# Patient Record
Sex: Male | Born: 1940
Health system: Southern US, Community
[De-identification: ages and names within clinical notes are randomized; demographics above are authoritative.]

## PROBLEM LIST (undated history)

## (undated) DIAGNOSIS — B029 Zoster without complications: Secondary | ICD-10-CM

## (undated) DIAGNOSIS — E039 Hypothyroidism, unspecified: Secondary | ICD-10-CM

## (undated) DIAGNOSIS — I10 Essential (primary) hypertension: Secondary | ICD-10-CM

## (undated) DIAGNOSIS — K449 Diaphragmatic hernia without obstruction or gangrene: Secondary | ICD-10-CM

## (undated) DIAGNOSIS — K219 Gastro-esophageal reflux disease without esophagitis: Secondary | ICD-10-CM

## (undated) DIAGNOSIS — K5792 Diverticulitis of intestine, part unspecified, without perforation or abscess without bleeding: Secondary | ICD-10-CM

## (undated) DIAGNOSIS — E119 Type 2 diabetes mellitus without complications: Secondary | ICD-10-CM

## (undated) DIAGNOSIS — M1990 Unspecified osteoarthritis, unspecified site: Secondary | ICD-10-CM

## (undated) DIAGNOSIS — M47816 Spondylosis without myelopathy or radiculopathy, lumbar region: Secondary | ICD-10-CM

## (undated) DIAGNOSIS — J189 Pneumonia, unspecified organism: Secondary | ICD-10-CM

## (undated) DIAGNOSIS — G839 Paralytic syndrome, unspecified: Secondary | ICD-10-CM

## (undated) DIAGNOSIS — Z87442 Personal history of urinary calculi: Secondary | ICD-10-CM

## (undated) HISTORY — DX: Unspecified osteoarthritis, unspecified site: M19.90

## (undated) HISTORY — PX: INGUINAL HERNIA REPAIR: SUR1180

## (undated) HISTORY — PX: SPINE SURGERY: SHX786

## (undated) HISTORY — DX: Zoster without complications: B02.9

## (undated) HISTORY — DX: Type 2 diabetes mellitus without complications: E11.9

## (undated) HISTORY — DX: Spondylosis without myelopathy or radiculopathy, lumbar region: M47.816

## (undated) HISTORY — DX: Gastro-esophageal reflux disease without esophagitis: K21.9

## (undated) HISTORY — DX: Diverticulitis of intestine, part unspecified, without perforation or abscess without bleeding: K57.92

## (undated) HISTORY — DX: Personal history of urinary calculi: Z87.442

## (undated) HISTORY — DX: Hypothyroidism, unspecified: E03.9

## (undated) HISTORY — DX: Diaphragmatic hernia without obstruction or gangrene: K44.9

---
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
## 1998-01-14 ENCOUNTER — Other Ambulatory Visit: Admission: RE | Admit: 1998-01-14 | Discharge: 1998-01-14 | Payer: Self-pay | Admitting: General Surgery

## 1998-10-04 HISTORY — PX: APPENDECTOMY: SHX54

## 1999-01-26 ENCOUNTER — Ambulatory Visit (HOSPITAL_COMMUNITY): Admission: RE | Admit: 1999-01-26 | Discharge: 1999-01-26 | Payer: Self-pay | Admitting: *Deleted

## 2009-05-23 ENCOUNTER — Inpatient Hospital Stay (HOSPITAL_COMMUNITY): Admission: RE | Admit: 2009-05-23 | Discharge: 2009-05-25 | Payer: Self-pay | Admitting: Neurosurgery

## 2009-06-05 ENCOUNTER — Ambulatory Visit: Payer: Self-pay | Admitting: Surgery

## 2009-06-05 ENCOUNTER — Ambulatory Visit: Admission: RE | Admit: 2009-06-05 | Discharge: 2009-06-05 | Payer: Self-pay | Admitting: Orthopedic Surgery

## 2009-06-05 ENCOUNTER — Encounter (INDEPENDENT_AMBULATORY_CARE_PROVIDER_SITE_OTHER): Payer: Self-pay | Admitting: Neurosurgery

## 2009-06-09 ENCOUNTER — Emergency Department (HOSPITAL_COMMUNITY): Admission: EM | Admit: 2009-06-09 | Discharge: 2009-06-09 | Payer: Self-pay | Admitting: Emergency Medicine

## 2009-06-13 ENCOUNTER — Inpatient Hospital Stay (HOSPITAL_COMMUNITY): Admission: AD | Admit: 2009-06-13 | Discharge: 2009-06-17 | Payer: Self-pay | Admitting: Neurosurgery

## 2010-10-26 ENCOUNTER — Encounter: Payer: Self-pay | Admitting: Neurosurgery

## 2011-01-08 LAB — BASIC METABOLIC PANEL
BUN: 10 mg/dL (ref 6–23)
CO2: 29 meq/L (ref 19–32)
Calcium: 8.9 mg/dL (ref 8.4–10.5)
Chloride: 100 meq/L (ref 96–112)
Creatinine, Ser: 0.73 mg/dL (ref 0.4–1.5)
GFR calc Af Amer: >60 mL/min (ref >60)
GFR calc non Af Amer: >60 mL/min (ref >60)
Glucose, Bld: 131 mg/dL — ABNORMAL HIGH (ref 70–99)
Potassium: 4.4 mEq/L (ref 3.5–5.1)
Sodium: 137 mEq/L (ref 135–145)

## 2011-01-08 LAB — CBC
HCT: 39.8 % (ref 39.0–52.0)
Hemoglobin: 13.9 g/dL (ref 13.0–17.0)
MCHC: 34.8 g/dL (ref 30.0–36.0)
MCV: 95.5 fL (ref 78.0–100.0)
Platelets: 156 10*3/uL (ref 150–400)
RBC: 4.17 MIL/uL — ABNORMAL LOW (ref 4.22–5.81)
RDW: 13.4 % (ref 11.5–15.5)
WBC: 5 10*3/uL (ref 4.0–10.5)

## 2011-01-08 LAB — GLUCOSE, CAPILLARY
Glucose-Capillary: 144 mg/dL — ABNORMAL HIGH (ref 70–99)
Glucose-Capillary: 171 mg/dL — ABNORMAL HIGH (ref 70–99)
Glucose-Capillary: 178 mg/dL — ABNORMAL HIGH (ref 70–99)
Glucose-Capillary: 180 mg/dL — ABNORMAL HIGH (ref 70–99)
Glucose-Capillary: 180 mg/dL — ABNORMAL HIGH (ref 70–99)
Glucose-Capillary: 186 mg/dL — ABNORMAL HIGH (ref 70–99)
Glucose-Capillary: 193 mg/dL — ABNORMAL HIGH (ref 70–99)
Glucose-Capillary: 201 mg/dL — ABNORMAL HIGH (ref 70–99)
Glucose-Capillary: 204 mg/dL — ABNORMAL HIGH (ref 70–99)
Glucose-Capillary: 207 mg/dL — ABNORMAL HIGH (ref 70–99)
Glucose-Capillary: 222 mg/dL — ABNORMAL HIGH (ref 70–99)
Glucose-Capillary: 234 mg/dL — ABNORMAL HIGH (ref 70–99)
Glucose-Capillary: 236 mg/dL — ABNORMAL HIGH (ref 70–99)
Glucose-Capillary: 243 mg/dL — ABNORMAL HIGH (ref 70–99)
Glucose-Capillary: 246 mg/dL — ABNORMAL HIGH (ref 70–99)
Glucose-Capillary: 248 mg/dL — ABNORMAL HIGH (ref 70–99)

## 2011-01-08 LAB — C-REACTIVE PROTEIN: CRP: 0.5 mg/dL — ABNORMAL LOW (ref <0.6)

## 2011-01-08 LAB — DIFFERENTIAL
Basophils Absolute: 0 10*3/uL (ref 0.0–0.1)
Basophils Relative: 0 % (ref 0–1)
Eosinophils Absolute: 0.2 10*3/uL (ref 0.0–0.7)
Eosinophils Relative: 3 % (ref 0–5)
Lymphocytes Relative: 19 % (ref 12–46)
Lymphs Abs: 0.9 10*3/uL (ref 0.7–4.0)
Monocytes Absolute: 0.5 10*3/uL (ref 0.1–1.0)
Monocytes Relative: 9 % (ref 3–12)
Neutro Abs: 3.4 10*3/uL (ref 1.7–7.7)
Neutrophils Relative %: 68 % (ref 43–77)

## 2011-01-08 LAB — URIC ACID: Uric Acid, Serum: 3.5 mg/dL — ABNORMAL LOW (ref 4.0–7.8)

## 2011-01-08 LAB — SEDIMENTATION RATE: Sed Rate: 7 mm/h (ref 0–16)

## 2011-01-09 LAB — GLUCOSE, CAPILLARY
Glucose-Capillary: 102 mg/dL — ABNORMAL HIGH (ref 70–99)
Glucose-Capillary: 112 mg/dL — ABNORMAL HIGH (ref 70–99)
Glucose-Capillary: 130 mg/dL — ABNORMAL HIGH (ref 70–99)
Glucose-Capillary: 141 mg/dL — ABNORMAL HIGH (ref 70–99)
Glucose-Capillary: 141 mg/dL — ABNORMAL HIGH (ref 70–99)
Glucose-Capillary: 147 mg/dL — ABNORMAL HIGH (ref 70–99)
Glucose-Capillary: 148 mg/dL — ABNORMAL HIGH (ref 70–99)
Glucose-Capillary: 156 mg/dL — ABNORMAL HIGH (ref 70–99)
Glucose-Capillary: 164 mg/dL — ABNORMAL HIGH (ref 70–99)
Glucose-Capillary: 171 mg/dL — ABNORMAL HIGH (ref 70–99)
Glucose-Capillary: 184 mg/dL — ABNORMAL HIGH (ref 70–99)

## 2011-01-09 LAB — CBC
HCT: 40.4 % (ref 39.0–52.0)
Hemoglobin: 14.2 g/dL (ref 13.0–17.0)
MCHC: 35.2 g/dL (ref 30.0–36.0)
MCV: 94.6 fL (ref 78.0–100.0)
Platelets: 174 10*3/uL (ref 150–400)
RBC: 4.28 MIL/uL (ref 4.22–5.81)
RDW: 13.8 % (ref 11.5–15.5)
WBC: 4.5 10*3/uL (ref 4.0–10.5)

## 2011-01-09 LAB — BASIC METABOLIC PANEL
BUN: 11 mg/dL (ref 6–23)
CO2: 27 mEq/L (ref 19–32)
Calcium: 9.3 mg/dL (ref 8.4–10.5)
Chloride: 106 mEq/L (ref 96–112)
Creatinine, Ser: 0.91 mg/dL (ref 0.4–1.5)
GFR calc Af Amer: >60 mL/min (ref >60)
GFR calc non Af Amer: >60 mL/min (ref >60)
Glucose, Bld: 104 mg/dL — ABNORMAL HIGH (ref 70–99)
Potassium: 4.7 mEq/L (ref 3.5–5.1)
Sodium: 140 meq/L (ref 135–145)

## 2011-02-12 IMAGING — CR DG CHEST 2V
2 series · 2 of 2 positions shown · non-contrast
Comparison: None

CLINICAL DATA: Preoperative respiratory exam for spinal surgery.
Hypertension.  Diabetes.

CHEST - 2 VIEW

[view not recorded (1 of 2)]
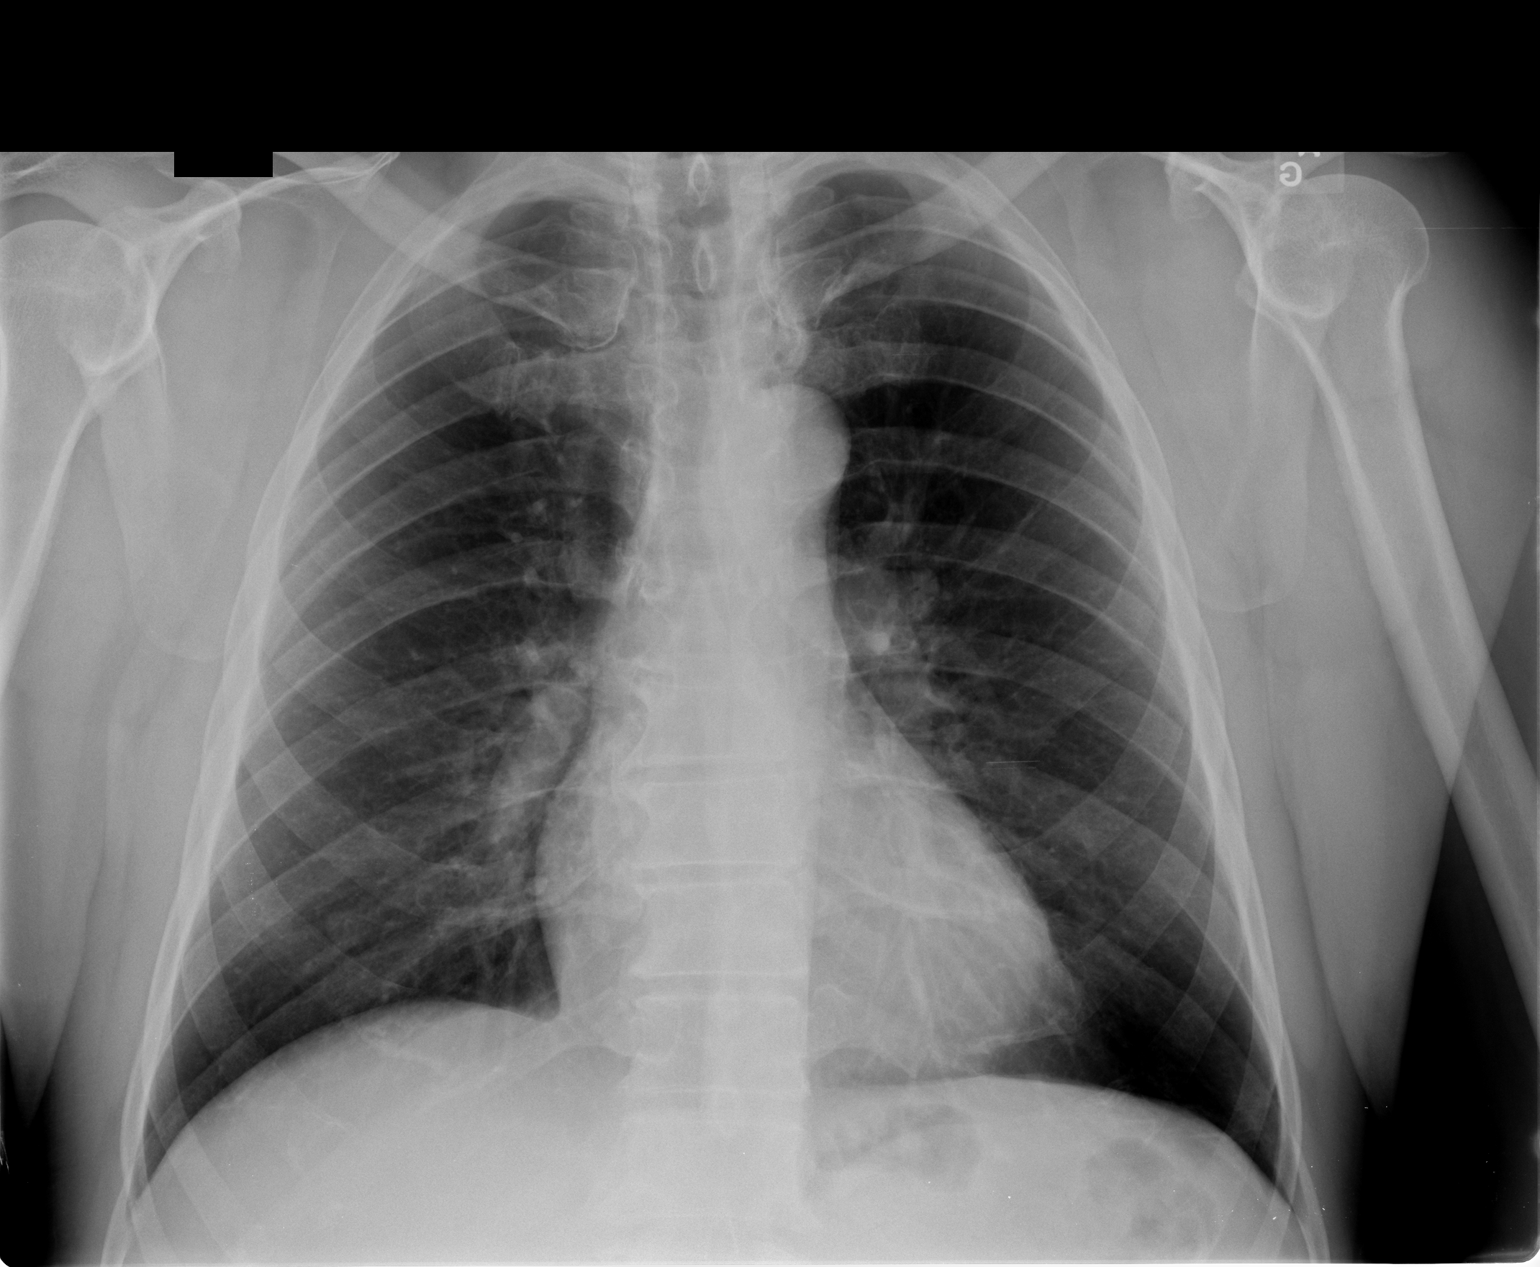

[view not recorded (2 of 2)]
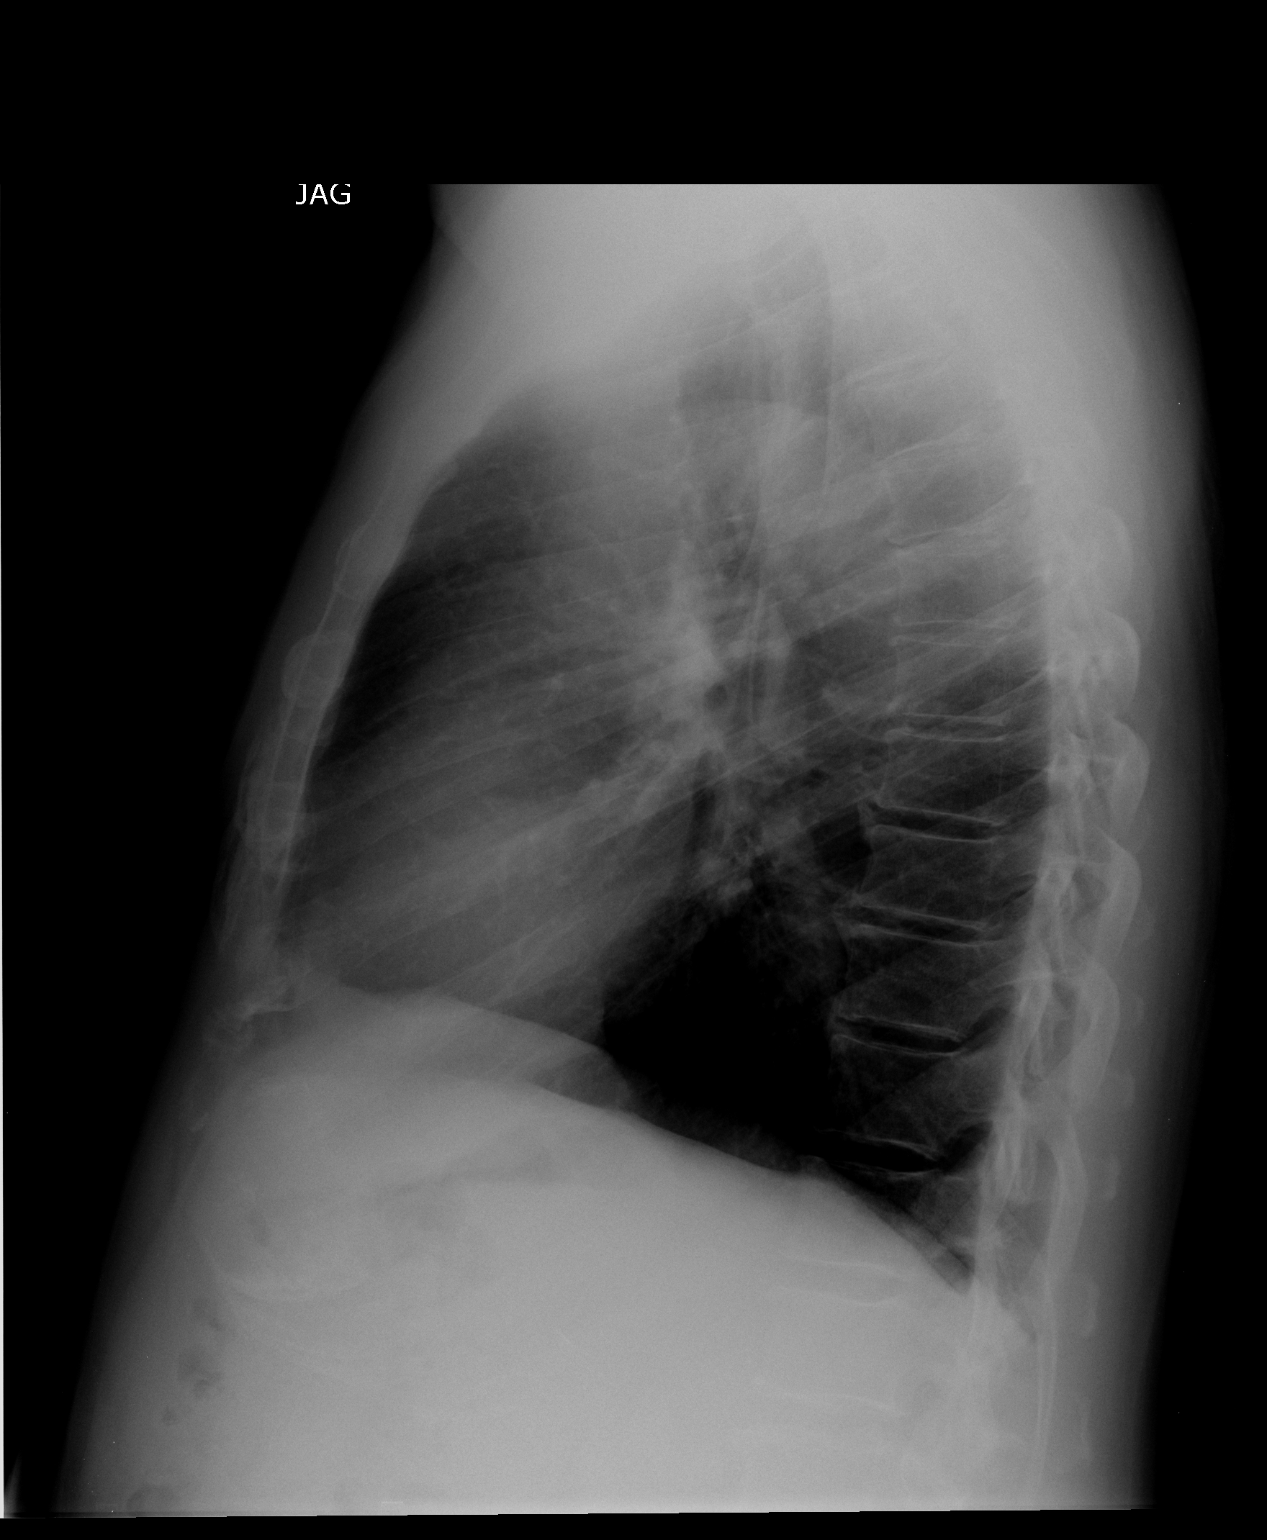

[2 of 2 positions shown; findings below may reference images not displayed]

FINDINGS: Heart size is normal.  The mediastinum is unremarkable.
Lungs are clear.  No effusions.  No significant bony finding.
IMPRESSION: Normal chest

## 2011-02-16 NOTE — Op Note (Signed)
NAMEKAELON, WEEKES NO.:  [id number]   MEDICAL RECORD NO.:  [id number]          PATIENT TYPE:  OIB   LOCATION:  3535                         FACILITY:  [facility]   PHYSICIAN:  Donalee Citrin, M.D.        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   PROCEDURE:  Lumbar laminectomy and microdiskectomy at L5-S1 right,  microscope dissection of the right S1 nerve root, and microscopic  discectomy.   SURGEON:  Donalee Citrin, MD   ASSISTANT:  Tia Alert, MD   ANESTHESIA:  General endotracheal.   HISTORY OF PRESENT ILLNESS:  The patient is a very pleasant 70 year old  gentleman who has had progressive worsening back and predominantly right  leg pain radiating down to the back of his leg consistent with S1 nerve  root pattern.  MRI scan showed severe spondylosis with facet arthropathy  and ruptured disk causing severe stenosis of the right S1 nerve root.  The patient failed all forms of conservative treatment and was  recommended laminectomy and microdiskectomy.  Risks and benefits of the  operation were explained to the patient.  He understood and agreed to  proceed forth.   OPERATIONS:  The patient was brought to the OR and induced under general  anesthesia, positioned prone on the Wilson frame.  Back was prepped and  draped in the routine sterile fashion.  Preop incision was localized at  the appropriate level.  After infiltration of 10 mL of lidocaine with  epinephrine, a midline incision was made.  A Bovie electrocautery was  used to take down the subcutaneous tissues and subperiosteal dissection  was carried down the lamina of L4 and L5 along the right.  Intraoperative x-ray confirmed localization at the appropriate level.  Then, a high-speed drill was used to  drill down the inferior aspect of  L5 medial facet complex and superior aspect of S1.  Then, using a 2-mm  Kerrison punch, laminotomy was completed with removing by the inferior  one-third of the L5 lamina, medial facet complex, superior one-third of  the S1 lamina.  At this point, ligamentum flavum was identified and  removed a piece of fascia exposed and thecal sac.  At this point, the  operating microscope was draped, brought into the field, and under  microscopic illumination, the undersurface of the medial gutter was then  underbitten to identify the lateral margin of the S1 nerve root.  The S1  pedicle was identified and the S1 disk space was identified.  Epidural  veins were coagulated.  The S1 nerve root was dissected off for a large  disk herniation displacing the S1 nerve root and this was incised with  #11-blade scalpel and this was radically cleaned out.  In this, there  was a marked arthropathy and facet arthropathy can causing marked  stenosis of the proximal S1 nerve root as well.  This was all  been  removed with 2-mm Kerrison punch decompressing the foramen.  At the end  of diskectomy and facetectomy, there was no further stenosis on the S1  nerve root.  The wound was copiously irrigated and meticulous hemostasis  was maintained.  Gelfoam was laid on top of the dura.  The muscle fascia  was reapproximated in layers  with interrupted Vicryl, and the skin was closed with a running 4-0  subcuticular.  Benzoin and Steri-Strips were applied.  The patient went  to the recovery room in stable condition.  At the end of the case,  sponge and instrument count were correct.           ______________________________  Donalee Citrin, M.D.     GC/MEDQ  D:  05/23/2009  T:  05/23/2009  Job:  [id number]

## 2011-03-01 ENCOUNTER — Emergency Department (HOSPITAL_COMMUNITY)
Admission: EM | Admit: 2011-03-01 | Discharge: 2011-03-01 | Disposition: A | Discharge status: Home / Self Care | Payer: Medicare Other | Attending: Emergency Medicine | Admitting: Emergency Medicine

## 2011-03-01 DIAGNOSIS — I10 Essential (primary) hypertension: Secondary | ICD-10-CM | POA: Insufficient documentation

## 2011-03-01 DIAGNOSIS — T148XXA Other injury of unspecified body region, initial encounter: Secondary | ICD-10-CM | POA: Insufficient documentation

## 2011-03-01 DIAGNOSIS — X500XXA Overexertion from strenuous movement or load, initial encounter: Secondary | ICD-10-CM | POA: Insufficient documentation

## 2011-03-01 DIAGNOSIS — IMO0001 Reserved for inherently not codable concepts without codable children: Secondary | ICD-10-CM | POA: Insufficient documentation

## 2011-03-01 DIAGNOSIS — E039 Hypothyroidism, unspecified: Secondary | ICD-10-CM | POA: Insufficient documentation

## 2014-07-04 ENCOUNTER — Encounter: Payer: Self-pay | Admitting: Family

## 2014-07-04 ENCOUNTER — Ambulatory Visit (INDEPENDENT_AMBULATORY_CARE_PROVIDER_SITE_OTHER): Payer: Medicare Other | Admitting: Family

## 2014-07-04 VITALS — BP 128/80 | HR 55 | Ht 67.0 in | Wt 161.0 lb

## 2014-07-04 DIAGNOSIS — M47816 Spondylosis without myelopathy or radiculopathy, lumbar region: Secondary | ICD-10-CM | POA: Insufficient documentation

## 2014-07-04 DIAGNOSIS — Z87442 Personal history of urinary calculi: Secondary | ICD-10-CM | POA: Insufficient documentation

## 2014-07-04 DIAGNOSIS — I10 Essential (primary) hypertension: Secondary | ICD-10-CM | POA: Insufficient documentation

## 2014-07-04 DIAGNOSIS — E039 Hypothyroidism, unspecified: Secondary | ICD-10-CM | POA: Insufficient documentation

## 2014-07-04 DIAGNOSIS — M4726 Other spondylosis with radiculopathy, lumbar region: Secondary | ICD-10-CM

## 2014-07-04 DIAGNOSIS — Z23 Encounter for immunization: Secondary | ICD-10-CM

## 2014-07-04 DIAGNOSIS — K5732 Diverticulitis of large intestine without perforation or abscess without bleeding: Secondary | ICD-10-CM | POA: Insufficient documentation

## 2014-07-04 DIAGNOSIS — E119 Type 2 diabetes mellitus without complications: Secondary | ICD-10-CM | POA: Insufficient documentation

## 2014-07-04 DIAGNOSIS — K219 Gastro-esophageal reflux disease without esophagitis: Secondary | ICD-10-CM | POA: Insufficient documentation

## 2014-07-04 DIAGNOSIS — K449 Diaphragmatic hernia without obstruction or gangrene: Secondary | ICD-10-CM | POA: Insufficient documentation

## 2014-07-04 DIAGNOSIS — B029 Zoster without complications: Secondary | ICD-10-CM

## 2014-07-04 DIAGNOSIS — K8020 Calculus of gallbladder without cholecystitis without obstruction: Secondary | ICD-10-CM | POA: Insufficient documentation

## 2014-07-04 NOTE — Patient Instructions (Signed)
Cardiac Diet This diet can help prevent heart disease and stroke. Many factors influence your heart health, including eating and exercise habits. Coronary risk rises a lot with abnormal blood fat (lipid) levels. Cardiac meal planning includes limiting unhealthy fats, increasing healthy fats, and making other small dietary changes. General guidelines are as follows:  Adjust calorie intake to reach and maintain desirable body weight.  Limit total fat intake to less than 30% of total calories. Saturated fat should be less than 7% of calories.  Saturated fats are found in animal products and in some vegetable products. Saturated vegetable fats are found in coconut oil, cocoa butter, palm oil, and palm kernel oil. Read labels carefully to avoid these products as much as possible. Use butter in moderation. Choose tub margarines and oils that have 2 grams of fat or less. Good cooking oils are canola and olive oils.  Practice low-fat cooking techniques. Do not fry food. Instead, broil, bake, boil, steam, grill, roast on a rack, stir-fry, or microwave it. Other fat reducing suggestions include:  Remove the skin from poultry.  Remove all visible fat from meats.  Skim the fat off stews, soups, and gravies before serving them.  Steam vegetables in water or broth instead of sauting them in fat.  Avoid foods with trans fat (or hydrogenated oils), such as commercially fried foods and commercially baked goods. Commercial shortening and deep-frying fats will contain trans fat.  Increase intake of fruits, vegetables, whole grains, and legumes to replace foods high in fat.  Increase consumption of nuts, legumes, and seeds to at least 4 servings weekly. One serving of a legume equals  cup, and 1 serving of nuts or seeds equals  cup.  Choose whole grains more often. Have 3 servings per day (a serving is 1 ounce [oz]).  Eat 4 to 5 servings of vegetables per day. A serving of vegetables is 1 cup of raw leafy  vegetables;  cup of raw or cooked cut-up vegetables;  cup of vegetable juice.  Eat 4 to 5 servings of fruit per day. A serving of fruit is 1 medium whole fruit;  cup of dried fruit;  cup of fresh, frozen, or canned fruit;  cup of 100% fruit juice.  Increase your intake of dietary fiber to 20 to 30 grams per day. Insoluble fiber may help lower your risk of heart disease and may help curb your appetite.  Soluble fiber binds cholesterol to be removed from the blood. Foods high in soluble fiber are dried beans, citrus fruits, oats, apples, bananas, broccoli, Brussels sprouts, and eggplant.  Try to include foods fortified with plant sterols or stanols, such as yogurt, breads, juices, or margarines. Choose several fortified foods to achieve a daily intake of 2 to 3 grams of plant sterols or stanols.  Foods with omega-3 fats can help reduce your risk of heart disease. Aim to have a 3.5 oz portion of fatty fish twice per week, such as salmon, mackerel, albacore tuna, sardines, lake trout, or herring. If you wish to take a fish oil supplement, choose one that contains 1 gram of both DHA and EPA.  Limit processed meats to 2 servings (3 oz portion) weekly.  Limit the sodium in your diet to 1500 milligrams (mg) per day. If you have high blood pressure, talk to a registered dietitian about a DASH (Dietary Approaches to Stop Hypertension) eating plan.  Limit sweets and beverages with added sugar, such as soda, to no more than 5 servings per week. One   serving is:   1 tablespoon sugar.  1 tablespoon jelly or jam.   cup sorbet.  1 cup lemonade.   cup regular soda. CHOOSING FOODS Starches  Allowed: Breads: All kinds (wheat, rye, raisin, white, oatmeal, Italian, French, and English muffin bread). Low-fat rolls: English muffins, frankfurter and hamburger buns, bagels, pita bread, tortillas (not fried). Pancakes, waffles, biscuits, and muffins made with recommended oil.  Avoid: Products made with  saturated or trans fats, oils, or whole milk products. Butter rolls, cheese breads, croissants. Commercial doughnuts, muffins, sweet rolls, biscuits, waffles, pancakes, store-bought mixes. Crackers  Allowed: Low-fat crackers and snacks: Animal, graham, rye, saltine (with recommended oil, no lard), oyster, and matzo crackers. Bread sticks, melba toast, rusks, flatbread, pretzels, and light popcorn.  Avoid: High-fat crackers: cheese crackers, butter crackers, and those made with coconut, palm oil, or trans fat (hydrogenated oils). Buttered popcorn. Cereals  Allowed: Hot or cold whole-grain cereals.  Avoid: Cereals containing coconut, hydrogenated vegetable fat, or animal fat. Potatoes / Pasta / Rice  Allowed: All kinds of potatoes, rice, and pasta (such as macaroni, spaghetti, and noodles).  Avoid: Pasta or rice prepared with cream sauce or high-fat cheese. Chow mein noodles, French fries. Vegetables  Allowed: All vegetables and vegetable juices.  Avoid: Fried vegetables. Vegetables in cream, butter, or high-fat cheese sauces. Limit coconut. Fruit in cream or custard. Protein  Allowed: Limit your intake of meat, seafood, and poultry to no more than 6 oz (cooked weight) per day. All lean, well-trimmed beef, veal, pork, and lamb. All chicken and turkey without skin. All fish and shellfish. Wild game: wild duck, rabbit, pheasant, and venison. Egg whites or low-cholesterol egg substitutes may be used as desired. Meatless dishes: recipes with dried beans, peas, lentils, and tofu (soybean curd). Seeds and nuts: all seeds and most nuts.  Avoid: Prime grade and other heavily marbled and fatty meats, such as short ribs, spare ribs, rib eye roast or steak, frankfurters, sausage, bacon, and high-fat luncheon meats, mutton. Caviar. Commercially fried fish. Domestic duck, goose, venison sausage. Organ meats: liver, gizzard, heart, chitterlings, brains, kidney, sweetbreads. Dairy  Allowed: Low-fat  cheeses: nonfat or low-fat cottage cheese (1% or 2% fat), cheeses made with part skim milk, such as mozzarella, farmers, string, or ricotta. (Cheeses should be labeled no more than 2 to 6 grams fat per oz.). Skim (or 1%) milk: liquid, powdered, or evaporated. Buttermilk made with low-fat milk. Drinks made with skim or low-fat milk or cocoa. Chocolate milk or cocoa made with skim or low-fat (1%) milk. Nonfat or low-fat yogurt.  Avoid: Whole milk cheeses, including colby, cheddar, muenster, Monterey Jack, Havarti, Brie, Camembert, American, Swiss, and blue. Creamed cottage cheese, cream cheese. Whole milk and whole milk products, including buttermilk or yogurt made from whole milk, drinks made from whole milk. Condensed milk, evaporated whole milk, and 2% milk. Soups and Combination Foods  Allowed: Low-fat low-sodium soups: broth, dehydrated soups, homemade broth, soups with the fat removed, homemade cream soups made with skim or low-fat milk. Low-fat spaghetti, lasagna, chili, and Spanish rice if low-fat ingredients and low-fat cooking techniques are used.  Avoid: Cream soups made with whole milk, cream, or high-fat cheese. All other soups. Desserts and Sweets  Allowed: Sherbet, fruit ices, gelatins, meringues, and angel food cake. Homemade desserts with recommended fats, oils, and milk products. Jam, jelly, honey, marmalade, sugars, and syrups. Pure sugar candy, such as gum drops, hard candy, jelly beans, marshmallows, mints, and small amounts of dark chocolate.  Avoid: Commercially prepared   cakes, pies, cookies, frosting, pudding, or mixes for these products. Desserts containing whole milk products, chocolate, coconut, lard, palm oil, or palm kernel oil. Ice cream or ice cream drinks. Candy that contains chocolate, coconut, butter, hydrogenated fat, or unknown ingredients. Buttered syrups. Fats and Oils  Allowed: Vegetable oils: safflower, sunflower, corn, soybean, cottonseed, sesame, canola, olive,  or peanut. Non-hydrogenated margarines. Salad dressing or mayonnaise: homemade or commercial, made with a recommended oil. Low or nonfat salad dressing or mayonnaise.  Limit added fats and oils to 6 to 8 tsp per day (includes fats used in cooking, baking, salads, and spreads on bread). Remember to count the "hidden fats" in foods.  Avoid: Solid fats and shortenings: butter, lard, salt pork, bacon drippings. Gravy containing meat fat, shortening, or suet. Cocoa butter, coconut. Coconut oil, palm oil, palm kernel oil, or hydrogenated oils: these ingredients are often used in bakery products, nondairy creamers, whipped toppings, candy, and commercially fried foods. Read labels carefully. Salad dressings made of unknown oils, sour cream, or cheese, such as blue cheese and Roquefort. Cream, all kinds: half-and-half, light, heavy, or whipping. Sour cream or cream cheese (even if "light" or low-fat). Nondairy cream substitutes: coffee creamers and sour cream substitutes made with palm, palm kernel, hydrogenated oils, or coconut oil. Beverages  Allowed: Coffee (regular or decaffeinated), tea. Diet carbonated beverages, mineral water. Alcohol: Check with your caregiver. Moderation is recommended.  Avoid: Whole milk, regular sodas, and juice drinks with added sugar. Condiments  Allowed: All seasonings and condiments. Cocoa powder. "Cream" sauces made with recommended ingredients.  Avoid: Carob powder made with hydrogenated fats. SAMPLE MENU Breakfast   cup orange juice   cup oatmeal  1 slice toast  1 tsp margarine  1 cup skim milk Lunch  Turkey sandwich with 2 oz turkey, 2 slices bread  Lettuce and tomato slices  Fresh fruit  Carrot sticks  Coffee or tea Snack  Fresh fruit or low-fat crackers Dinner  3 oz lean ground beef  1 baked potato  1 tsp margarine   cup asparagus  Lettuce salad  1 tbs non-creamy dressing   cup peach slices  1 cup skim milk Document Released:  06/29/2008 Document Revised: 03/21/2012 Document Reviewed: 11/20/2013 [employer] Patient Information 2015 [employer]. This information is not intended to replace advice given to you by your health care provider. Make sure you discuss any questions you have with your health care provider.  

## 2014-07-04 NOTE — Progress Notes (Signed)
Pre visit review using our clinic review tool, if applicable. No additional management support is needed unless otherwise documented below in the visit note. 

## 2014-07-04 NOTE — Progress Notes (Signed)
Subjective:    Patient ID: Abigail Miyamoto, male    DOB: 10-Jan-1941, 73 y.o.   MRN: [id number]  HPI 73 year old white male, nonsmoker, and the patient to the practices and to be established. He has a history of hypertension, hypothyroidism, type 2 diabetes, GERD, osteoarthritis of the lumbar spine, hiatal hernia, diverticulosis, cholelithiasis. He is currently stable on medications.  Patient sees the [place] annually.  He spent 4 years in the [employer]. Has been married 62 years.   Had a colonoscopy and endoscopy and 2014. Had an and and appendectomy 15 years ago.   Review of Systems  Constitutional: Negative.   HENT: Negative.   Respiratory: Negative.   Cardiovascular: Negative.   Gastrointestinal: Negative.   Endocrine: Negative.   Genitourinary: Negative.   Musculoskeletal: Negative.   Skin: Negative.   Allergic/Immunologic: Negative.   Neurological: Negative.   Hematological: Negative.   Psychiatric/Behavioral: Negative.    Past Medical History  Diagnosis Date  . Arthritis     History   Social History  . Marital Status: Married    Spouse Name: N/A    Number of Children: N/A  . Years of Education: N/A   Occupational History  . Not on file.   Social History Main Topics  . Smoking status: Never Smoker   . Smokeless tobacco: Not on file  . Alcohol Use: No  . Drug Use: No  . Sexual Activity: Not on file   Other Topics Concern  . Not on file   Social History Narrative  . No narrative on file    Past Surgical History  Procedure Laterality Date  . Appendectomy  2000    No family history on file.  No Known Allergies  No current outpatient prescriptions on file prior to visit.   No current facility-administered medications on file prior to visit.    BP 128/80  Pulse 55  Ht 5\' 7"  (1.702 m)  Wt 161 lb (73.029 kg)  BMI 25.21 kg/m2chart    Objective:   Physical Exam  Constitutional: He is oriented to person, place, and time. He appears  well-developed and well-nourished.  HENT:  Right Ear: External ear normal.  Left Ear: External ear normal.  Nose: Nose normal.  Mouth/Throat: Oropharynx is clear and moist.  Neck: Normal range of motion.  Cardiovascular: Normal rate, regular rhythm and normal heart sounds.   Pulmonary/Chest: Effort normal and breath sounds normal.  Abdominal: Soft. Bowel sounds are normal.  Musculoskeletal: Normal range of motion.  Neurological: He is alert and oriented to person, place, and time.  Skin: Skin is warm and dry.  Psychiatric: He has a normal mood and affect.          Assessment & Plan:  Quandarius was seen today for establish care.  Diagnoses and associated orders for this visit:  Essential hypertension, benign - Basic Metabolic Panel; Future - Hepatic Function Panel; Future - Lipid Panel; Future  Hypothyroidism, unspecified hypothyroidism type - TSH; Future - Hepatic Function Panel; Future - Lipid Panel; Future  Gastroesophageal reflux disease without esophagitis - Hepatic Function Panel; Future  Osteoarthritis of spine with radiculopathy, lumbar region  Type 2 diabetes mellitus without complication - Basic Metabolic Panel; Future - Hemoglobin A1c; Future - Lipid Panel; Future  Calculus of gallbladder without cholecystitis without obstruction - Hepatic Function Panel; Future  Herpes zoster  History of renal calculi  Diverticulitis of colon  Hiatal hernia - Hepatic Function Panel; Future    patient will return tomorrow  for fasting labs. Recheck in 4 months and sooner as needed. [facility].

## 2014-07-05 ENCOUNTER — Other Ambulatory Visit (INDEPENDENT_AMBULATORY_CARE_PROVIDER_SITE_OTHER): Payer: Medicare Other

## 2014-07-05 ENCOUNTER — Ambulatory Visit (INDEPENDENT_AMBULATORY_CARE_PROVIDER_SITE_OTHER): Payer: Medicare Other | Admitting: Family

## 2014-07-05 ENCOUNTER — Encounter: Payer: Self-pay | Admitting: Family

## 2014-07-05 ENCOUNTER — Telehealth: Payer: Self-pay | Admitting: Family

## 2014-07-05 DIAGNOSIS — K449 Diaphragmatic hernia without obstruction or gangrene: Secondary | ICD-10-CM

## 2014-07-05 DIAGNOSIS — K8020 Calculus of gallbladder without cholecystitis without obstruction: Secondary | ICD-10-CM

## 2014-07-05 DIAGNOSIS — H6123 Impacted cerumen, bilateral: Secondary | ICD-10-CM

## 2014-07-05 DIAGNOSIS — K219 Gastro-esophageal reflux disease without esophagitis: Secondary | ICD-10-CM

## 2014-07-05 DIAGNOSIS — I10 Essential (primary) hypertension: Secondary | ICD-10-CM

## 2014-07-05 DIAGNOSIS — E039 Hypothyroidism, unspecified: Secondary | ICD-10-CM

## 2014-07-05 DIAGNOSIS — E119 Type 2 diabetes mellitus without complications: Secondary | ICD-10-CM

## 2014-07-05 LAB — HEPATIC FUNCTION PANEL
ALT: 39 U/L (ref 0–53)
AST: 36 U/L (ref 0–37)
Albumin: 4.1 g/dL (ref 3.5–5.2)
Alkaline Phosphatase: 63 U/L (ref 39–117)
Bilirubin, Direct: 0.1 mg/dL (ref 0.0–0.3)
Total Bilirubin: 0.8 mg/dL (ref 0.2–1.2)
Total Protein: 7.4 g/dL (ref 6.0–8.3)

## 2014-07-05 LAB — BASIC METABOLIC PANEL
BUN: 14 mg/dL (ref 6–23)
CO2: 30 meq/L (ref 19–32)
Calcium: 9.1 mg/dL (ref 8.4–10.5)
Chloride: 104 mEq/L (ref 96–112)
Creatinine, Ser: 0.8 mg/dL (ref 0.4–1.5)
GFR: 101.45 mL/min (ref >60.00)
Glucose, Bld: 98 mg/dL (ref 70–99)
Potassium: 5.2 meq/L — ABNORMAL HIGH (ref 3.5–5.1)
Sodium: 139 mEq/L (ref 135–145)

## 2014-07-05 LAB — HEMOGLOBIN A1C: Hgb A1c MFr Bld: 6.2 % (ref 4.6–6.5)

## 2014-07-05 LAB — LIPID PANEL
Cholesterol: 190 mg/dL (ref 0–200)
HDL: 37.8 mg/dL — ABNORMAL LOW (ref >39.00)
LDL Cholesterol: 125 mg/dL — ABNORMAL HIGH (ref 0–99)
NonHDL: 152.2
Total CHOL/HDL Ratio: 5
Triglycerides: 135 mg/dL (ref 0.0–149.0)
VLDL: 27 mg/dL (ref 0.0–40.0)

## 2014-07-05 LAB — TSH: TSH: 1.42 u[IU]/mL (ref 0.35–4.50)

## 2014-07-05 NOTE — Telephone Encounter (Signed)
emmi emailed °

## 2014-07-05 NOTE — Progress Notes (Signed)
   Subjective:    Patient ID: Todd Watson, male    DOB: 06/15/1941, 73 y.o.   MRN: [id number]  HPI 73 year old in for ear lavage.    Review of Systems  HENT:       Bilateral cerumen impaction   Past Medical History  Diagnosis Date  . Arthritis     History   Social History  . Marital Status: Married    Spouse Name: N/A    Number of Children: N/A  . Years of Education: N/A   Occupational History  . Not on file.   Social History Main Topics  . Smoking status: Never Smoker   . Smokeless tobacco: Not on file  . Alcohol Use: No  . Drug Use: No  . Sexual Activity: Not on file   Other Topics Concern  . Not on file   Social History Narrative  . No narrative on file    Past Surgical History  Procedure Laterality Date  . Appendectomy  2000    No family history on file.  No Known Allergies  Current Outpatient Prescriptions on File Prior to Visit  Medication Sig Dispense Refill  . aspirin 81 MG tablet Take 81 mg by mouth daily.      . glucosamine-chondroitin 500-400 MG tablet Take 1 tablet by mouth daily.      . levothyroxine (SYNTHROID, LEVOTHROID) 112 MCG tablet Take 112 mcg by mouth daily before breakfast.      . lisinopril (PRINIVIL,ZESTRIL) 10 MG tablet Take 10 mg by mouth daily.      . Multiple Vitamin (MULTIVITAMIN) tablet Take 1 tablet by mouth daily.      . naproxen sodium (ANAPROX) 220 MG tablet Take 220 mg by mouth as needed.      . Omega-3 Fatty Acids (FISH OIL) 1200 MG CAPS Take by mouth.      . omeprazole (PRILOSEC) 40 MG capsule Take 40 mg by mouth daily.      . Probiotic Product (PROBIOTIC DAILY PO) Take by mouth.      . vitamin C (ASCORBIC ACID) 500 MG tablet Take 500 mg by mouth daily.       No current facility-administered medications on file prior to visit.    There were no vitals taken for this visit.chart    Objective:   Physical Exam   Informed consent was obtained and peroxide gel was inserted into the ears bilaterally using the  lavage kit the ears were lavaged until clean.Inspection with a cerumen spoon removed residual wax. Patient tolerated the procedure well.     Assessment & Plan:  Todd Watson was seen today for no specified reason.  Diagnoses and associated orders for this visit:  Cerumen impaction, bilateral   Call the office with any questions or concerns.  

## 2014-07-08 ENCOUNTER — Telehealth: Payer: Self-pay | Admitting: Family

## 2014-07-08 NOTE — Telephone Encounter (Signed)
emmi emailed °

## 2014-07-23 ENCOUNTER — Encounter: Payer: Self-pay | Admitting: Family

## 2014-07-23 ENCOUNTER — Ambulatory Visit (INDEPENDENT_AMBULATORY_CARE_PROVIDER_SITE_OTHER): Payer: Medicare Other | Admitting: Family

## 2014-07-23 VITALS — BP 110/70 | HR 59 | Ht 67.0 in | Wt 160.0 lb

## 2014-07-23 DIAGNOSIS — M25511 Pain in right shoulder: Secondary | ICD-10-CM

## 2014-07-23 DIAGNOSIS — I10 Essential (primary) hypertension: Secondary | ICD-10-CM

## 2014-07-23 DIAGNOSIS — M25512 Pain in left shoulder: Secondary | ICD-10-CM

## 2014-07-23 MED ORDER — CYCLOBENZAPRINE HCL 5 MG PO TABS: 5.0000 mg | ORAL_TABLET | Freq: Three times a day (TID) | ORAL | Status: DC | PRN

## 2014-07-23 NOTE — Progress Notes (Signed)
Subjective:    Patient ID: Todd Watson, male    DOB: 11/28/1940, 73 y.o.   MRN: [id number]  HPI 73 year old white male with a history of hypertension hypothyroidism, and GERD is in today with bilateral upper arm and shoulder pain x2-3 weeks. Reports sawing and will it and chopping wood on a daily basis except Sundays. Rates the pain as 7/10, worse with movement and with raising his arms. Aleve helps and horse lemon.    Review of Systems  Constitutional: Negative.   HENT: Negative.   Respiratory: Negative.   Cardiovascular: Negative.   Gastrointestinal: Negative.   Endocrine: Negative.   Genitourinary: Negative.   Musculoskeletal: Negative.   Skin: Negative.   Allergic/Immunologic: Negative.   Neurological: Negative.   Psychiatric/Behavioral: Negative.      Past Medical History  Diagnosis Date  . Arthritis     History   Social History  . Marital Status: Married    Spouse Name: N/A    Number of Children: N/A  . Years of Education: N/A   Occupational History  . Not on file.   Social History Main Topics  . Smoking status: Never Smoker   . Smokeless tobacco: Not on file  . Alcohol Use: No  . Drug Use: No  . Sexual Activity: Not on file   Other Topics Concern  . Not on file   Social History Narrative  . No narrative on file    Past Surgical History  Procedure Laterality Date  . Appendectomy  2000    No family history on file.  No Known Allergies  Current Outpatient Prescriptions on File Prior to Visit  Medication Sig Dispense Refill  . aspirin 81 MG tablet Take 81 mg by mouth daily.      Marland Kitchen glucosamine-chondroitin 500-400 MG tablet Take 1 tablet by mouth daily.      Marland Kitchen levothyroxine (SYNTHROID, LEVOTHROID) 112 MCG tablet Take 112 mcg by mouth daily before breakfast.      . lisinopril (PRINIVIL,ZESTRIL) 10 MG tablet Take 10 mg by mouth daily.      . Multiple Vitamin (MULTIVITAMIN) tablet Take 1 tablet by mouth daily.      . naproxen sodium (ANAPROX) 220  MG tablet Take 220 mg by mouth as needed.      . Omega-3 Fatty Acids (FISH OIL) 1200 MG CAPS Take by mouth.      Marland Kitchen omeprazole (PRILOSEC) 40 MG capsule Take 40 mg by mouth daily.      . Probiotic Product (PROBIOTIC DAILY PO) Take by mouth.      . vitamin C (ASCORBIC ACID) 500 MG tablet Take 500 mg by mouth daily.       No current facility-administered medications on file prior to visit.    BP 110/70  Pulse 59  Ht 5\' 7"  (1.702 m)  Wt 160 lb (72.576 kg)  BMI 25.05 kg/m2chart    Objective:   Physical Exam  Constitutional: He is oriented to person, place, and time. He appears well-developed and well-nourished.  HENT:  Right Ear: External ear normal.  Left Ear: External ear normal.  Mouth/Throat: Oropharynx is clear and moist.  Neck: Normal range of motion. Neck supple. No thyromegaly present.  Cardiovascular: Normal rate, regular rhythm and normal heart sounds.   Pulmonary/Chest: Effort normal and breath sounds normal.  Abdominal: Soft. Bowel sounds are normal.  Musculoskeletal: Normal range of motion.  Neurological: He is alert and oriented to person, place, and time.  Skin: Skin is warm and dry.  Psychiatric: He has a normal mood and affect.          Assessment & Plan:  Jeshua was seen today for no specified reason.  Diagnoses and associated orders for this visit:  Bilateral shoulder pain  Essential hypertension  Other Orders - cyclobenzaprine (FLEXERIL) 5 MG tablet; Take 1 tablet (5 mg total) by mouth 3 (three) times daily as needed for muscle spasms.   Call the office with any questions or concerns. Recheck as scheduled and as needed.

## 2014-07-23 NOTE — Progress Notes (Signed)
Pre visit review using our clinic review tool, if applicable. No additional management support is needed unless otherwise documented below in the visit note. 

## 2014-07-23 NOTE — Patient Instructions (Signed)

## 2014-08-07 ENCOUNTER — Ambulatory Visit (INDEPENDENT_AMBULATORY_CARE_PROVIDER_SITE_OTHER)
Admission: RE | Admit: 2014-08-07 | Discharge: 2014-08-07 | Disposition: A | Discharge status: Home / Self Care | Payer: Medicare Other | Source: Ambulatory Visit | Attending: Family | Admitting: Family

## 2014-08-07 ENCOUNTER — Telehealth: Payer: Self-pay | Admitting: Family

## 2014-08-07 DIAGNOSIS — M79601 Pain in right arm: Secondary | ICD-10-CM

## 2014-08-07 DIAGNOSIS — M79602 Pain in left arm: Principal | ICD-10-CM

## 2014-08-07 MED ORDER — PREDNISONE 20 MG PO TABS: 60.0000 mg | ORAL_TABLET | Freq: Every day | ORAL | Status: AC

## 2014-08-07 NOTE — Telephone Encounter (Signed)
I am sending in prednisone to help with pain but I would like for his to go to [facility] to get an xray of the c-spine. Please advise patient

## 2014-08-07 NOTE — Telephone Encounter (Signed)
Called and spoke with pt and pt is aware.  

## 2014-08-07 NOTE — Telephone Encounter (Signed)
Pt calling back - says he missed a call. Please try him again.

## 2014-08-07 NOTE — Telephone Encounter (Signed)
Pt was seen oct 20 for shoulder pain and still having pain. Pt stated pain med is not working. Pt has tried put hot and cold packs on shoulder. Please advise

## 2015-01-06 ENCOUNTER — Ambulatory Visit: Payer: BLUE CROSS/BLUE SHIELD | Admitting: Family

## 2015-01-09 ENCOUNTER — Encounter: Payer: Self-pay | Admitting: Family

## 2015-01-09 ENCOUNTER — Ambulatory Visit (INDEPENDENT_AMBULATORY_CARE_PROVIDER_SITE_OTHER): Payer: Commercial Managed Care - HMO | Admitting: Family

## 2015-01-09 VITALS — BP 140/90 | HR 53 | Temp 97.5°F | Ht 67.0 in | Wt 167.4 lb

## 2015-01-09 DIAGNOSIS — I10 Essential (primary) hypertension: Secondary | ICD-10-CM

## 2015-01-09 DIAGNOSIS — H6123 Impacted cerumen, bilateral: Secondary | ICD-10-CM

## 2015-01-09 DIAGNOSIS — K219 Gastro-esophageal reflux disease without esophagitis: Secondary | ICD-10-CM

## 2015-01-09 DIAGNOSIS — E039 Hypothyroidism, unspecified: Secondary | ICD-10-CM

## 2015-01-09 LAB — TSH: TSH: 32.2 u[IU]/mL — ABNORMAL HIGH (ref 0.35–4.50)

## 2015-01-09 LAB — HEPATIC FUNCTION PANEL
ALT: 30 U/L (ref 0–53)
AST: 34 U/L (ref 0–37)
Albumin: 4.4 g/dL (ref 3.5–5.2)
Alkaline Phosphatase: 55 U/L (ref 39–117)
Bilirubin, Direct: 0.1 mg/dL (ref 0.0–0.3)
Total Bilirubin: 0.6 mg/dL (ref 0.2–1.2)
Total Protein: 7.1 g/dL (ref 6.0–8.3)

## 2015-01-09 LAB — BASIC METABOLIC PANEL
BUN: 16 mg/dL (ref 6–23)
CO2: 28 mEq/L (ref 19–32)
Calcium: 9.3 mg/dL (ref 8.4–10.5)
Chloride: 103 meq/L (ref 96–112)
Creatinine, Ser: 0.97 mg/dL (ref 0.40–1.50)
GFR: 79.94 mL/min (ref >60.00)
Glucose, Bld: 122 mg/dL — ABNORMAL HIGH (ref 70–99)
Potassium: 4.4 mEq/L (ref 3.5–5.1)
Sodium: 137 meq/L (ref 135–145)

## 2015-01-09 NOTE — Patient Instructions (Signed)
Cardiac Diet This diet can help prevent heart disease and stroke. Many factors influence your heart health, including eating and exercise habits. Coronary risk rises a lot with abnormal blood fat (lipid) levels. Cardiac meal planning includes limiting unhealthy fats, increasing healthy fats, and making other small dietary changes. General guidelines are as follows:  Adjust calorie intake to reach and maintain desirable body weight.  Limit total fat intake to less than 30% of total calories. Saturated fat should be less than 7% of calories.  Saturated fats are found in animal products and in some vegetable products. Saturated vegetable fats are found in coconut oil, cocoa butter, palm oil, and palm kernel oil. Read labels carefully to avoid these products as much as possible. Use butter in moderation. Choose tub margarines and oils that have 2 grams of fat or less. Good cooking oils are canola and olive oils.  Practice low-fat cooking techniques. Do not fry food. Instead, broil, bake, boil, steam, grill, roast on a rack, stir-fry, or microwave it. Other fat reducing suggestions include:  Remove the skin from poultry.  Remove all visible fat from meats.  Skim the fat off stews, soups, and gravies before serving them.  Steam vegetables in water or broth instead of sauting them in fat.  Avoid foods with trans fat (or hydrogenated oils), such as commercially fried foods and commercially baked goods. Commercial shortening and deep-frying fats will contain trans fat.  Increase intake of fruits, vegetables, whole grains, and legumes to replace foods high in fat.  Increase consumption of nuts, legumes, and seeds to at least 4 servings weekly. One serving of a legume equals  cup, and 1 serving of nuts or seeds equals  cup.  Choose whole grains more often. Have 3 servings per day (a serving is 1 ounce [oz]).  Eat 4 to 5 servings of vegetables per day. A serving of vegetables is 1 cup of raw leafy  vegetables;  cup of raw or cooked cut-up vegetables;  cup of vegetable juice.  Eat 4 to 5 servings of fruit per day. A serving of fruit is 1 medium whole fruit;  cup of dried fruit;  cup of fresh, frozen, or canned fruit;  cup of 100% fruit juice.  Increase your intake of dietary fiber to 20 to 30 grams per day. Insoluble fiber may help lower your risk of heart disease and may help curb your appetite.  Soluble fiber binds cholesterol to be removed from the blood. Foods high in soluble fiber are dried beans, citrus fruits, oats, apples, bananas, broccoli, Brussels sprouts, and eggplant.  Try to include foods fortified with plant sterols or stanols, such as yogurt, breads, juices, or margarines. Choose several fortified foods to achieve a daily intake of 2 to 3 grams of plant sterols or stanols.  Foods with omega-3 fats can help reduce your risk of heart disease. Aim to have a 3.5 oz portion of fatty fish twice per week, such as salmon, mackerel, albacore tuna, sardines, lake trout, or herring. If you wish to take a fish oil supplement, choose one that contains 1 gram of both DHA and EPA.  Limit processed meats to 2 servings (3 oz portion) weekly.  Limit the sodium in your diet to 1500 milligrams (mg) per day. If you have high blood pressure, talk to a registered dietitian about a DASH (Dietary Approaches to Stop Hypertension) eating plan.  Limit sweets and beverages with added sugar, such as soda, to no more than 5 servings per week. One   serving is:   1 tablespoon sugar.  1 tablespoon jelly or jam.   cup sorbet.  1 cup lemonade.   cup regular soda. CHOOSING FOODS Starches  Allowed: Breads: All kinds (wheat, rye, raisin, white, oatmeal, Italian, French, and English muffin bread). Low-fat rolls: English muffins, frankfurter and hamburger buns, bagels, pita bread, tortillas (not fried). Pancakes, waffles, biscuits, and muffins made with recommended oil.  Avoid: Products made with  saturated or trans fats, oils, or whole milk products. Butter rolls, cheese breads, croissants. Commercial doughnuts, muffins, sweet rolls, biscuits, waffles, pancakes, store-bought mixes. Crackers  Allowed: Low-fat crackers and snacks: Animal, graham, rye, saltine (with recommended oil, no lard), oyster, and matzo crackers. Bread sticks, melba toast, rusks, flatbread, pretzels, and light popcorn.  Avoid: High-fat crackers: cheese crackers, butter crackers, and those made with coconut, palm oil, or trans fat (hydrogenated oils). Buttered popcorn. Cereals  Allowed: Hot or cold whole-grain cereals.  Avoid: Cereals containing coconut, hydrogenated vegetable fat, or animal fat. Potatoes / Pasta / Rice  Allowed: All kinds of potatoes, rice, and pasta (such as macaroni, spaghetti, and noodles).  Avoid: Pasta or rice prepared with cream sauce or high-fat cheese. Chow mein noodles, French fries. Vegetables  Allowed: All vegetables and vegetable juices.  Avoid: Fried vegetables. Vegetables in cream, butter, or high-fat cheese sauces. Limit coconut. Fruit in cream or custard. Protein  Allowed: Limit your intake of meat, seafood, and poultry to no more than 6 oz (cooked weight) per day. All lean, well-trimmed beef, veal, pork, and lamb. All chicken and turkey without skin. All fish and shellfish. Wild game: wild duck, rabbit, pheasant, and venison. Egg whites or low-cholesterol egg substitutes may be used as desired. Meatless dishes: recipes with dried beans, peas, lentils, and tofu (soybean curd). Seeds and nuts: all seeds and most nuts.  Avoid: Prime grade and other heavily marbled and fatty meats, such as short ribs, spare ribs, rib eye roast or steak, frankfurters, sausage, bacon, and high-fat luncheon meats, mutton. Caviar. Commercially fried fish. Domestic duck, goose, venison sausage. Organ meats: liver, gizzard, heart, chitterlings, brains, kidney, sweetbreads. Dairy  Allowed: Low-fat  cheeses: nonfat or low-fat cottage cheese (1% or 2% fat), cheeses made with part skim milk, such as mozzarella, farmers, string, or ricotta. (Cheeses should be labeled no more than 2 to 6 grams fat per oz.). Skim (or 1%) milk: liquid, powdered, or evaporated. Buttermilk made with low-fat milk. Drinks made with skim or low-fat milk or cocoa. Chocolate milk or cocoa made with skim or low-fat (1%) milk. Nonfat or low-fat yogurt.  Avoid: Whole milk cheeses, including colby, cheddar, muenster, Monterey Jack, Havarti, Brie, Camembert, American, Swiss, and blue. Creamed cottage cheese, cream cheese. Whole milk and whole milk products, including buttermilk or yogurt made from whole milk, drinks made from whole milk. Condensed milk, evaporated whole milk, and 2% milk. Soups and Combination Foods  Allowed: Low-fat low-sodium soups: broth, dehydrated soups, homemade broth, soups with the fat removed, homemade cream soups made with skim or low-fat milk. Low-fat spaghetti, lasagna, chili, and Spanish rice if low-fat ingredients and low-fat cooking techniques are used.  Avoid: Cream soups made with whole milk, cream, or high-fat cheese. All other soups. Desserts and Sweets  Allowed: Sherbet, fruit ices, gelatins, meringues, and angel food cake. Homemade desserts with recommended fats, oils, and milk products. Jam, jelly, honey, marmalade, sugars, and syrups. Pure sugar candy, such as gum drops, hard candy, jelly beans, marshmallows, mints, and small amounts of dark chocolate.  Avoid: Commercially prepared   cakes, pies, cookies, frosting, pudding, or mixes for these products. Desserts containing whole milk products, chocolate, coconut, lard, palm oil, or palm kernel oil. Ice cream or ice cream drinks. Candy that contains chocolate, coconut, butter, hydrogenated fat, or unknown ingredients. Buttered syrups. Fats and Oils  Allowed: Vegetable oils: safflower, sunflower, corn, soybean, cottonseed, sesame, canola, olive,  or peanut. Non-hydrogenated margarines. Salad dressing or mayonnaise: homemade or commercial, made with a recommended oil. Low or nonfat salad dressing or mayonnaise.  Limit added fats and oils to 6 to 8 tsp per day (includes fats used in cooking, baking, salads, and spreads on bread). Remember to count the "hidden fats" in foods.  Avoid: Solid fats and shortenings: butter, lard, salt pork, bacon drippings. Gravy containing meat fat, shortening, or suet. Cocoa butter, coconut. Coconut oil, palm oil, palm kernel oil, or hydrogenated oils: these ingredients are often used in bakery products, nondairy creamers, whipped toppings, candy, and commercially fried foods. Read labels carefully. Salad dressings made of unknown oils, sour cream, or cheese, such as blue cheese and Roquefort. Cream, all kinds: half-and-half, light, heavy, or whipping. Sour cream or cream cheese (even if "light" or low-fat). Nondairy cream substitutes: coffee creamers and sour cream substitutes made with palm, palm kernel, hydrogenated oils, or coconut oil. Beverages  Allowed: Coffee (regular or decaffeinated), tea. Diet carbonated beverages, mineral water. Alcohol: Check with your caregiver. Moderation is recommended.  Avoid: Whole milk, regular sodas, and juice drinks with added sugar. Condiments  Allowed: All seasonings and condiments. Cocoa powder. "Cream" sauces made with recommended ingredients.  Avoid: Carob powder made with hydrogenated fats. SAMPLE MENU Breakfast   cup orange juice   cup oatmeal  1 slice toast  1 tsp margarine  1 cup skim milk Lunch  Turkey sandwich with 2 oz turkey, 2 slices bread  Lettuce and tomato slices  Fresh fruit  Carrot sticks  Coffee or tea Snack  Fresh fruit or low-fat crackers Dinner  3 oz lean ground beef  1 baked potato  1 tsp margarine   cup asparagus  Lettuce salad  1 tbs non-creamy dressing   cup peach slices  1 cup skim milk Document Released:  06/29/2008 Document Revised: 03/21/2012 Document Reviewed: 11/20/2013 [employer] Patient Information 2015 [employer]. This information is not intended to replace advice given to you by your health care provider. Make sure you discuss any questions you have with your health care provider.  

## 2015-01-09 NOTE — Progress Notes (Signed)
Pre visit review using our clinic review tool, if applicable. No additional management support is needed unless otherwise documented below in the visit note. 

## 2015-01-09 NOTE — Progress Notes (Signed)
Subjective:    Patient ID: Todd Watson, male    DOB: 1941-08-06, 74 y.o.   MRN: [id number]  HPI 74 year old white male, nonsmoker, with a history of GERD, hypertension, hypothyroidism is in today for recheck. He is also under the care of the [place]. Gets his medications through them. Is concerned that his ears are clogged with wax. He chronically has an issue with cerumen impaction. Has been in [place] about last 3 months fishing. No medical concerns today.  Review of Systems  Constitutional: Negative.   HENT: Negative.   Respiratory: Negative.   Cardiovascular: Negative.   Gastrointestinal: Negative.   Endocrine: Negative.   Genitourinary: Negative.   Musculoskeletal: Negative.   Skin: Negative.   Allergic/Immunologic: Negative.   Neurological: Negative.   Hematological: Negative.   Psychiatric/Behavioral: Negative.    Past Medical History  Diagnosis Date  . Arthritis     History   Social History  . Marital Status: Married    Spouse Name: N/A  . Number of Children: N/A  . Years of Education: N/A   Occupational History  . Not on file.   Social History Main Topics  . Smoking status: Never Smoker   . Smokeless tobacco: Not on file  . Alcohol Use: No  . Drug Use: No  . Sexual Activity: Not on file   Other Topics Concern  . Not on file   Social History Narrative    Past Surgical History  Procedure Laterality Date  . Appendectomy  2000    No family history on file.  No Known Allergies  Current Outpatient Prescriptions on File Prior to Visit  Medication Sig Dispense Refill  . aspirin 81 MG tablet Take 81 mg by mouth daily.    Marland Kitchen glucosamine-chondroitin 500-400 MG tablet Take 1 tablet by mouth daily.    Marland Kitchen levothyroxine (SYNTHROID, LEVOTHROID) 112 MCG tablet Take 112 mcg by mouth daily before breakfast.    . lisinopril (PRINIVIL,ZESTRIL) 10 MG tablet Take 10 mg by mouth daily.    . Multiple Vitamin (MULTIVITAMIN) tablet Take 1 tablet by mouth daily.    .  Omega-3 Fatty Acids (FISH OIL) 1200 MG CAPS Take by mouth.    Marland Kitchen omeprazole (PRILOSEC) 40 MG capsule Take 40 mg by mouth daily.    . Probiotic Product (PROBIOTIC DAILY PO) Take by mouth.    . vitamin C (ASCORBIC ACID) 500 MG tablet Take 500 mg by mouth daily.    . cyclobenzaprine (FLEXERIL) 5 MG tablet Take 1 tablet (5 mg total) by mouth 3 (three) times daily as needed for muscle spasms. (Patient not taking: Reported on 01/09/2015) 30 tablet 0  . naproxen sodium (ANAPROX) 220 MG tablet Take 220 mg by mouth as needed.     No current facility-administered medications on file prior to visit.    BP 140/90 mmHg  Pulse 53  Temp(Src) 97.5 F (36.4 C) (Oral)  Ht 5' 7" (1.702 m)  Wt 167 lb 6.4 oz (75.932 kg)  BMI 26.21 kg/m2chart     Objective:   Physical Exam  Constitutional: He is oriented to person, place, and time. He appears well-developed and well-nourished.  HENT:  Nose: Nose normal.  Mouth/Throat: Oropharynx is clear and moist.  Bilateral cerumen impaction  Neck: Normal range of motion. Neck supple.  Cardiovascular: Normal rate, regular rhythm and normal heart sounds.   Pulmonary/Chest: Effort normal and breath sounds normal.  Abdominal: Soft. Bowel sounds are normal.  Musculoskeletal: Normal range of motion.  Neurological:  He is alert and oriented to person, place, and time.  Skin: Skin is warm and dry.  Psychiatric: He has a normal mood and affect.     Informed consent was obtained and peroxide gel was inserted into the ears bilaterally using the lavage kit the ears were lavaged until clean.Inspection with a cerumen spoon removed residual wax. Patient tolerated the procedure well.      Assessment & Plan:  Todd Watson was seen today for follow-up.  Diagnoses and all orders for this visit:  Hypothyroidism, unspecified hypothyroidism type Orders: -     TSH -     Basic Metabolic Panel -     Hepatic Function Panel  Essential hypertension Orders: -     Basic Metabolic Panel -      Hepatic Function Panel  Gastroesophageal reflux disease without esophagitis  Cerumen impaction, bilateral   Encouraged healthy diet and exercise. Recheck here in 6 months. [facility]

## 2015-01-10 ENCOUNTER — Telehealth: Payer: Self-pay | Admitting: Family

## 2015-01-10 NOTE — Telephone Encounter (Signed)
Pt has not been taking his synthroid med. Pt has 0.112  Mcg from [place]. Pt would like to know if its ok to take 0.112 mcg.

## 2015-01-10 NOTE — Telephone Encounter (Signed)
See results note. 

## 2015-03-11 ENCOUNTER — Telehealth: Payer: Self-pay | Admitting: Family

## 2015-03-11 MED ORDER — LEVOTHYROXINE SODIUM 112 MCG PO TABS: 112.0000 ug | ORAL_TABLET | Freq: Every day | ORAL | Status: DC

## 2015-03-11 NOTE — Telephone Encounter (Signed)
Patient is out of town and forgot his  levothyroxine (SYNTHROID, LEVOTHROID) 112 MCG tablet at home.  Patient would like to know if you can call in 10 pills to [facility], [address], [place], [place] [address]  [phone]) [phone].

## 2015-03-11 NOTE — Telephone Encounter (Signed)
Done

## 2015-03-24 ENCOUNTER — Encounter: Payer: Self-pay | Admitting: Adult Health

## 2015-03-24 ENCOUNTER — Ambulatory Visit (INDEPENDENT_AMBULATORY_CARE_PROVIDER_SITE_OTHER): Payer: Commercial Managed Care - HMO | Admitting: Adult Health

## 2015-03-24 VITALS — BP 108/68 | HR 52 | Temp 98.3°F | Ht 67.0 in | Wt 162.3 lb

## 2015-03-24 DIAGNOSIS — J069 Acute upper respiratory infection, unspecified: Secondary | ICD-10-CM

## 2015-03-24 NOTE — Patient Instructions (Addendum)
Your symptoms sound like a viral infection and/or allergies. Continue to take your Claritin and add Flonase ( one squirt in each nostril twice a day). Take tylenol or ibuprofen for your low grade fever. If you are not feeling better by the end of the week, please let me know. Upper Respiratory Infection, Adult An upper respiratory infection (URI) is also sometimes known as the common cold. The upper respiratory tract includes the nose, sinuses, throat, trachea, and bronchi. Bronchi are the airways leading to the lungs. Most people improve within 1 week, but symptoms can last up to 2 weeks. A residual cough may last even longer.  CAUSES Many different viruses can infect the tissues lining the upper respiratory tract. The tissues become irritated and inflamed and often become very moist. Mucus production is also common. A cold is contagious. You can easily spread the virus to others by oral contact. This includes kissing, sharing a glass, coughing, or sneezing. Touching your mouth or nose and then touching a surface, which is then touched by another person, can also spread the virus. SYMPTOMS  Symptoms typically develop 1 to 3 days after you come in contact with a cold virus. Symptoms vary from person to person. They may include:  Runny nose.  Sneezing.  Nasal congestion.  Sinus irritation.  Sore throat.  Loss of voice (laryngitis).  Cough.  Fatigue.  Muscle aches.  Loss of appetite.  Headache.  Low-grade fever. DIAGNOSIS  You might diagnose your own cold based on familiar symptoms, since most people get a cold 2 to 3 times a year. Your caregiver can confirm this based on your exam. Most importantly, your caregiver can check that your symptoms are not due to another disease such as strep throat, sinusitis, pneumonia, asthma, or epiglottitis. Blood tests, throat tests, and X-rays are not necessary to diagnose a common cold, but they may sometimes be helpful in excluding other more serious  diseases. Your caregiver will decide if any further tests are required. RISKS AND COMPLICATIONS  You may be at risk for a more severe case of the common cold if you smoke cigarettes, have chronic heart disease (such as heart failure) or lung disease (such as asthma), or if you have a weakened immune system. The very young and very old are also at risk for more serious infections. Bacterial sinusitis, middle ear infections, and bacterial pneumonia can complicate the common cold. The common cold can worsen asthma and chronic obstructive pulmonary disease (COPD). Sometimes, these complications can require emergency medical care and may be life-threatening. PREVENTION  The best way to protect against getting a cold is to practice good hygiene. Avoid oral or hand contact with people with cold symptoms. Wash your hands often if contact occurs. There is no clear evidence that vitamin C, vitamin E, echinacea, or exercise reduces the chance of developing a cold. However, it is always recommended to get plenty of rest and practice good nutrition. TREATMENT  Treatment is directed at relieving symptoms. There is no cure. Antibiotics are not effective, because the infection is caused by a virus, not by bacteria. Treatment may include:  Increased fluid intake. Sports drinks offer valuable electrolytes, sugars, and fluids.  Breathing heated mist or steam (vaporizer or shower).  Eating chicken soup or other clear broths, and maintaining good nutrition.  Getting plenty of rest.  Using gargles or lozenges for comfort.  Controlling fevers with ibuprofen or acetaminophen as directed by your caregiver.  Increasing usage of your inhaler if you have asthma.  Zinc gel and zinc lozenges, taken in the first 24 hours of the common cold, can shorten the duration and lessen the severity of symptoms. Pain medicines may help with fever, muscle aches, and throat pain. A variety of non-prescription medicines are available to  treat congestion and runny nose. Your caregiver can make recommendations and may suggest nasal or lung inhalers for other symptoms.  HOME CARE INSTRUCTIONS   Only take over-the-counter or prescription medicines for pain, discomfort, or fever as directed by your caregiver.  Use a warm mist humidifier or inhale steam from a shower to increase air moisture. This may keep secretions moist and make it easier to breathe.  Drink enough water and fluids to keep your urine clear or pale yellow.  Rest as needed.  Return to work when your temperature has returned to normal or as your caregiver advises. You may need to stay home longer to avoid infecting others. You can also use a face mask and careful hand washing to prevent spread of the virus. SEEK MEDICAL CARE IF:   After the first few days, you feel you are getting worse rather than better.  You need your caregiver's advice about medicines to control symptoms.  You develop chills, worsening shortness of breath, or brown or red sputum. These may be signs of pneumonia.  You develop yellow or brown nasal discharge or pain in the face, especially when you bend forward. These may be signs of sinusitis.  You develop a fever, swollen neck glands, pain with swallowing, or white areas in the back of your throat. These may be signs of strep throat. SEEK IMMEDIATE MEDICAL CARE IF:   You have a fever.  You develop severe or persistent headache, ear pain, sinus pain, or chest pain.  You develop wheezing, a prolonged cough, cough up blood, or have a change in your usual mucus (if you have chronic lung disease).  You develop sore muscles or a stiff neck. Document Released: 03/16/2001 Document Revised: 12/13/2011 Document Reviewed: 12/26/2013 [facility] Patient Information 2015 [place], [place]. This information is not intended to replace advice given to you by your health care provider. Make sure you discuss any questions you have with your health care  provider.

## 2015-03-24 NOTE — Progress Notes (Signed)
Pre visit review using our clinic review tool, if applicable. No additional management support is needed unless otherwise documented below in the visit note. 

## 2015-03-24 NOTE — Progress Notes (Signed)
Subjective:    Patient ID: Todd Watson, male    DOB: 09-May-1941, 74 y.o.   MRN: [id number]  HPI  Todd Watson presents to the office today for upper respiratory type symptoms that initially started a week ago. His symptoms include rhinorrhea, sore throat, cough, and sinus pressure. His symptoms were improving [place] where many other people had started experiencing the same issues. He feels better today. Has been using Claritin and Flonase  Review of Systems  Constitutional: Negative.   HENT: Positive for congestion, postnasal drip, rhinorrhea, sinus pressure and sore throat. Negative for ear discharge and ear pain.   Eyes: Negative.   Respiratory: Positive for cough. Negative for choking, chest tightness and wheezing.   Cardiovascular: Negative for chest pain, palpitations and leg swelling.  Gastrointestinal: Negative for nausea, diarrhea, constipation and blood in stool.  Musculoskeletal: Negative.   Allergic/Immunologic: Negative.   Neurological: Negative.   All other systems reviewed and are negative.  Past Medical History  Diagnosis Date  . Arthritis     History   Social History  . Marital Status: Married    Spouse Name: N/A  . Number of Children: N/A  . Years of Education: N/A   Occupational History  . Not on file.   Social History Main Topics  . Smoking status: Never Smoker   . Smokeless tobacco: Not on file  . Alcohol Use: No  . Drug Use: No  . Sexual Activity: Not on file   Other Topics Concern  . Not on file   Social History Narrative    Past Surgical History  Procedure Laterality Date  . Appendectomy  2000    No family history on file.  No Known Allergies  Current Outpatient Prescriptions on File Prior to Visit  Medication Sig Dispense Refill  . aspirin 81 MG tablet Take 81 mg by mouth daily.    . cyclobenzaprine (FLEXERIL) 5 MG tablet Take 1 tablet (5  mg total) by mouth 3 (three) times daily as needed for muscle spasms. 30 tablet 0  . glucosamine-chondroitin 500-400 MG tablet Take 1 tablet by mouth daily.    Marland Kitchen levothyroxine (SYNTHROID, LEVOTHROID) 112 MCG tablet Take 1 tablet (112 mcg total) by mouth daily before breakfast. 30 tablet 0  . lisinopril (PRINIVIL,ZESTRIL) 10 MG tablet Take 10 mg by mouth daily.    . Multiple Vitamin (MULTIVITAMIN) tablet Take 1 tablet by mouth daily.    . naproxen sodium (ANAPROX) 220 MG tablet Take 220 mg by mouth as needed.    . Omega-3 Fatty Acids (FISH OIL) 1200 MG CAPS Take by mouth.    Marland Kitchen omeprazole (PRILOSEC) 40 MG capsule Take 40 mg by mouth daily.    . Probiotic Product (PROBIOTIC DAILY PO) Take by mouth.    . vitamin C (ASCORBIC ACID) 500 MG tablet Take 500 mg by mouth daily.     No current facility-administered medications on file prior to visit.    BP 108/68 mmHg  Pulse 52  Temp(Src) 98.3 F (36.8 C) (Oral)  Ht 5\' 7"  (1.702 m)  Wt 162 lb 4.8 oz (73.619 kg)  BMI 25.41 kg/m2  SpO2 93%       Objective:   Physical Exam  Constitutional: He is oriented to person, place, and time. He appears well-developed and well-nourished. No distress.  HENT:  Head: Normocephalic and atraumatic.  Right Ear: External ear normal.  Left  Ear: External ear normal.  Nose: Nose normal.  Mouth/Throat: Oropharynx is clear and moist. No oropharyngeal exudate.  Eyes: Conjunctivae and EOM are normal. Pupils are equal, round, and reactive to light. Right eye exhibits no discharge. Left eye exhibits no discharge.  Neck: Normal range of motion. Neck supple. No tracheal deviation present.  Cardiovascular: Normal rate, normal heart sounds and intact distal pulses.  Exam reveals no gallop.   No murmur heard. Pulmonary/Chest: Effort normal and breath sounds normal. No respiratory distress. He has no wheezes. He has no rales. He exhibits no tenderness.  Abdominal: Soft. Bowel sounds are normal.  Musculoskeletal: Normal  range of motion. He exhibits no edema or tenderness.  Lymphadenopathy:    He has no cervical adenopathy.  Neurological: He is alert and oriented to person, place, and time.  Skin: Skin is warm and dry. No rash noted. No erythema.  Psychiatric: He has a normal mood and affect. His behavior is normal.       Assessment & Plan:  1. Acute upper respiratory infection - Continue with Claritin and Flonase for the next three days.  - Get plenty of rest and stay well hydrated.  - Follow up if no improvement in the next 2-3 days.

## 2015-05-28 ENCOUNTER — Ambulatory Visit (INDEPENDENT_AMBULATORY_CARE_PROVIDER_SITE_OTHER): Payer: Commercial Managed Care - HMO | Admitting: Adult Health

## 2015-05-28 ENCOUNTER — Encounter: Payer: Self-pay | Admitting: Adult Health

## 2015-05-28 VITALS — BP 110/72 | Temp 98.0°F | Ht 67.0 in | Wt 163.2 lb

## 2015-05-28 DIAGNOSIS — M6530 Trigger finger, unspecified finger: Secondary | ICD-10-CM

## 2015-05-28 MED ORDER — LIDOCAINE HCL 1 % IJ SOLN
0.5000 mL | Freq: Once | INTRAMUSCULAR | Status: AC
  Administered 2015-05-28: 0.5 mL

## 2015-05-28 MED ORDER — METHYLPREDNISOLONE ACETATE 40 MG/ML IJ SUSP
40.0000 mg | Freq: Once | INTRAMUSCULAR | Status: AC
  Administered 2015-05-28: 40 mg via INTRA_ARTICULAR

## 2015-05-28 NOTE — Progress Notes (Signed)
Subjective:    Patient ID: Todd Watson, male    DOB: [birth year], 74 y.o.   MRN: [id number]  HPI  74 year old male who presents to the office today for "locking and catching"  of his right trigger finger. This locking has been happening for one week, he endorses slight discomfort in his right trigger finger with movement. Has not tried anything OTC for this condition.   He has had a trigger point injection " 5 years ago", which had worked until one week ago.   Denies any other symptoms.   Review of Systems  Constitutional: Negative.   Musculoskeletal: Negative for joint swelling.       Discomfort " locking and catching" of right trigger finger  Skin: Negative.   Neurological: Negative for tremors and weakness.  All other systems reviewed and are negative.  Past Medical History  Diagnosis Date  . Arthritis     Social History   Social History  . Marital Status: Married    Spouse Name: N/A  . Number of Children: N/A  . Years of Education: N/A   Occupational History  . Not on file.   Social History Main Topics  . Smoking status: Never Smoker   . Smokeless tobacco: Not on file  . Alcohol Use: No  . Drug Use: No  . Sexual Activity: Not on file   Other Topics Concern  . Not on file   Social History Narrative    Past Surgical History  Procedure Laterality Date  . Appendectomy  2000    No family history on file.  No Known Allergies  Current Outpatient Prescriptions on File Prior to Visit  Medication Sig Dispense Refill  . aspirin 81 MG tablet Take 81 mg by mouth daily.    Marland Kitchen glucosamine-chondroitin 500-400 MG tablet Take 1 tablet by mouth daily.    Marland Kitchen levothyroxine (SYNTHROID, LEVOTHROID) 112 MCG tablet Take 1 tablet (112 mcg total) by mouth daily before breakfast. 30 tablet 0  . lisinopril (PRINIVIL,ZESTRIL) 10 MG tablet Take 10 mg by mouth daily.    . Multiple Vitamin (MULTIVITAMIN) tablet Take 1 tablet by mouth daily.    . naproxen sodium (ANAPROX) 220 MG  tablet Take 220 mg by mouth as needed.    . Omega-3 Fatty Acids (FISH OIL) 1200 MG CAPS Take by mouth.    Marland Kitchen omeprazole (PRILOSEC) 40 MG capsule Take 40 mg by mouth daily.    . Probiotic Product (PROBIOTIC DAILY PO) Take by mouth.    . vitamin C (ASCORBIC ACID) 500 MG tablet Take 500 mg by mouth daily.    . cyclobenzaprine (FLEXERIL) 5 MG tablet Take 1 tablet (5 mg total) by mouth 3 (three) times daily as needed for muscle spasms. (Patient not taking: Reported on 05/28/2015) 30 tablet 0   No current facility-administered medications on file prior to visit.    BP 110/72 mmHg  Temp(Src) 98 F (36.7 C) (Oral)  Ht 5\' 7"  (1.702 m)  Wt 163 lb 3.2 oz (74.027 kg)  BMI 25.55 kg/m2       Objective:   Physical Exam  Constitutional: He is oriented to person, place, and time. He appears well-developed and well-nourished. No distress.  Musculoskeletal: Normal range of motion. He exhibits tenderness. He exhibits no edema.  pain is localized over the volar aspect of the metacarpophalangeal  joint and radiates into the distal finger  Able to move all fingers without difficulty. A catching feeling felt in right trigger finger, occasional  locking of same finger.   Neurological: He is alert and oriented to person, place, and time.  Skin: Skin is warm and dry. No rash noted. He is not diaphoretic. No erythema. No pallor.  Psychiatric: He has a normal mood and affect. His behavior is normal. Judgment and thought content normal.  Nursing note and vitals reviewed.      Assessment & Plan:  1. Trigger finger of right hand Procedure:  Right trigger finger injection Risks, benefits, and alternatives explained and consent obtained. Time out conducted. Surface cleaned with betadine. Nodule localized and marked with skin marker. Sterile technique maintained throughout. 0.5 ml 1% lidocaine without epi and 0.57ml (40mg ) methylprednisolone injected into nodule on palm of right trigger finger at a 45% degree  angle.  Adequate anesthesia ensured. Pt stable. Aftercare and follow-up advised.  Patient endorses improvement before leaving office.  - Advised to follow up if no improvement  - Consider referral to orthopedics.

## 2015-05-28 NOTE — Patient Instructions (Addendum)
It was great seeing you again!  Please let me know if this injection does not work and we can refer you to orthopedics. You can take ibuprofen 400-600mg  (2-3 pills) every 8 hours as needed for discomfort.   Monitor for any signs of infection.    Trigger Finger Trigger finger (digital tendinitis and stenosing tenosynovitis) is a common disorder that causes an often painful catching of the fingers or thumb. It occurs as a clicking, snapping, or locking of a finger in the palm of the hand. This is caused by a problem with the tendons that flex or bend the fingers sliding smoothly through their sheaths. The condition may occur in any finger or a couple fingers at the same time.  The finger may lock with the finger curled or suddenly straighten out with a snap. This is more common in patients with rheumatoid arthritis and diabetes. Left untreated, the condition may get worse to the point where the finger becomes locked in flexion, like making a fist, or less commonly locked with the finger straightened out. CAUSES   Inflammation and scarring that lead to swelling around the tendon sheath.  Repeated or forceful movements.  Rheumatoid arthritis, an autoimmune disease that affects joints.  Gout.  Diabetes mellitus. SIGNS AND SYMPTOMS  Soreness and swelling of your finger.  A painful clicking or snapping as you bend and straighten your finger. DIAGNOSIS  Your health care provider will do a physical exam of your finger to diagnose trigger finger. TREATMENT   Splinting for 6-8 weeks may be helpful.  Nonsteroidal anti-inflammatory medicines (NSAIDs) can help to relieve the pain and inflammation.  Cortisone injections, along with splinting, may speed up recovery. Several injections may be required. Cortisone may give relief after one injection.  Surgery is another treatment that may be used if conservative treatments do not work. Surgery can be minor, without incisions (a cut does not have to  be made), and can be done with a needle through the skin.  Other surgical choices involve an open procedure in which the surgeon opens the hand through a small incision and cuts the pulley so the tendon can again slide smoothly. Your hand will still work fine. HOME CARE INSTRUCTIONS  Apply ice to the injured area, twice per day:  Put ice in a plastic bag.  Place a towel between your skin and the bag.  Leave the ice on for 20 minutes, 3-4 times a day.  Rest your hand often. MAKE SURE YOU:   Understand these instructions.  Will watch your condition.  Will get help right away if you are not doing well or get worse. Document Released: 07/10/2004 Document Revised: 05/23/2013 Document Reviewed: 02/20/2013 [facility] Patient Information 2015 [place], [place]. This information is not intended to replace advice given to you by your health care provider. Make sure you discuss any questions you have with your health care provider.

## 2015-05-28 NOTE — Progress Notes (Signed)
Pre visit review using our clinic review tool, if applicable. No additional management support is needed unless otherwise documented below in the visit note. 

## 2015-07-31 ENCOUNTER — Ambulatory Visit (INDEPENDENT_AMBULATORY_CARE_PROVIDER_SITE_OTHER): Payer: Commercial Managed Care - HMO | Admitting: Adult Health

## 2015-07-31 ENCOUNTER — Encounter: Payer: Self-pay | Admitting: Adult Health

## 2015-07-31 VITALS — BP 150/92 | HR 44 | Temp 97.4°F | Resp 16 | Ht 65.75 in | Wt 158.7 lb

## 2015-07-31 DIAGNOSIS — R001 Bradycardia, unspecified: Secondary | ICD-10-CM

## 2015-07-31 DIAGNOSIS — E119 Type 2 diabetes mellitus without complications: Secondary | ICD-10-CM

## 2015-07-31 DIAGNOSIS — Z23 Encounter for immunization: Secondary | ICD-10-CM

## 2015-07-31 DIAGNOSIS — Z125 Encounter for screening for malignant neoplasm of prostate: Secondary | ICD-10-CM | POA: Diagnosis not present

## 2015-07-31 DIAGNOSIS — I10 Essential (primary) hypertension: Secondary | ICD-10-CM | POA: Diagnosis not present

## 2015-07-31 DIAGNOSIS — Z0000 Encounter for general adult medical examination without abnormal findings: Secondary | ICD-10-CM

## 2015-07-31 LAB — POCT URINALYSIS DIPSTICK
Bilirubin, UA: NEGATIVE
Blood, UA: NEGATIVE
Glucose, UA: NEGATIVE
Ketones, UA: NEGATIVE
Leukocytes, UA: NEGATIVE
Nitrite, UA: NEGATIVE
Spec Grav, UA: 1.02
Urobilinogen, UA: 1
pH, UA: 7

## 2015-07-31 LAB — HEPATIC FUNCTION PANEL
ALT: 40 U/L (ref 0–53)
AST: 36 U/L (ref 0–37)
Albumin: 4.5 g/dL (ref 3.5–5.2)
Alkaline Phosphatase: 65 U/L (ref 39–117)
Bilirubin, Direct: 0.1 mg/dL (ref 0.0–0.3)
Total Bilirubin: 0.6 mg/dL (ref 0.2–1.2)
Total Protein: 7.2 g/dL (ref 6.0–8.3)

## 2015-07-31 LAB — CBC WITH DIFFERENTIAL/PLATELET
Basophils Absolute: 0 10*3/uL (ref 0.0–0.1)
Basophils Relative: 0.4 % (ref 0.0–3.0)
Eosinophils Absolute: 0.2 10*3/uL (ref 0.0–0.7)
Eosinophils Relative: 3.4 % (ref 0.0–5.0)
HCT: 43.9 % (ref 39.0–52.0)
Hemoglobin: 15.1 g/dL (ref 13.0–17.0)
Lymphocytes Relative: 24.9 % (ref 12.0–46.0)
Lymphs Abs: 1.5 10*3/uL (ref 0.7–4.0)
MCHC: 34.4 g/dL (ref 30.0–36.0)
MCV: 92.9 fl (ref 78.0–100.0)
Monocytes Absolute: 0.6 10*3/uL (ref 0.1–1.0)
Monocytes Relative: 9.3 % (ref 3.0–12.0)
Neutro Abs: 3.7 10*3/uL (ref 1.4–7.7)
Neutrophils Relative %: 62 % (ref 43.0–77.0)
Platelets: 204 10*3/uL (ref 150.0–400.0)
RBC: 4.73 Mil/uL (ref 4.22–5.81)
RDW: 13.8 % (ref 11.5–15.5)
WBC: 5.9 10*3/uL (ref 4.0–10.5)

## 2015-07-31 LAB — LIPID PANEL
Cholesterol: 212 mg/dL — ABNORMAL HIGH (ref 0–200)
HDL: 40.9 mg/dL (ref >39.00)
LDL Cholesterol: 142 mg/dL — ABNORMAL HIGH (ref 0–99)
NonHDL: 171.04
Total CHOL/HDL Ratio: 5
Triglycerides: 143 mg/dL (ref 0.0–149.0)
VLDL: 28.6 mg/dL (ref 0.0–40.0)

## 2015-07-31 LAB — TSH: TSH: 11.3 u[IU]/mL — ABNORMAL HIGH (ref 0.35–4.50)

## 2015-07-31 LAB — BASIC METABOLIC PANEL
BUN: 22 mg/dL (ref 6–23)
CO2: 32 mEq/L (ref 19–32)
Calcium: 9.6 mg/dL (ref 8.4–10.5)
Chloride: 99 mEq/L (ref 96–112)
Creatinine, Ser: 0.87 mg/dL (ref 0.40–1.50)
GFR: 90.51 mL/min (ref >60.00)
Glucose, Bld: 134 mg/dL — ABNORMAL HIGH (ref 70–99)
Potassium: 4.8 meq/L (ref 3.5–5.1)
Sodium: 138 mEq/L (ref 135–145)

## 2015-07-31 LAB — HEMOGLOBIN A1C: Hgb A1c MFr Bld: 6.2 % (ref 4.6–6.5)

## 2015-07-31 LAB — PSA: PSA: 1.01 ng/mL (ref 0.10–4.00)

## 2015-07-31 NOTE — Progress Notes (Signed)
Subjective:    Patient ID: Todd Watson, male    DOB: 1940-12-22, 74 y.o.   MRN: [id number]  HPI  Patient presents for yearly preventative medicine examination.  All immunizations and health maintenance protocols were reviewed with the patient and needed orders were placed.  Appropriate screening laboratory values were ordered for the patient including screening of hyperlipidemia, renal function and hepatic function. If indicated by BPH, a PSA was ordered.  Medication reconciliation,  past medical history, social history, problem list and allergies were reviewed in detail with the patient  Goals were established with regard to weight loss, exercise, and  diet in compliance with medications  End of life planning was discussed he has a living will and healthcare power of attorney.   He has no acute concerns at this time  Review of Systems  Constitutional: Negative.   HENT: Negative.   Eyes: Negative.   Respiratory: Negative.   Cardiovascular: Negative.   Gastrointestinal: Negative.   Endocrine: Negative.   Genitourinary: Negative.   Musculoskeletal: Negative.   Skin: Negative.   Allergic/Immunologic: Negative.   Neurological: Negative.   Hematological: Negative.   Psychiatric/Behavioral: Negative.   All other systems reviewed and are negative.  Past Medical History  Diagnosis Date  . Arthritis   . Diverticulitis     Social History   Social History  . Marital Status: Married    Spouse Name: N/A  . Number of Children: N/A  . Years of Education: N/A   Occupational History  . Not on file.   Social History Main Topics  . Smoking status: Never Smoker   . Smokeless tobacco: Not on file  . Alcohol Use: No  . Drug Use: No  . Sexual Activity: Not on file   Other Topics Concern  . Not on file   Social History Narrative    Past Surgical History  Procedure Laterality Date  . Appendectomy  2000    No family history on file.  No Known Allergies  Current  Outpatient Prescriptions on File Prior to Visit  Medication Sig Dispense Refill  . aspirin 81 MG tablet Take 81 mg by mouth daily.    . cyclobenzaprine (FLEXERIL) 5 MG tablet Take 1 tablet (5 mg total) by mouth 3 (three) times daily as needed for muscle spasms. 30 tablet 0  . glucosamine-chondroitin 500-400 MG tablet Take 1 tablet by mouth daily.    Marland Kitchen levothyroxine (SYNTHROID, LEVOTHROID) 112 MCG tablet Take 1 tablet (112 mcg total) by mouth daily before breakfast. 30 tablet 0  . lisinopril (PRINIVIL,ZESTRIL) 10 MG tablet Take 10 mg by mouth daily.    . Multiple Vitamin (MULTIVITAMIN) tablet Take 1 tablet by mouth daily.    . naproxen sodium (ANAPROX) 220 MG tablet Take 220 mg by mouth as needed.    . Omega-3 Fatty Acids (FISH OIL) 1200 MG CAPS Take by mouth.    Marland Kitchen omeprazole (PRILOSEC) 40 MG capsule Take 40 mg by mouth daily.    . Probiotic Product (PROBIOTIC DAILY PO) Take by mouth.    . vitamin C (ASCORBIC ACID) 500 MG tablet Take 500 mg by mouth daily.     No current facility-administered medications on file prior to visit.    BP 150/92 mmHg  Pulse 44  Temp(Src) 97.4 F (36.3 C) (Oral)  Resp 16  Ht 5' 5.75" (1.67 m)  Wt 158 lb 11.2 oz (71.986 kg)  BMI 25.81 kg/m2  SpO2 98%       Objective:  Physical Exam  Constitutional: He is oriented to person, place, and time. He appears well-developed and well-nourished. No distress.  HENT:  Head: Normocephalic and atraumatic.  Right Ear: External ear normal.  Left Ear: External ear normal.  Nose: Nose normal.  Mouth/Throat: Oropharynx is clear and moist. No oropharyngeal exudate.  Mild cerumen in bilateral ear canals  Eyes: Conjunctivae and EOM are normal. Pupils are equal, round, and reactive to light. Right eye exhibits no discharge. Left eye exhibits no discharge. No scleral icterus.  Neck: Normal range of motion. Neck supple. No JVD present. No tracheal deviation present. No thyromegaly present.  Cardiovascular: Regular rhythm,  normal heart sounds and intact distal pulses.  Exam reveals no gallop and no friction rub.   No murmur heard. Bradycardic   Pulmonary/Chest: Effort normal and breath sounds normal. No respiratory distress. He has no wheezes. He has no rales. He exhibits no tenderness.  Abdominal: Soft. Bowel sounds are normal. He exhibits no distension and no mass. There is no tenderness. There is no rebound and no guarding.  Genitourinary: Rectum normal, prostate normal and penis normal. Guaiac negative stool. No penile tenderness.  Musculoskeletal: Normal range of motion. He exhibits no edema or tenderness.  Lymphadenopathy:    He has no cervical adenopathy.  Neurological: He is alert and oriented to person, place, and time. He has normal reflexes.  Skin: Skin is warm and dry. No rash noted. He is not diaphoretic. No erythema. No pallor.  Scattered moles and keratosis throughout body.    Psychiatric: He has a normal mood and affect. His behavior is normal. Judgment and thought content normal.  Nursing note and vitals reviewed.      Assessment & Plan:  1. Essential hypertension, benign - Did not take blood pressure medication this morning - Basic metabolic panel - CBC with Differential/Platelet - EKG 12-Lead - Bradycardic, Rate 41 - Hepatic function panel - Lipid panel - POCT urinalysis dipstick  2. Type 2 diabetes mellitus without complication, without long-term current use of insulin (HCC) - has been diet controlled - Basic metabolic panel - EKG 93-ATFT - Hemoglobin A1c  3. Routine general medical examination at a health care facility  - Basic metabolic panel - CBC with Differential/Platelet - EKG 12-Lead - Hemoglobin A1c - Hepatic function panel - Lipid panel - POCT urinalysis dipstick - PSA - TSH  4. Bradycardia - Asymptomatic - Endorses having low heart rate in the past - Denies ever seeing a cardiologist.  - Is not on a beta blocker.  - EKG 12-Lead - Referral to cardiology  placed

## 2015-07-31 NOTE — Patient Instructions (Addendum)
It was great seeing you again!  I will follow up with you regarding your blood work.   Someone from the cardiology office will call you to schedule an appointment  If you need anything, please let me know.

## 2015-07-31 NOTE — Progress Notes (Signed)
Pre visit review using our clinic review tool, if applicable. No additional management support is needed unless otherwise documented below in the visit note. 

## 2015-08-01 ENCOUNTER — Other Ambulatory Visit: Payer: Self-pay | Admitting: Adult Health

## 2015-08-01 ENCOUNTER — Telehealth: Payer: Self-pay

## 2015-08-01 DIAGNOSIS — E039 Hypothyroidism, unspecified: Secondary | ICD-10-CM

## 2015-08-01 MED ORDER — LEVOTHYROXINE SODIUM 125 MCG PO TABS: 125.0000 ug | ORAL_TABLET | Freq: Every day | ORAL | Status: DC

## 2015-08-01 NOTE — Telephone Encounter (Signed)
Per Cory's request called to advise pt that his lab results showed: TSH- changed synthroid dose to 125 mcg and we will re-check before leaving for [place].  Advised to call back to schedule a lab appt.  Total cholesterol and LDL (bad) are elevated. Work on diet. Pt's spouse ([place]) is aware.

## 2015-08-12 ENCOUNTER — Encounter: Payer: Self-pay | Admitting: Cardiology

## 2015-08-18 ENCOUNTER — Ambulatory Visit: Payer: Commercial Managed Care - HMO | Admitting: Cardiology

## 2015-09-04 ENCOUNTER — Encounter: Payer: Self-pay | Admitting: Cardiology

## 2015-09-04 ENCOUNTER — Ambulatory Visit (INDEPENDENT_AMBULATORY_CARE_PROVIDER_SITE_OTHER): Payer: Commercial Managed Care - HMO | Admitting: Cardiology

## 2015-09-04 VITALS — BP 124/76 | HR 40 | Ht 67.0 in | Wt 156.6 lb

## 2015-09-04 DIAGNOSIS — R001 Bradycardia, unspecified: Secondary | ICD-10-CM

## 2015-09-04 NOTE — Progress Notes (Signed)
Cardiology Office Note   Date:  09/04/2015   ID:  Todd Watson, Todd Watson 25-Jan-1941, MRN [id number]  PCP:  [name], NP  Cardiologist:   Minus Breeding, MD   Chief Complaint  Patient presents with  . Bradycardia      History of Present Illness: Todd Watson is a 74 y.o. male who presents for evaluation of bradycardia. He has no prior cardiac history. He does have a slow heart rhythm that he's had for years. However, he remains very active. He made his own wood splitter and he splits wood. He does his house before. He does a lot of work in his yard. With this he has no symptoms. The patient denies any new symptoms such as chest discomfort, neck or arm discomfort. There has been no new shortness of breath, PND or orthopnea. There have been no reported palpitations, presyncope or syncope.   Past Medical History  Diagnosis Date  . Arthritis   . Diverticulitis   . Diabetes type 2, controlled ([place])   . History of kidney stones   . Osteoarthritis of lumbar spine   . Hiatal hernia   . GERD (gastroesophageal reflux disease)   . Hypothyroidism   . Herpes zoster     Past Surgical History  Procedure Laterality Date  . Appendectomy  2000  . Inguinal hernia repair Bilateral   . Spine surgery      spurs     Current Outpatient Prescriptions  Medication Sig Dispense Refill  . aspirin 81 MG tablet Take 81 mg by mouth daily.    Marland Kitchen atorvastatin (LIPITOR) 20 MG tablet Take 20 mg by mouth daily.    . cyclobenzaprine (FLEXERIL) 5 MG tablet Take 1 tablet (5 mg total) by mouth 3 (three) times daily as needed for muscle spasms. 30 tablet 0  . glucosamine-chondroitin 500-400 MG tablet Take 1 tablet by mouth daily.    Marland Kitchen levothyroxine (SYNTHROID, LEVOTHROID) 125 MCG tablet Take 1 tablet (125 mcg total) by mouth daily before breakfast. 30 tablet 3  . lisinopril (PRINIVIL,ZESTRIL) 10 MG tablet Take 10 mg by mouth daily.    . Multiple Vitamin (MULTIVITAMIN) tablet Take 1 tablet by mouth daily.     . naproxen sodium (ANAPROX) 220 MG tablet Take 220 mg by mouth as needed.    . Omega-3 Fatty Acids (FISH OIL) 1200 MG CAPS Take by mouth.    Marland Kitchen omeprazole (PRILOSEC) 40 MG capsule Take 40 mg by mouth daily.    . Probiotic Product (PROBIOTIC DAILY PO) Take by mouth.    . vitamin C (ASCORBIC ACID) 500 MG tablet Take 500 mg by mouth daily.     No current facility-administered medications for this visit.    Allergies:   Review of patient's allergies indicates no known allergies.    Social History:  The patient  reports that he has never smoked. He does not have any smokeless tobacco history on file. He reports that he does not drink alcohol or use illicit drugs.   Family History:  The patient's family history includes Heart disease in his father; Kidney failure in his mother.    ROS:  Please see the history of present illness.   Otherwise, review of systems are positive for none.   All other systems are reviewed and negative.    PHYSICAL EXAM: VS:  BP 124/76 mmHg  Pulse 40  Ht 5\' 7"  (1.702 m)  Wt 156 lb 9.6 oz (71.033 kg)  BMI 24.52 kg/m2 , BMI  Body mass index is 24.52 kg/(m^2). GENERAL:  Well appearing HEENT:  Pupils equal round and reactive, fundi not visualized, oral mucosa unremarkable NECK:  No jugular venous distention, waveform within normal limits, carotid upstroke brisk and symmetric, no bruits, no thyromegaly LYMPHATICS:  No cervical, inguinal adenopathy LUNGS:  Clear to auscultation bilaterally BACK:  No CVA tenderness CHEST:  Unremarkable HEART:  PMI not displaced or sustained,S1 and S2 within normal limits, no S3, no S4, no clicks, no rubs, no murmurs ABD:  Flat, positive bowel sounds normal in frequency in pitch, no bruits, no rebound, no guarding, no midline pulsatile mass, no hepatomegaly, no splenomegaly EXT:  2 plus pulses throughout, no edema, no cyanosis no clubbing SKIN:  No rashes no nodules NEURO:  Cranial nerves II through XII grossly intact, motor grossly  intact throughout PSYCH:  Cognitively intact, oriented to person place and time    EKG:  EKG is ordered today. The ekg ordered today demonstrates sinus bradycardia, rate 40, axis within normal limits, intervals within normal limits, no ST-T wave changes.   Recent Labs: 07/31/2015: ALT 40; BUN 22; Creatinine, Ser 0.87; Hemoglobin 15.1; Platelets 204.0; Potassium 4.8; Sodium 138; TSH 11.30*    Lipid Panel    Component Value Date/Time   CHOL 212* 07/31/2015 1028   TRIG 143.0 07/31/2015 1028   HDL 40.90 07/31/2015 1028   CHOLHDL 5 07/31/2015 1028   VLDL 28.6 07/31/2015 1028   LDLCALC 142* 07/31/2015 1028      Wt Readings from Last 3 Encounters:  09/04/15 156 lb 9.6 oz (71.033 kg)  07/31/15 158 lb 11.2 oz (71.986 kg)  05/28/15 163 lb 3.2 oz (74.027 kg)      Other studies Reviewed: Additional studies/ records that were reviewed today include: None. Review of the above records demonstrates:  Please see elsewhere in the note.     ASSESSMENT AND PLAN:  BRADYCARDIA:  The patient has no symptoms. He is very physically active. Therefore, based on this there is no indication for pacing. No further evaluation is indicated.  DM:   His last A1c was 6.2. No change in therapy is indicated.   Lab Results  Component Value Date   HGBA1C 6.2 07/31/2015      Current medicines are reviewed at length with the patient today.  The patient does not have concerns regarding medicines.  The following changes have been made:  no change  Labs/ tests ordered today include: None     Disposition:   FU with me as needed.      Signed, Minus Breeding, MD  09/04/2015 9:14 AM    [facility]

## 2015-09-04 NOTE — Patient Instructions (Addendum)
Your physician recommends that you schedule a follow-up appointment in: As Needed  Merry Christmas and [place]!!

## 2016-02-05 ENCOUNTER — Ambulatory Visit (INDEPENDENT_AMBULATORY_CARE_PROVIDER_SITE_OTHER)
Admission: RE | Admit: 2016-02-05 | Discharge: 2016-02-05 | Disposition: A | Discharge status: Home / Self Care | Payer: Commercial Managed Care - HMO | Source: Ambulatory Visit | Attending: Adult Health | Admitting: Adult Health

## 2016-02-05 ENCOUNTER — Ambulatory Visit (INDEPENDENT_AMBULATORY_CARE_PROVIDER_SITE_OTHER): Payer: Commercial Managed Care - HMO | Admitting: Adult Health

## 2016-02-05 VITALS — BP 120/60 | Temp 98.1°F | Wt 164.8 lb

## 2016-02-05 DIAGNOSIS — M545 Low back pain, unspecified: Secondary | ICD-10-CM

## 2016-02-05 NOTE — Progress Notes (Signed)
Subjective:    Patient ID: Todd Watson, male    DOB: June 14, 1941, 75 y.o.   MRN: [id number]  HPI  75 year old male who presents for an acute issue of lower back pain. He reports that about 2 weeks ago he was driving in his car when he felt a " hot poke" on the right side of his lower back. The pain only lasted " a second". He has experienced that pain two times since, the last being yesterday. Each time the pain only is for a moment.    Review of Systems  Constitutional: Negative.   Respiratory: Negative.   Cardiovascular: Negative.   Musculoskeletal: Positive for back pain.  Skin: Negative.   Psychiatric/Behavioral: Negative.   All other systems reviewed and are negative.  Past Medical History  Diagnosis Date  . Arthritis   . Diverticulitis   . Diabetes type 2, controlled ([place])   . History of kidney stones   . Osteoarthritis of lumbar spine   . Hiatal hernia   . GERD (gastroesophageal reflux disease)   . Hypothyroidism   . Herpes zoster     Social History   Social History  . Marital Status: Married    Spouse Name: N/A  . Number of Children: 3  . Years of Education: N/A   Occupational History  . Not on file.   Social History Main Topics  . Smoking status: Never Smoker   . Smokeless tobacco: Not on file  . Alcohol Use: No  . Drug Use: No  . Sexual Activity: Not on file   Other Topics Concern  . Not on file   Social History Narrative   Lives with wife.  6 grandchildren.      Past Surgical History  Procedure Laterality Date  . Appendectomy  2000  . Inguinal hernia repair Bilateral   . Spine surgery      spurs    Family History  Problem Relation Age of Onset  . Heart disease Father     Valve disease  . Kidney failure Mother     No Known Allergies  Current Outpatient Prescriptions on File Prior to Visit  Medication Sig Dispense Refill  . aspirin 81 MG tablet Take 81 mg by mouth daily.    Marland Kitchen atorvastatin (LIPITOR) 20 MG tablet Take 20 mg by  mouth daily.    . cyclobenzaprine (FLEXERIL) 5 MG tablet Take 1 tablet (5 mg total) by mouth 3 (three) times daily as needed for muscle spasms. 30 tablet 0  . glucosamine-chondroitin 500-400 MG tablet Take 1 tablet by mouth daily.    Marland Kitchen levothyroxine (SYNTHROID, LEVOTHROID) 125 MCG tablet Take 1 tablet (125 mcg total) by mouth daily before breakfast. 30 tablet 3  . lisinopril (PRINIVIL,ZESTRIL) 10 MG tablet Take 10 mg by mouth daily.    . Multiple Vitamin (MULTIVITAMIN) tablet Take 1 tablet by mouth daily.    . naproxen sodium (ANAPROX) 220 MG tablet Take 220 mg by mouth as needed.    . Omega-3 Fatty Acids (FISH OIL) 1200 MG CAPS Take by mouth.    Marland Kitchen omeprazole (PRILOSEC) 40 MG capsule Take 40 mg by mouth daily.    . Probiotic Product (PROBIOTIC DAILY PO) Take by mouth.    . vitamin C (ASCORBIC ACID) 500 MG tablet Take 500 mg by mouth daily.     No current facility-administered medications on file prior to visit.    BP 120/60 mmHg  Temp(Src) 98.1 F (36.7 C) (Oral)  Wt  164 lb 12.8 oz (74.753 kg)       Objective:   Physical Exam  Constitutional: He is oriented to person, place, and time. He appears well-developed and well-nourished. No distress.  Pulmonary/Chest: Effort normal and breath sounds normal. No respiratory distress. He has no wheezes. He has no rales. He exhibits no tenderness.  Musculoskeletal: Normal range of motion. He exhibits no edema or tenderness.  Neurological: He is alert and oriented to person, place, and time.  Skin: Skin is warm and dry. No rash noted. He is not diaphoretic. No erythema. No pallor.  Psychiatric: He has a normal mood and affect. His behavior is normal. Judgment and thought content normal.  Nursing note and vitals reviewed.     Assessment & Plan:  1. Right-sided low back pain without sciatica - Sounds like more of a nerve pain. Will get an x ray.  - DG Lumbar Spine Complete; Future - Consider muscle relaxer or steroids.  - May consider  gabapentin if pain persist  [name], NP .

## 2016-02-05 NOTE — Patient Instructions (Signed)
It was great seeing you today.   I will follow up with you regarding the x ray and then we can decide on what to do next

## 2016-02-06 ENCOUNTER — Telehealth: Payer: Self-pay | Admitting: Adult Health

## 2016-02-06 NOTE — Telephone Encounter (Signed)
Spoke to patient's wife [place]. Informed her of the x ray findings. He is to follow up if he continues to have that pain

## 2016-02-06 NOTE — Telephone Encounter (Signed)
See below and advise

## 2016-02-06 NOTE — Telephone Encounter (Signed)
Pt states he just had a pain in his lower back on right hand side. li Even though pt states xray report was good, he is still concerned because it is a burning sensation. Pt thinks it could be kidney stones. Would like to know what Tommi Rumps thinks.  Pain has been more frequent the past 4-5 days. Please call back.

## 2016-05-21 ENCOUNTER — Telehealth: Payer: Self-pay | Admitting: Adult Health

## 2016-05-21 NOTE — Telephone Encounter (Signed)
Will patient need to be evaluated first?

## 2016-05-21 NOTE — Telephone Encounter (Signed)
Pt has spots on his face and has some concern. Pt would like a referral to a dermatologist.

## 2016-05-21 NOTE — Telephone Encounter (Signed)
He does not need a referral to dermatology   He can go to either  [facility]  [facility]  [phone]

## 2016-05-21 NOTE — Telephone Encounter (Signed)
Patient notified

## 2016-08-23 ENCOUNTER — Telehealth: Payer: Self-pay | Admitting: Adult Health

## 2016-08-23 NOTE — Telephone Encounter (Signed)
The patient and his wife called and left a message needing a referral to Dr. Lara Mulch (urologist)

## 2016-08-24 NOTE — Telephone Encounter (Signed)
Left message for patient to return phone call.  

## 2016-08-24 NOTE — Telephone Encounter (Signed)
He signed out if they both need to be referred to urology and the reason for referral

## 2016-09-01 NOTE — Telephone Encounter (Signed)
Left message for patient to return phone call.  

## 2017-02-26 DIAGNOSIS — M25562 Pain in left knee: Secondary | ICD-10-CM | POA: Diagnosis not present

## 2017-03-04 DIAGNOSIS — M545 Low back pain: Secondary | ICD-10-CM | POA: Diagnosis not present

## 2017-03-05 DIAGNOSIS — M5416 Radiculopathy, lumbar region: Secondary | ICD-10-CM | POA: Diagnosis not present

## 2017-03-05 DIAGNOSIS — I10 Essential (primary) hypertension: Secondary | ICD-10-CM | POA: Diagnosis not present

## 2017-03-05 DIAGNOSIS — E119 Type 2 diabetes mellitus without complications: Secondary | ICD-10-CM | POA: Diagnosis not present

## 2017-03-05 DIAGNOSIS — E079 Disorder of thyroid, unspecified: Secondary | ICD-10-CM | POA: Diagnosis not present

## 2017-03-05 DIAGNOSIS — M1388 Other specified arthritis, other site: Secondary | ICD-10-CM | POA: Diagnosis not present

## 2017-03-05 DIAGNOSIS — Z79899 Other long term (current) drug therapy: Secondary | ICD-10-CM | POA: Diagnosis not present

## 2017-03-05 DIAGNOSIS — M545 Low back pain: Secondary | ICD-10-CM | POA: Diagnosis not present

## 2017-03-05 DIAGNOSIS — M79605 Pain in left leg: Secondary | ICD-10-CM | POA: Diagnosis not present

## 2017-03-05 DIAGNOSIS — R509 Fever, unspecified: Secondary | ICD-10-CM | POA: Diagnosis not present

## 2017-03-08 DIAGNOSIS — M5440 Lumbago with sciatica, unspecified side: Secondary | ICD-10-CM | POA: Diagnosis not present

## 2017-03-08 DIAGNOSIS — M5416 Radiculopathy, lumbar region: Secondary | ICD-10-CM | POA: Diagnosis not present

## 2017-04-12 ENCOUNTER — Ambulatory Visit (INDEPENDENT_AMBULATORY_CARE_PROVIDER_SITE_OTHER): Payer: PPO | Admitting: Adult Health

## 2017-04-12 ENCOUNTER — Other Ambulatory Visit: Payer: Self-pay | Admitting: Adult Health

## 2017-04-12 ENCOUNTER — Encounter: Payer: Self-pay | Admitting: Adult Health

## 2017-04-12 VITALS — BP 98/68 | HR 58 | Temp 98.1°F | Ht 67.0 in | Wt 152.8 lb

## 2017-04-12 DIAGNOSIS — Z0000 Encounter for general adult medical examination without abnormal findings: Secondary | ICD-10-CM

## 2017-04-12 DIAGNOSIS — Z1331 Encounter for screening for depression: Secondary | ICD-10-CM

## 2017-04-12 DIAGNOSIS — E782 Mixed hyperlipidemia: Secondary | ICD-10-CM

## 2017-04-12 DIAGNOSIS — I10 Essential (primary) hypertension: Secondary | ICD-10-CM | POA: Diagnosis not present

## 2017-04-12 DIAGNOSIS — E119 Type 2 diabetes mellitus without complications: Secondary | ICD-10-CM

## 2017-04-12 DIAGNOSIS — Z125 Encounter for screening for malignant neoplasm of prostate: Secondary | ICD-10-CM | POA: Diagnosis not present

## 2017-04-12 DIAGNOSIS — Z1389 Encounter for screening for other disorder: Secondary | ICD-10-CM | POA: Diagnosis not present

## 2017-04-12 DIAGNOSIS — E039 Hypothyroidism, unspecified: Secondary | ICD-10-CM

## 2017-04-12 LAB — CBC WITH DIFFERENTIAL/PLATELET
Basophils Absolute: 0 10*3/uL (ref 0.0–0.1)
Basophils Relative: 0.3 % (ref 0.0–3.0)
Eosinophils Absolute: 0.1 10*3/uL (ref 0.0–0.7)
Eosinophils Relative: 2.1 % (ref 0.0–5.0)
HCT: 40.9 % (ref 39.0–52.0)
Hemoglobin: 14.3 g/dL (ref 13.0–17.0)
Lymphocytes Relative: 23.4 % (ref 12.0–46.0)
Lymphs Abs: 1.3 10*3/uL (ref 0.7–4.0)
MCHC: 34.9 g/dL (ref 30.0–36.0)
MCV: 92.4 fl (ref 78.0–100.0)
Monocytes Absolute: 0.5 10*3/uL (ref 0.1–1.0)
Monocytes Relative: 9.2 % (ref 3.0–12.0)
Neutro Abs: 3.5 10*3/uL (ref 1.4–7.7)
Neutrophils Relative %: 65 % (ref 43.0–77.0)
Platelets: 182 10*3/uL (ref 150.0–400.0)
RBC: 4.42 Mil/uL (ref 4.22–5.81)
RDW: 14 % (ref 11.5–15.5)
WBC: 5.4 10*3/uL (ref 4.0–10.5)

## 2017-04-12 LAB — BASIC METABOLIC PANEL
BUN: 18 mg/dL (ref 6–23)
CO2: 30 meq/L (ref 19–32)
Calcium: 9.5 mg/dL (ref 8.4–10.5)
Chloride: 102 meq/L (ref 96–112)
Creatinine, Ser: 0.76 mg/dL (ref 0.40–1.50)
GFR: 105.31 mL/min (ref >60.00)
Glucose, Bld: 104 mg/dL — ABNORMAL HIGH (ref 70–99)
Potassium: 4.3 meq/L (ref 3.5–5.1)
Sodium: 140 mEq/L (ref 135–145)

## 2017-04-12 LAB — HEPATIC FUNCTION PANEL
ALT: 23 U/L (ref 0–53)
AST: 20 U/L (ref 0–37)
Albumin: 1.8 g/dL — ABNORMAL LOW (ref 3.5–5.2)
Alkaline Phosphatase: 57 U/L (ref 39–117)
Bilirubin, Direct: 0.1 mg/dL (ref 0.0–0.3)
Total Bilirubin: 0.8 mg/dL (ref 0.2–1.2)
Total Protein: 6.8 g/dL (ref 6.0–8.3)

## 2017-04-12 LAB — HEMOGLOBIN A1C: Hgb A1c MFr Bld: 6.8 % — ABNORMAL HIGH (ref 4.6–6.5)

## 2017-04-12 LAB — LIPID PANEL
Cholesterol: 154 mg/dL (ref 0–200)
HDL: 44 mg/dL (ref >39.00)
LDL Cholesterol: 86 mg/dL (ref 0–99)
NonHDL: 109.71
Total CHOL/HDL Ratio: 3
Triglycerides: 120 mg/dL (ref 0.0–149.0)
VLDL: 24 mg/dL (ref 0.0–40.0)

## 2017-04-12 LAB — MICROALBUMIN / CREATININE URINE RATIO
Creatinine,U: 209.1 mg/dL
Microalb Creat Ratio: 1.1 mg/g (ref 0.0–30.0)
Microalb, Ur: 2.2 mg/dL — ABNORMAL HIGH (ref 0.0–1.9)

## 2017-04-12 LAB — PSA: PSA: 1.21 ng/mL (ref 0.10–4.00)

## 2017-04-12 LAB — TSH: TSH: 1 u[IU]/mL (ref 0.35–4.50)

## 2017-04-12 MED ORDER — LEVOTHYROXINE SODIUM 125 MCG PO TABS: 125.0000 ug | ORAL_TABLET | Freq: Every day | ORAL | 3 refills | Status: DC

## 2017-04-12 NOTE — Progress Notes (Signed)
Subjective:    Patient ID: Todd Watson, male    DOB: Apr 22, 1941, 76 y.o.   MRN: [id number]  HPI  Patient presents for yearly preventative medicine examination. He is a pleasant 76 year old male who  has a past medical history of Arthritis; Diabetes type 2, controlled ([place]); Diverticulitis; GERD (gastroesophageal reflux disease); Herpes zoster; Hiatal hernia; History of kidney stones; Hypothyroidism; and Osteoarthritis of lumbar spine.  All immunizations and health maintenance protocols were reviewed with the patient and needed orders were placed. He is unsure of he has had his pneumonia vaccinations  Appropriate screening laboratory values were ordered for the patient including screening of hyperlipidemia, renal function and hepatic function.  Medication reconciliation,  past medical history, social history, problem list and allergies were reviewed in detail with the patient  Goals were established with regard to weight loss, exercise, and  diet in compliance with medications. He stays active cutting wood every day  End of life planning was discussed. He has a living will and health care power of attorney   He takes lipitor 20 mg for history of hyperlipidemia   He takes lisinopril 10mg   For hypertension and synthroid 125 mcg for hypothyroidism. Today in the office his blood pressure is 98/68  Diabetes has been diet controlled in the past. Last A1c was 6.2 on 07/31/2015   He does a lot of his care at the [facility] in [facility] Readings from Last 3 Encounters:  04/12/17 152 lb 12.8 oz (69.3 kg)  02/05/16 164 lb 12.8 oz (74.8 kg)  09/04/15 156 lb 9.6 oz (71 kg)   BP Readings from Last 3 Encounters:  04/12/17 98/68  02/05/16 120/60  09/04/15 124/76    Review of Systems  Constitutional: Negative.   HENT: Positive for hearing loss.   Eyes: Negative.   Respiratory: Negative.   Cardiovascular: Negative.   Gastrointestinal: Negative.   Endocrine: Negative.   Genitourinary:  Negative.   Musculoskeletal: Negative.   Skin: Negative.   Allergic/Immunologic: Negative.   Neurological: Negative.   Hematological: Negative.   Psychiatric/Behavioral: Negative.   All other systems reviewed and are negative.  Past Medical History:  Diagnosis Date  . Arthritis   . Diabetes type 2, controlled ([place])   . Diverticulitis   . GERD (gastroesophageal reflux disease)   . Herpes zoster   . Hiatal hernia   . History of kidney stones   . Hypothyroidism   . Osteoarthritis of lumbar spine     Social History   Social History  . Marital status: Married    Spouse name: N/A  . Number of children: 3  . Years of education: N/A   Occupational History  . Not on file.   Social History Main Topics  . Smoking status: Never Smoker  . Smokeless tobacco: Not on file  . Alcohol use No  . Drug use: No  . Sexual activity: Not on file   Other Topics Concern  . Not on file   Social History Narrative   Lives with wife.  6 grandchildren.      Past Surgical History:  Procedure Laterality Date  . APPENDECTOMY  2000  . INGUINAL HERNIA REPAIR Bilateral   . SPINE SURGERY     spurs    Family History  Problem Relation Age of Onset  . Heart disease Father        Valve disease  . Kidney failure Mother     No Known Allergies  Current Outpatient Prescriptions  on File Prior to Visit  Medication Sig Dispense Refill  . aspirin 81 MG tablet Take 81 mg by mouth daily.    Marland Kitchen atorvastatin (LIPITOR) 20 MG tablet Take 20 mg by mouth daily.    . cyclobenzaprine (FLEXERIL) 5 MG tablet Take 1 tablet (5 mg total) by mouth 3 (three) times daily as needed for muscle spasms. 30 tablet 0  . glucosamine-chondroitin 500-400 MG tablet Take 1 tablet by mouth daily.    Marland Kitchen levothyroxine (SYNTHROID, LEVOTHROID) 125 MCG tablet Take 1 tablet (125 mcg total) by mouth daily before breakfast. 30 tablet 3  . lisinopril (PRINIVIL,ZESTRIL) 10 MG tablet Take 10 mg by mouth daily.    . Multiple Vitamin  (MULTIVITAMIN) tablet Take 1 tablet by mouth daily.    . naproxen sodium (ANAPROX) 220 MG tablet Take 220 mg by mouth as needed.    . Omega-3 Fatty Acids (FISH OIL) 1200 MG CAPS Take by mouth.    Marland Kitchen omeprazole (PRILOSEC) 40 MG capsule Take 40 mg by mouth daily.    . Probiotic Product (PROBIOTIC DAILY PO) Take by mouth.    . vitamin C (ASCORBIC ACID) 500 MG tablet Take 500 mg by mouth daily.     No current facility-administered medications on file prior to visit.     There were no vitals taken for this visit.       Objective:   Physical Exam  Constitutional: He is oriented to person, place, and time. He appears well-developed and well-nourished. No distress.  HENT:  Head: Normocephalic and atraumatic.  Right Ear: External ear normal.  Left Ear: External ear normal.  Nose: Nose normal.  Mouth/Throat: Oropharynx is clear and moist. No oropharyngeal exudate.  Eyes: Conjunctivae are normal. Pupils are equal, round, and reactive to light. Right eye exhibits no discharge. Left eye exhibits no discharge.  Neck: No tracheal deviation present. No thyromegaly present.  Cardiovascular: Normal rate, regular rhythm, normal heart sounds and intact distal pulses.  Exam reveals no gallop and no friction rub.   No murmur heard. Pulmonary/Chest: Effort normal and breath sounds normal. No respiratory distress. He has no wheezes. He has no rales. He exhibits no tenderness.  Abdominal: Soft. Bowel sounds are normal. He exhibits no distension. There is no tenderness. There is no rebound and no guarding.  Musculoskeletal: Normal range of motion.  Lymphadenopathy:    He has no cervical adenopathy.  Neurological: He is alert and oriented to person, place, and time. No cranial nerve deficit. Coordination normal.  Skin: Skin is warm and dry. No rash noted. He is not diaphoretic. No erythema. No pallor.  Psychiatric: He has a normal mood and affect. Judgment and thought content normal.  Nursing note and vitals  reviewed.     Assessment & Plan:  1. Routine general medical examination at a health care facility - Follow up in one year  - Continue to stay active  - Basic metabolic panel - CBC with Differential/Platelet - Hemoglobin A1c - Hepatic function panel - Lipid panel - PSA - TSH - Microalbumin / creatinine urine ratio  2. Type 2 diabetes mellitus without complication, without long-term current use of insulin (HCC) - Consider adding Metformin  - Basic metabolic panel - CBC with Differential/Platelet - Hemoglobin A1c - Hepatic function panel - Lipid panel - PSA - TSH - Microalbumin / creatinine urine ratio  3. Hypothyroidism, unspecified type - Consider increasing Synthroid  - Basic metabolic panel - CBC with Differential/Platelet - Hemoglobin A1c - Hepatic function panel -  Lipid panel - PSA - TSH - Microalbumin / creatinine urine ratio  4. Essential hypertension, benign - Going to have him decrease lisinopril to 5 mg  - Basic metabolic panel - CBC with Differential/Platelet - Hemoglobin A1c - Hepatic function panel - Lipid panel - PSA - TSH - Microalbumin / creatinine urine ratio  5. Mixed hyperlipidemia - Consider increasing Statin  - Basic metabolic panel - CBC with Differential/Platelet - Hemoglobin A1c - Hepatic function panel - Lipid panel - PSA - TSH - Microalbumin / creatinine urine ratio  6. Screening for depression - Negative for depression    [name], NP

## 2017-04-12 NOTE — Patient Instructions (Addendum)
It was great seeing you today   Your blood pressure is low today. I am going to have you taker 5 mg in the morning   Please find out if the [facility] gave you your pneumonia shots

## 2017-04-13 DIAGNOSIS — M545 Low back pain: Secondary | ICD-10-CM | POA: Diagnosis not present

## 2017-04-13 DIAGNOSIS — Z135 Encounter for screening for eye and ear disorders: Secondary | ICD-10-CM | POA: Diagnosis not present

## 2017-04-13 DIAGNOSIS — M5416 Radiculopathy, lumbar region: Secondary | ICD-10-CM | POA: Diagnosis not present

## 2017-05-16 DIAGNOSIS — I10 Essential (primary) hypertension: Secondary | ICD-10-CM | POA: Diagnosis not present

## 2017-05-16 DIAGNOSIS — M5416 Radiculopathy, lumbar region: Secondary | ICD-10-CM | POA: Diagnosis not present

## 2017-06-24 ENCOUNTER — Other Ambulatory Visit: Payer: Self-pay | Admitting: Student

## 2017-06-24 DIAGNOSIS — M5416 Radiculopathy, lumbar region: Secondary | ICD-10-CM

## 2017-06-26 DIAGNOSIS — I10 Essential (primary) hypertension: Secondary | ICD-10-CM | POA: Diagnosis not present

## 2017-06-26 DIAGNOSIS — G8929 Other chronic pain: Secondary | ICD-10-CM | POA: Diagnosis not present

## 2017-06-26 DIAGNOSIS — Z79899 Other long term (current) drug therapy: Secondary | ICD-10-CM | POA: Diagnosis not present

## 2017-06-26 DIAGNOSIS — M25562 Pain in left knee: Secondary | ICD-10-CM | POA: Diagnosis not present

## 2017-06-26 DIAGNOSIS — S8992XA Unspecified injury of left lower leg, initial encounter: Secondary | ICD-10-CM | POA: Diagnosis not present

## 2017-06-26 DIAGNOSIS — Z7982 Long term (current) use of aspirin: Secondary | ICD-10-CM | POA: Diagnosis not present

## 2017-06-29 DIAGNOSIS — M25562 Pain in left knee: Secondary | ICD-10-CM | POA: Diagnosis not present

## 2017-06-29 DIAGNOSIS — G8929 Other chronic pain: Secondary | ICD-10-CM | POA: Diagnosis not present

## 2017-06-29 DIAGNOSIS — Z7982 Long term (current) use of aspirin: Secondary | ICD-10-CM | POA: Diagnosis not present

## 2017-06-29 DIAGNOSIS — M11262 Other chondrocalcinosis, left knee: Secondary | ICD-10-CM | POA: Diagnosis not present

## 2017-06-29 DIAGNOSIS — E119 Type 2 diabetes mellitus without complications: Secondary | ICD-10-CM | POA: Diagnosis not present

## 2017-06-29 DIAGNOSIS — I10 Essential (primary) hypertension: Secondary | ICD-10-CM | POA: Diagnosis not present

## 2017-06-29 DIAGNOSIS — Z79899 Other long term (current) drug therapy: Secondary | ICD-10-CM | POA: Diagnosis not present

## 2017-07-07 ENCOUNTER — Other Ambulatory Visit: Payer: PPO

## 2017-07-18 DIAGNOSIS — Z86010 Personal history of colonic polyps: Secondary | ICD-10-CM | POA: Diagnosis not present

## 2017-07-18 DIAGNOSIS — K5520 Angiodysplasia of colon without hemorrhage: Secondary | ICD-10-CM | POA: Diagnosis not present

## 2017-07-18 DIAGNOSIS — I10 Essential (primary) hypertension: Secondary | ICD-10-CM | POA: Diagnosis not present

## 2017-08-05 NOTE — Progress Notes (Signed)
Subjective:   Todd Watson is a 76 y.o. male who presents for Medicare Annual/Subsequent preventive examination.  The Patient was informed that the wellness visit is to identify future health risk and educate and initiate measures that can reduce risk for increased disease through the lifespan.    Annual Wellness Assessment  Reports health as good   Preventive Screening -Counseling & Management  Medicare Annual Preventive Care Visit - Subsequent Last OV 04/12/2017 No colonoscopy; aged out  Health Maintenance Due  Topic Date Due  . TETANUS/TDAP  11/11/1957  . PNA vac Low Risk Adult (2 of 2 - PPSV23) 07/05/2015    Needs tetanus but not sure when he had one Thinks it was 76 yo Is expecting grand baby;   [facility]  [facility]; has hearing aids   Checks feet at the [facility] PSA 04/2017   VS reviewed;   Diet Mcdonalds; Oatmeal;   [employer]jangles green beans. 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   Personal safety issues reviewed:  1. Consider starting a community watch program per [facility] 2.  Changes batteries is smoke detector and/or carbon monoxide detector  3.  If you have firearms; keep them in a safe place 4.  Wear protection when in the sun; Always wear sunscreen or a hat; It is good to have your doctor check your skin annually or review any new areas of concern 5. Driving safety; Keep in the right lane; stay 3 car lengths behind the car in front of you on the highway; look 3 times prior to pulling out; carry your cell phone everywhere you go!     Stressors: loves to  fish   Sleep patterns: sleeps well  Pain; sciatic nerve pain down his leg if he lifts something heavy    Cardiac Risk Factors Addressed Hyperlipidemia - improved with chol /hdl ratio 3; col 154; hdl 44; ldl 86; trig 120 Diabetes - glucose 104 and A1c 6.8    Advanced Directives  Patient Care Team: [name], NP as PCP - General (Family Medicine)   Cardiac Risk Factors include: advanced age (>70men, >49 women);dyslipidemia;family history of premature cardiovascular disease;hypertension;male gender     Objective:    Vitals: BP 126/60   Pulse 70   Ht 5\' 7"  (1.702 m)   Wt 153 lb 7 oz (69.6 kg)   SpO2 96%   BMI 24.03 kg/m   Body mass index is 24.03 kg/m.  Tobacco Social History   Tobacco Use  Smoking Status Never Smoker  Smokeless Tobacco Never Used     Counseling given: Yes   Past Medical History:  Diagnosis Date  . Arthritis   . Diabetes type 2, controlled ([place])   . Diverticulitis   . GERD (gastroesophageal reflux disease)   . Herpes zoster   . Hiatal hernia   . History of kidney stones   . Hypothyroidism   . Osteoarthritis of lumbar spine    Past Surgical History:  Procedure Laterality Date  . APPENDECTOMY  2000  . INGUINAL  HERNIA REPAIR Bilateral   . SPINE SURGERY     spurs   Family History  Problem Relation Age of Onset  . Heart disease Father        Valve disease  . Kidney failure Mother    Social History   Substance and Sexual Activity  Sexual Activity Not on file    Outpatient Encounter Medications as of 08/09/2017  Medication Sig  . aspirin 81 MG tablet Take 81 mg by mouth daily.  Marland Kitchen atorvastatin (LIPITOR) 20 MG tablet Take 20 mg by mouth daily.  . cyclobenzaprine (FLEXERIL) 5 MG tablet Take 1 tablet (5 mg total) by mouth 3 (three) times daily as needed for muscle spasms.  Marland Kitchen glucosamine-chondroitin 500-400 MG tablet Take 1 tablet by mouth daily.  Marland Kitchen levothyroxine (SYNTHROID, LEVOTHROID) 125 MCG tablet Take 1 tablet (125 mcg total) by  mouth daily before breakfast.  . lisinopril (PRINIVIL,ZESTRIL) 10 MG tablet Take 10 mg by mouth daily.  . Multiple Vitamin (MULTIVITAMIN) tablet Take 1 tablet by mouth daily.  . naproxen sodium (ANAPROX) 220 MG tablet Take 220 mg by mouth as needed.  . Omega-3 Fatty Acids (FISH OIL) 1200 MG CAPS Take by mouth.  Marland Kitchen omeprazole (PRILOSEC) 40 MG capsule Take 40 mg by mouth daily.  . Probiotic Product (PROBIOTIC DAILY PO) Take by mouth.  . vitamin C (ASCORBIC ACID) 500 MG tablet Take 500 mg by mouth daily.   No facility-administered encounter medications on file as of 08/09/2017.     Activities of Daily Living In your present state of health, do you have any difficulty performing the following activities: 08/09/2017  Hearing? Y  Difficulty concentrating or making decisions? N  Walking or climbing stairs? N  Comment basement  Dressing or bathing? N  Doing errands, shopping? N  Preparing Food and eating ? N  Using the Toilet? N  In the past six months, have you accidently leaked urine? N  Do you have problems with loss of bowel control? N  Managing your Medications? N  Managing your Finances? N  Housekeeping or managing your Housekeeping? N  Some recent data might be hidden    Patient Care Team: [name], NP as PCP - General (Family Medicine)    Dr. Arther D[facility]:     Exercise Activities and Dietary recommendations Current Exercise Habits: Home exercise routine, Type of exercise: walking, Time (Minutes): 45, Frequency (Times/Week): 4, Weekly Exercise (Minutes/Week): 180, Intensity: Moderate  Goals    None     Fall Risk Fall Risk  08/09/2017 04/12/2017 07/31/2015 07/23/2014  Falls in the past year? No No No No   Depression Screen PHQ 2/9 Scores 08/09/2017 04/12/2017 07/31/2015 07/23/2014  PHQ - 2 Score 0 0 0 0    Cognitive Function   Ad8 score reviewed for issues:  Issues making decisions:  Less interest in hobbies /  activities:  Repeats questions, stories (family complaining):  Trouble using ordinary gadgets (microwave, computer, phone):  Forgets the month or year:   Mismanaging finances:   Remembering appts:  Daily problems with thinking and/or memory: Ad8 score is=0          Immunization History  Administered Date(s) Administered  . Influenza, High Dose Seasonal PF 07/31/2015, 07/04/2017  . Influenza,inj,Quad PF,6+ Mos 07/04/2014  . Pneumococcal Conjugate-13 07/04/2014  . Zoster Recombinat (Shingrix) 04/03/2017, 07/04/2017   Screening Tests Health Maintenance  Topic Date Due  . TETANUS/TDAP  11/11/1957  . PNA vac Low Risk Adult (  2 of 2 - PPSV23) 07/05/2015  . OPHTHALMOLOGY EXAM  01/02/2018 (Originally 09/04/2015)  . HEMOGLOBIN A1C  10/13/2017  . FOOT EXAM  04/12/2018  . INFLUENZA VACCINE  Completed      Plan:       PCP Notes  Health Maintenance The next time you go to the [facility], please get the date of your pneumonvax (pneumonia vaccine 23 )  You had the prevnar 13 here  Also get the Tdap (tetanus pertussis)   Has had shingrix; had series of 2 at the [facility] [place]; June or July and October    Eye exam; [facility] does these in April of this year; no Diabetic retinopathy  If your annual at the [facility] finds you in Diabetic range, then you may want to consider treatment  Educated regarding pre diabetes and his numbers   Abnormal Screens  None   Referrals  None  Patient concerns; Taking pertussis as new baby coming   Nurse Concerns; As noted   Next PCP apt Seen in July, Sees [facility] [place] in Feb or March and will have them recheck BS     I have personally reviewed and noted the following in the patient's chart:   . Medical and social history . Use of alcohol, tobacco or illicit drugs  . Current medications and supplements . Functional ability and status . Nutritional status . Physical activity . Advanced directives . List of other physicians . Hospitalizations, surgeries, and ER  visits in previous 12 months . Vitals . Screenings to include cognitive, depression, and falls . Referrals and appointments  In addition, I have reviewed and discussed with patient certain preventive protocols, quality metrics, and best practice recommendations. A written personalized care plan for preventive services as well as general preventive health recommendations were provided to patient.     Wynetta Fines, RN  08/09/2017

## 2017-08-09 ENCOUNTER — Ambulatory Visit (INDEPENDENT_AMBULATORY_CARE_PROVIDER_SITE_OTHER): Payer: PPO

## 2017-08-09 VITALS — BP 126/60 | HR 70 | Ht 67.0 in | Wt 153.4 lb

## 2017-08-09 DIAGNOSIS — Z0000 Encounter for general adult medical examination without abnormal findings: Secondary | ICD-10-CM | POA: Diagnosis not present

## 2017-08-09 NOTE — Progress Notes (Signed)
I have reviewed and agree with this plan  

## 2017-08-09 NOTE — Patient Instructions (Addendum)
Todd Watson , Thank you for taking time to come for your Medicare Wellness Visit. I appreciate your ongoing commitment to your health goals. Please review the following plan we discussed and let me know if I can assist you in the future.   The next time you go to the [place], please get the date of your pneumonvax (pneumonia vaccine 23 )  You had the prevnar 13 here  Also get the Tdap (tetanus pertussis)   Has had shingrix; had series of 2 at the [place]; June or July and October    Eye exam; [place] does these in April of this year; no Diabetic retinopathy  If your annual at the [place] finds you in Diabetic range, then you may want to consider treatment   Prediabetes Prediabetes is the condition of having a blood sugar (blood glucose) level that is higher than it should be, but not high enough for you to be diagnosed with type 2 diabetes. Having prediabetes puts you at risk for developing type 2 diabetes (type 2 diabetes mellitus). Prediabetes may be called impaired glucose tolerance or impaired fasting glucose. Prediabetes usually does not cause symptoms. Your health care provider can diagnose this condition with blood tests. You may be tested for prediabetes if you are overweight and if you have at least one other risk factor for prediabetes. Risk factors for prediabetes include:  Having a family member with type 2 diabetes.  Being overweight or obese.  Being older than age 24.  Being of American-Indian, African-American, Hispanic/Latino, or Asian/Pacific Islander descent.  Having an inactive (sedentary) lifestyle.  Having a history of gestational diabetes or polycystic ovarian syndrome (PCOS).  Having low levels of good cholesterol (HDL-C) or high levels of blood fats (triglycerides).  Having high blood pressure.  What is blood glucose and how is blood glucose measured?  Blood glucose refers to the amount of glucose in your bloodstream. Glucose comes from eating foods that contain sugars and  starches (carbohydrates) that the body breaks down into glucose. Your blood glucose level may be measured in mg/dL (milligrams per deciliter) or mmol/L (millimoles per liter).Your blood glucose may be checked with one or more of the following blood tests:  A fasting blood glucose (FBG) test. You will not be allowed to eat (you will fast) for at least 8 hours before a blood sample is taken. ? A normal range for FBG is 70-100 mg/dl (3.9-5.6 mmol/L).  An A1c (hemoglobin A1c) blood test. This test provides information about blood glucose control over the previous 2?68months.  An oral glucose tolerance test (OGTT). This test measures your blood glucose twice: ? After fasting. This is your baseline level. ? Two hours after you drink a beverage that contains glucose.  You may be diagnosed with prediabetes:  If your FBG is 100?125 mg/dL (5.6-6.9 mmol/L).  If your A1c level is 5.7?6.4%.  If your OGGT result is 140?199 mg/dL (7.8-11 mmol/L).  These blood tests may be repeated to confirm your diagnosis. What happens if blood glucose is too high? The pancreas produces a hormone (insulin) that helps move glucose from the bloodstream into cells. When cells in the body do not respond properly to insulin that the body makes (insulin resistance), excess glucose builds up in the blood instead of going into cells. As a result, high blood glucose (hyperglycemia) can develop, which can cause many complications. This is a symptom of prediabetes. What can happen if blood glucose stays higher than normal for a long time? Having high blood  glucose for a long time is dangerous. Too much glucose in your blood can damage your nerves and blood vessels. Long-term damage can lead to complications from diabetes, which may include:  Heart disease.  Stroke.  Blindness.  Kidney disease.  Depression.  Poor circulation in the feet and legs, which could lead to surgical removal (amputation) in severe cases.  How can  prediabetes be prevented from turning into type 2 diabetes?  To help prevent type 2 diabetes, take the following actions:  Be physically active. ? Do moderate-intensity physical activity for at least 30 minutes on at least 5 days of the week, or as much as told by your health care provider. This could be brisk walking, biking, or water aerobics. ? Ask your health care provider what activities are safe for you. A mix of physical activities may be best, such as walking, swimming, cycling, and strength training.  Lose weight as told by your health care provider. ? Losing 5-7% of your body weight can reverse insulin resistance. ? Your health care provider can determine how much weight loss is best for you and can help you lose weight safely.  Follow a healthy meal plan. This includes eating lean proteins, complex carbohydrates, fresh fruits and vegetables, low-fat dairy products, and healthy fats. ? Follow instructions from your health care provider about eating or drinking restrictions. ? Make an appointment to see a diet and nutrition specialist (registered dietitian) to help you create a healthy eating plan that is right for you.  Do not smoke or use any tobacco products, such as cigarettes, chewing tobacco, and e-cigarettes. If you need help quitting, ask your health care provider.  Take over-the-counter and prescription medicines as told by your health care provider. You may be prescribed medicines that help lower the risk of type 2 diabetes.  This information is not intended to replace advice given to you by your health care provider. Make sure you discuss any questions you have with your health care provider. Document Released: 01/12/2016 Document Revised: 02/26/2016 Document Reviewed: 11/11/2015 Elsevier Interactive Patient Education  [employer].   Results for Todd Watson, Todd Watson (MRN [id number]) as of 08/09/2017 08:24  Ref. Range 06/13/2009 16:32 07/05/2014 09:56 01/09/2015 08:50  07/31/2015 10:28 04/12/2017 10:28  Glucose Latest Ref Range: 70 - 99 mg/dL 131 (H) 98 122 (H) 134 (H) 104 (H)  Hemoglobin A1C Latest Ref Range: 4.6 - 6.5 %  6.2  6.2 6.8 (H)    Will try to complete AD; Given copy  Referred to [facility] for questions [place] offers free advance directive forms, as well as assistance in completing the forms themselves. For assistance, contact the Spiritual Care Department at [phone], or the Clinical Social Work Department at [phone].     These are the goals we discussed: Goals    None      This is a list of the screening recommended for you and due dates:  Health Maintenance  Topic Date Due  . Tetanus Vaccine  11/11/1959  . Pneumonia vaccines (2 of 2 - PPSV23) 07/05/2015  . Eye exam for diabetics  09/04/2015  . Flu Shot  05/04/2017  . Hemoglobin A1C  10/13/2017  . Complete foot exam   04/12/2018      Screening for Type 2 Diabetes A screening test for type 2 diabetes (type 2 diabetes mellitus) is a blood test to measure your blood sugar (glucose) level. This test is done to check for early signs of diabetes, before you develop symptoms.  Type 2 diabetes is a long-term (chronic) disease that occurs when the pancreas does not make enough of a hormone called insulin. This results in high blood glucose levels, which can cause many complications. You may be screened for type 2 diabetes as part of your regular health care, especially if you have a high risk for diabetes. Screening can help identify type 2 diabetes at its early stage (prediabetes). Identifying and treating prediabetes may delay or prevent development of type 2 diabetes. What are the risk factors for type 2 diabetes? The following factors may make you more likely to develop type 2 diabetes:  Having a parent or sibling (first-degree relative) who has diabetes.  Being overweight or obese.  Being of American-Indian, [place], Hispanic, Latino, Asian, or African-American  descent.  Not getting enough exercise.  Being older than 62.  Having a history of diabetes during pregnancy (gestational diabetes).  Having low levels of good cholesterol (HDL-C) or high levels of blood fats (triglycerides).  Having high blood glucose in a previous blood test.  Having high blood pressure.  Having certain diseases or conditions, including: ? Acanthosis nigricans. This is a condition that causes dark skin on the neck, armpits, and groin. ? Polycystic ovary syndrome (PCOS). ? Heart disease.  Having delivered a baby who weighed more than 9 lb (4.1 kg).  Who should be screened for type 2 diabetes? Adults  Adults age 48 and older. These adults should be screened at least once every three years.  Adults who are younger than 37, overweight, and have at least one other risk factor. These adults should be screened at least once every three years.  Adults who have normal blood glucose levels and two or more risk factors. These adults may be screened once every year (annually).  Women who have had gestational diabetes in the past. These women should be screened at least once every three years.  Pregnant women who have risk factors. These women should be screened at their first prenatal visit.  Pregnant women with no risk factors. These women should be screened between weeks 24 and 28 of pregnancy. Children and adolescents  Children and adolescents should be screened for type 2 diabetes if they are overweight and have 2 of the following risk factors: ? A family history of type 2 diabetes. ? Being a member of a high risk race or ethnic group. ? Signs of insulin resistance or conditions associated with insulin resistance. ? A mother who had gestational diabetes while pregnant with him or her.  Screening should be done at least once every three years, starting at age 29. Your health care provider or your child's health care provider may recommend having a screening more  or less often. What happens during screening? During screening, your health care provider may ask questions about:  Your health and your risk factors, including your activity level and any medical conditions that you have.  The health of your first-degree relatives.  Past pregnancies, if this applies.  Your health care provider will also do a physical exam, including a blood pressure measurement and blood tests. There are four blood tests that can be used to screen for type 2 diabetes. You may have one or more of the following:  A fasting plasma glucose test (FBG). You will not be allowed to eat for at least eight hours before a blood sample is taken.  A random blood glucose test. This test checks your blood glucose at any time of the day regardless  of when you ate.  An oral glucose tolerance test (OGTT). This test measures your blood glucose at two times: ? After you have not eaten (have fasted) overnight. ? Two hours after you drink a glucose-containing beverage. A diagnosis can be made if the level is greater than 200 mg/dL.  An A1c test. This test provides information about blood glucose control over the previous three months.  What do the results mean? Your test results are a measurement of how much glucose is in your blood. Normal blood glucose levels mean that you do not have diabetes or prediabetes. High blood glucose levels may mean that you have prediabetes or diabetes. Depending on the results, other tests may be needed to confirm the diagnosis. This information is not intended to replace advice given to you by your health care provider. Make sure you discuss any questions you have with your health care provider. Document Released: 07/17/2009 Document Revised: 02/26/2016 Document Reviewed: 07/18/2015 Elsevier Interactive Patient Education  2017 [place] Prevention in the Home Falls can cause injuries. They can happen to people of all ages. There are many things  you can do to make your home safe and to help prevent falls. What can I do on the outside of my home?  Regularly fix the edges of walkways and driveways and fix any cracks.  Remove anything that might make you trip as you walk through a door, such as a raised step or threshold.  Trim any bushes or trees on the path to your home.  Use bright outdoor lighting.  Clear any walking paths of anything that might make someone trip, such as rocks or tools.  Regularly check to see if handrails are loose or broken. Make sure that both sides of any steps have handrails.  Any raised decks and porches should have guardrails on the edges.  Have any leaves, snow, or ice cleared regularly.  Use sand or salt on walking paths during winter.  Clean up any spills in your garage right away. This includes oil or grease spills. What can I do in the bathroom?  Use night lights.  Install grab bars by the toilet and in the tub and shower. Do not use towel bars as grab bars.  Use non-skid mats or decals in the tub or shower.  If you need to sit down in the shower, use a plastic, non-slip stool.  Keep the floor dry. Clean up any water that spills on the floor as soon as it happens.  Remove soap buildup in the tub or shower regularly.  Attach bath mats securely with double-sided non-slip rug tape.  Do not have throw rugs and other things on the floor that can make you trip. What can I do in the bedroom?  Use night lights.  Make sure that you have a light by your bed that is easy to reach.  Do not use any sheets or blankets that are too big for your bed. They should not hang down onto the floor.  Have a firm chair that has side arms. You can use this for support while you get dressed.  Do not have throw rugs and other things on the floor that can make you trip. What can I do in the kitchen?  Clean up any spills right away.  Avoid walking on wet floors.  Keep items that you use a lot in  easy-to-reach places.  If you need to reach something above you, use a strong step  stool that has a grab bar.  Keep electrical cords out of the way.  Do not use floor polish or wax that makes floors slippery. If you must use wax, use non-skid floor wax.  Do not have throw rugs and other things on the floor that can make you trip. What can I do with my stairs?  Do not leave any items on the stairs.  Make sure that there are handrails on both sides of the stairs and use them. Fix handrails that are broken or loose. Make sure that handrails are as long as the stairways.  Check any carpeting to make sure that it is firmly attached to the stairs. Fix any carpet that is loose or worn.  Avoid having throw rugs at the top or bottom of the stairs. If you do have throw rugs, attach them to the floor with carpet tape.  Make sure that you have a light switch at the top of the stairs and the bottom of the stairs. If you do not have them, ask someone to add them for you. What else can I do to help prevent falls?  Wear shoes that: ? Do not have high heels. ? Have rubber bottoms. ? Are comfortable and fit you well. ? Are closed at the toe. Do not wear sandals.  If you use a stepladder: ? Make sure that it is fully opened. Do not climb a closed stepladder. ? Make sure that both sides of the stepladder are locked into place. ? Ask someone to hold it for you, if possible.  Clearly mark and make sure that you can see: ? Any grab bars or handrails. ? First and last steps. ? Where the edge of each step is.  Use tools that help you move around (mobility aids) if they are needed. These include: ? Canes. ? Walkers. ? Scooters. ? Crutches.  Turn on the lights when you go into a dark area. Replace any light bulbs as soon as they burn out.  Set up your furniture so you have a clear path. Avoid moving your furniture around.  If any of your floors are uneven, fix them.  If there are any pets  around you, be aware of where they are.  Review your medicines with your doctor. Some medicines can make you feel dizzy. This can increase your chance of falling. Ask your doctor what other things that you can do to help prevent falls. This information is not intended to replace advice given to you by your health care provider. Make sure you discuss any questions you have with your health care provider. Document Released: 07/17/2009 Document Revised: 02/26/2016 Document Reviewed: 10/25/2014 Elsevier Interactive Patient Education  2018 [place] Maintenance, Male A healthy lifestyle and preventive care is important for your health and wellness. Ask your health care provider about what schedule of regular examinations is right for you. What should I know about weight and diet? Eat a Healthy Diet  Eat plenty of vegetables, fruits, whole grains, low-fat dairy products, and lean protein.  Do not eat a lot of foods high in solid fats, added sugars, or salt.  Maintain a Healthy Weight Regular exercise can help you achieve or maintain a healthy weight. You should:  Do at least 150 minutes of exercise each week. The exercise should increase your heart rate and make you sweat (moderate-intensity exercise).  Do strength-training exercises at least twice a week.  Watch Your Levels of Cholesterol and Blood Lipids  Have your blood tested for lipids and cholesterol every 5 years starting at 78 years of age. If you are at high risk for heart disease, you should start having your blood tested when you are 78 years old. You may need to have your cholesterol levels checked more often if: ? Your lipid or cholesterol levels are high. ? You are older than 78 years of age. ? You are at high risk for heart disease.  What should I know about cancer screening? Many types of cancers can be detected early and may often be prevented. Lung Cancer  You should be screened every year for lung cancer  if: ? You are a current smoker who has smoked for at least 30 years. ? You are a former smoker who has quit within the past 15 years.  Talk to your health care provider about your screening options, when you should start screening, and how often you should be screened.  Colorectal Cancer  Routine colorectal cancer screening usually begins at 78 years of age and should be repeated every 5-10 years until you are 78 years old. You may need to be screened more often if early forms of precancerous polyps or small growths are found. Your health care provider may recommend screening at an earlier age if you have risk factors for colon cancer.  Your health care provider may recommend using home test kits to check for hidden blood in the stool.  A small camera at the end of a tube can be used to examine your colon (sigmoidoscopy or colonoscopy). This checks for the earliest forms of colorectal cancer.  Prostate and Testicular Cancer  Depending on your age and overall health, your health care provider may do certain tests to screen for prostate and testicular cancer.  Talk to your health care provider about any symptoms or concerns you have about testicular or prostate cancer.  Skin Cancer  Check your skin from head to toe regularly.  Tell your health care provider about any new moles or changes in moles, especially if: ? There is a change in a mole's size, shape, or color. ? You have a mole that is larger than a pencil eraser.  Always use sunscreen. Apply sunscreen liberally and repeat throughout the day.  Protect yourself by wearing long sleeves, pants, a wide-brimmed hat, and sunglasses when outside.  What should I know about heart disease, diabetes, and high blood pressure?  If you are 32-22 years of age, have your blood pressure checked every 3-5 years. If you are 64 years of age or older, have your blood pressure checked every year. You should have your blood pressure measured  twice-once when you are at a hospital or clinic, and once when you are not at a hospital or clinic. Record the average of the two measurements. To check your blood pressure when you are not at a hospital or clinic, you can use: ? An automated blood pressure machine at a pharmacy. ? A home blood pressure monitor.  Talk to your health care provider about your target blood pressure.  If you are between 69-18 years old, ask your health care provider if you should take aspirin to prevent heart disease.  Have regular diabetes screenings by checking your fasting blood sugar level. ? If you are at a normal weight and have a low risk for diabetes, have this test once every three years after the age of 19. ? If you are overweight and have a high risk for diabetes,  consider being tested at a younger age or more often.  A one-time screening for abdominal aortic aneurysm (AAA) by ultrasound is recommended for men aged 110-75 years who are current or former smokers. What should I know about preventing infection? Hepatitis B If you have a higher risk for hepatitis B, you should be screened for this virus. Talk with your health care provider to find out if you are at risk for hepatitis B infection. Hepatitis C Blood testing is recommended for:  Everyone born from [birth year] through 1967.  Anyone with known risk factors for hepatitis C.  Sexually Transmitted Diseases (STDs)  You should be screened each year for STDs including gonorrhea and chlamydia if: ? You are sexually active and are younger than 78 years of age. ? You are older than 78 years of age and your health care provider tells you that you are at risk for this type of infection. ? Your sexual activity has changed since you were last screened and you are at an increased risk for chlamydia or gonorrhea. Ask your health care provider if you are at risk.  Talk with your health care provider about whether you are at high risk of being infected with HIV.  Your health care provider may recommend a prescription medicine to help prevent HIV infection.  What else can I do?  Schedule regular health, dental, and eye exams.  Stay current with your vaccines (immunizations).  Do not use any tobacco products, such as cigarettes, chewing tobacco, and e-cigarettes. If you need help quitting, ask your health care provider.  Limit alcohol intake to no more than 2 drinks per day. One drink equals 12 ounces of beer, 5 ounces of wine, or 1 ounces of hard liquor.  Do not use street drugs.  Do not share needles.  Ask your health care provider for help if you need support or information about quitting drugs.  Tell your health care provider if you often feel depressed.  Tell your health care provider if you have ever been abused or do not feel safe at home. This information is not intended to replace advice given to you by your health care provider. Make sure you discuss any questions you have with your health care provider. Document Released: 03/18/2008 Document Revised: 05/19/2016 Document Reviewed: 06/24/2015 Elsevier Interactive Patient Education  [employer].

## 2017-09-15 DIAGNOSIS — R972 Elevated prostate specific antigen [PSA]: Secondary | ICD-10-CM | POA: Diagnosis not present

## 2017-09-15 LAB — PSA: PSA: 1.09

## 2017-09-21 DIAGNOSIS — N481 Balanitis: Secondary | ICD-10-CM | POA: Diagnosis not present

## 2017-09-21 DIAGNOSIS — N401 Enlarged prostate with lower urinary tract symptoms: Secondary | ICD-10-CM | POA: Diagnosis not present

## 2017-09-21 DIAGNOSIS — R351 Nocturia: Secondary | ICD-10-CM | POA: Diagnosis not present

## 2017-09-28 ENCOUNTER — Encounter: Payer: Self-pay | Admitting: Family Medicine

## 2018-01-10 ENCOUNTER — Ambulatory Visit: Payer: PPO | Admitting: Adult Health

## 2018-01-10 ENCOUNTER — Encounter: Payer: Self-pay | Admitting: Adult Health

## 2018-01-10 VITALS — BP 158/80 | Temp 97.6°F | Wt 165.0 lb

## 2018-01-10 DIAGNOSIS — M25561 Pain in right knee: Secondary | ICD-10-CM

## 2018-01-10 NOTE — Progress Notes (Signed)
Subjective:    Patient ID: Todd Watson, male    DOB: 1941/05/03, 77 y.o.   MRN: [id number]  HPI 77 year old male who  has a past medical history of Arthritis, Diabetes type 2, controlled ([place]), Diverticulitis, GERD (gastroesophageal reflux disease), Herpes zoster, Hiatal hernia, History of kidney stones, Hypothyroidism, and Osteoarthritis of lumbar spine.  He presents to the office today the complaint of right knee pain. Reports having steroid injections in the past which helped relieved his pain. Started having right knee pain last week but has not had any pain in the last two days.   He has not noticed any swelling, redness, or warmth. Denies any trauma.   Review of Systems See HPI   Past Medical History:  Diagnosis Date  . Arthritis   . Diabetes type 2, controlled ([place])   . Diverticulitis   . GERD (gastroesophageal reflux disease)   . Herpes zoster   . Hiatal hernia   . History of kidney stones   . Hypothyroidism   . Osteoarthritis of lumbar spine     Social History   Socioeconomic History  . Marital status: Married    Spouse name: Not on file  . Number of children: 3  . Years of education: Not on file  . Highest education level: Not on file  Occupational History  . Not on file  Social Needs  . Financial resource strain: Not on file  . Food insecurity:    Worry: Not on file    Inability: Not on file  . Transportation needs:    Medical: Not on file    Non-medical: Not on file  Tobacco Use  . Smoking status: Never Smoker  . Smokeless tobacco: Never Used  Substance and Sexual Activity  . Alcohol use: No  . Drug use: No  . Sexual activity: Not on file  Lifestyle  . Physical activity:    Days per week: Not on file    Minutes per session: Not on file  . Stress: Not on file  Relationships  . Social connections:    Talks on phone: Not on file    Gets together: Not on file    Attends religious service: Not on file    Active member of club or organization:  Not on file    Attends meetings of clubs or organizations: Not on file    Relationship status: Not on file  . Intimate partner violence:    Fear of current or ex partner: Not on file    Emotionally abused: Not on file    Physically abused: Not on file    Forced sexual activity: Not on file  Other Topics Concern  . Not on file  Social History Narrative   Lives with wife.  6 grandchildren.      Past Surgical History:  Procedure Laterality Date  . APPENDECTOMY  2000  . INGUINAL HERNIA REPAIR Bilateral   . SPINE SURGERY     spurs    Family History  Problem Relation Age of Onset  . Heart disease Father        Valve disease  . Kidney failure Mother     No Known Allergies  Current Outpatient Medications on File Prior to Visit  Medication Sig Dispense Refill  . aspirin 81 MG tablet Take 81 mg by mouth daily.    Marland Kitchen atorvastatin (LIPITOR) 20 MG tablet Take 20 mg by mouth daily.    Marland Kitchen glucosamine-chondroitin 500-400 MG tablet Take 1 tablet  by mouth daily.    Marland Kitchen levothyroxine (SYNTHROID, LEVOTHROID) 125 MCG tablet Take 1 tablet (125 mcg total) by mouth daily before breakfast. 90 tablet 3  . lisinopril (PRINIVIL,ZESTRIL) 10 MG tablet Take 5 mg by mouth 2 (two) times daily.     . Multiple Vitamin (MULTIVITAMIN) tablet Take 1 tablet by mouth daily.    . naproxen sodium (ANAPROX) 220 MG tablet Take 220 mg by mouth as needed.    . Omega-3 Fatty Acids (FISH OIL) 1200 MG CAPS Take by mouth.    Marland Kitchen omeprazole (PRILOSEC) 40 MG capsule Take 40 mg by mouth daily.    . Probiotic Product (PROBIOTIC DAILY PO) Take by mouth.    . vitamin C (ASCORBIC ACID) 500 MG tablet Take 500 mg by mouth daily.    . sildenafil (VIAGRA) 100 MG tablet Take by mouth.     No current facility-administered medications on file prior to visit.     BP (!) 158/80 (BP Location: Left Arm)   Temp 97.6 F (36.4 C) (Oral)   Wt 165 lb (74.8 kg)   BMI 25.84 kg/m       Objective:   Physical Exam  Constitutional: He is  oriented to person, place, and time. He appears well-developed and well-nourished. No distress.  Cardiovascular: Normal rate, regular rhythm, normal heart sounds and intact distal pulses. Exam reveals no gallop and no friction rub.  No murmur heard. Pulmonary/Chest: Effort normal and breath sounds normal. No respiratory distress. He has no wheezes. He has no rales. He exhibits no tenderness.  Musculoskeletal: Normal range of motion. He exhibits no edema or tenderness.  Neurological: He is alert and oriented to person, place, and time.  Skin: Skin is warm and dry. No rash noted. He is not diaphoretic. No erythema. No pallor.  Psychiatric: He has a normal mood and affect. His behavior is normal. Judgment and thought content normal.  Nursing note and vitals reviewed.     Assessment & Plan:  1. Acute pain of right knee - No need for steroid injection at this time  - Can take motrin and use ice if pain restarts  - Follow up as needed  [name], NP

## 2018-03-30 DIAGNOSIS — H6123 Impacted cerumen, bilateral: Secondary | ICD-10-CM | POA: Diagnosis not present

## 2018-07-03 DIAGNOSIS — M25552 Pain in left hip: Secondary | ICD-10-CM | POA: Diagnosis not present

## 2018-07-03 DIAGNOSIS — M160 Bilateral primary osteoarthritis of hip: Secondary | ICD-10-CM | POA: Diagnosis not present

## 2018-07-03 DIAGNOSIS — M4726 Other spondylosis with radiculopathy, lumbar region: Secondary | ICD-10-CM | POA: Diagnosis not present

## 2018-07-03 DIAGNOSIS — M25551 Pain in right hip: Secondary | ICD-10-CM | POA: Diagnosis not present

## 2018-08-10 ENCOUNTER — Ambulatory Visit: Payer: PPO

## 2018-08-25 ENCOUNTER — Ambulatory Visit: Payer: PPO

## 2018-08-29 ENCOUNTER — Ambulatory Visit: Payer: PPO

## 2018-09-04 ENCOUNTER — Ambulatory Visit (INDEPENDENT_AMBULATORY_CARE_PROVIDER_SITE_OTHER): Payer: PPO

## 2018-09-04 VITALS — BP 138/60 | HR 46 | Ht 67.0 in | Wt 164.0 lb

## 2018-09-04 DIAGNOSIS — Z0000 Encounter for general adult medical examination without abnormal findings: Secondary | ICD-10-CM | POA: Diagnosis not present

## 2018-09-04 NOTE — Patient Instructions (Addendum)
Todd Watson , Thank you for taking time to come for your Medicare Wellness Visit. I appreciate your ongoing commitment to your health goals. Please review the following plan we discussed and let me know if I can assist you in the future.   Will make an apt with Tommi Rumps to evaluate your new back discomfort   [facility] may have completed Tdap (tetanus with the pertussis) You need one of these as an adult  We also do not have you getting a PSV 23 (pneumovax) after 65, so please check with VA to be sure you got his   VA is checking Thyroid and changed med   Seeing [facility] as well   You BS was elevated last year /it was 6.8 last year; could have been asso with injection if you got it  Educated regarding prediabetes and numbers;  A1c ranges from 5.8 to 6.5 or fasting Blood sugar > 115 -126; (126 is diabetic)   Risk: >45yo; family hx; overweight or obese; African American; Hispanic; Latino; American [place] American; [place]; history of diabetes when pregnant; or birth to a baby weighing over 9 lbs. Being less physically active than 30 minutes; 3 times a week;   Prevention; Losing a modest 7 to 8 lbs; If over 200 lbs; 10 to 14 lbs;  Choose healthier foods; colorful veggies; fish or lean meats; drinks water Reduce portion size Start exercising; 30 minutes of fast walking x 30 minutes per day/ 60 min for weight loss   Foot checked in April at the [place]    These are the goals we discussed: Goals    . Exercise 150 minutes per week (moderate activity)     Will start swimming !       This is a list of the screening recommended for you and due dates:  Health Maintenance  Topic Date Due  . Tetanus Vaccine  11/11/1957  . Pneumonia vaccines (2 of 2 - PPSV23) 07/05/2015  . Eye exam for diabetics  09/04/2015  . Hemoglobin A1C  10/13/2017  . Complete foot exam   04/12/2018  . Flu Shot  05/04/2018    Diabetes and Foot Care Diabetes may cause you to have problems because of poor blood supply  (circulation) to your feet and legs. This may cause the skin on your feet to become thinner, break easier, and heal more slowly. Your skin may become dry, and the skin may peel and crack. You may also have nerve damage in your legs and feet causing decreased feeling in them. You may not notice minor injuries to your feet that could lead to infections or more serious problems. Taking care of your feet is one of the most important things you can do for yourself. Follow these instructions at home:  Wear shoes at all times, even in the house. Do not go barefoot. Bare feet are easily injured.  Check your feet daily for blisters, cuts, and redness. If you cannot see the bottom of your feet, use a mirror or ask someone for help.  Wash your feet with warm water (do not use hot water) and mild soap. Then pat your feet and the areas between your toes until they are completely dry. Do not soak your feet as this can dry your skin.  Apply a moisturizing lotion or petroleum jelly (that does not contain alcohol and is unscented) to the skin on your feet and to dry, brittle toenails. Do not apply lotion between your toes.  Trim your toenails  straight across. Do not dig under them or around the cuticle. File the edges of your nails with an emery board or nail file.  Do not cut corns or calluses or try to remove them with medicine.  Wear clean socks or stockings every day. Make sure they are not too tight. Do not wear knee-high stockings since they may decrease blood flow to your legs.  Wear shoes that fit properly and have enough cushioning. To break in new shoes, wear them for just a few hours a day. This prevents you from injuring your feet. Always look in your shoes before you put them on to be sure there are no objects inside.  Do not cross your legs. This may decrease the blood flow to your feet.  If you find a minor scrape, cut, or break in the skin on your feet, keep it and the skin around it clean and  dry. These areas may be cleansed with mild soap and water. Do not cleanse the area with peroxide, alcohol, or iodine.  When you remove an adhesive bandage, be sure not to damage the skin around it.  If you have a wound, look at it several times a day to make sure it is healing.  Do not use heating pads or hot water bottles. They may burn your skin. If you have lost feeling in your feet or legs, you may not know it is happening until it is too late.  Make sure your health care provider performs a complete foot exam at least annually or more often if you have foot problems. Report any cuts, sores, or bruises to your health care provider immediately. Contact a health care provider if:  You have an injury that is not healing.  You have cuts or breaks in the skin.  You have an ingrown nail.  You notice redness on your legs or feet.  You feel burning or tingling in your legs or feet.  You have pain or cramps in your legs and feet.  Your legs or feet are numb.  Your feet always feel cold. Get help right away if:  There is increasing redness, swelling, or pain in or around a wound.  There is a red line that goes up your leg.  Pus is coming from a wound.  You develop a fever or as directed by your health care provider.  You notice a bad smell coming from an ulcer or wound. This information is not intended to replace advice given to you by your health care provider. Make sure you discuss any questions you have with your health care provider. Document Released: 09/17/2000 Document Revised: 02/26/2016 Document Reviewed: 02/27/2013 Elsevier Interactive Patient Education  2017 [employer] Prevention in the Home Falls can cause injuries. They can happen to people of all ages. There are many things you can do to make your home safe and to help prevent falls. What can I do on the outside of my home?  Regularly fix the edges of walkways and driveways and fix any  cracks.  Remove anything that might make you trip as you walk through a door, such as a raised step or threshold.  Trim any bushes or trees on the path to your home.  Use bright outdoor lighting.  Clear any walking paths of anything that might make someone trip, such as rocks or tools.  Regularly check to see if handrails are loose or broken. Make sure that both sides of any  steps have handrails.  Any raised decks and porches should have guardrails on the edges.  Have any leaves, snow, or ice cleared regularly.  Use sand or salt on walking paths during winter.  Clean up any spills in your garage right away. This includes oil or grease spills. What can I do in the bathroom?  Use night lights.  Install grab bars by the toilet and in the tub and shower. Do not use towel bars as grab bars.  Use non-skid mats or decals in the tub or shower.  If you need to sit down in the shower, use a plastic, non-slip stool.  Keep the floor dry. Clean up any water that spills on the floor as soon as it happens.  Remove soap buildup in the tub or shower regularly.  Attach bath mats securely with double-sided non-slip rug tape.  Do not have throw rugs and other things on the floor that can make you trip. What can I do in the bedroom?  Use night lights.  Make sure that you have a light by your bed that is easy to reach.  Do not use any sheets or blankets that are too big for your bed. They should not hang down onto the floor.  Have a firm chair that has side arms. You can use this for support while you get dressed.  Do not have throw rugs and other things on the floor that can make you trip. What can I do in the kitchen?  Clean up any spills right away.  Avoid walking on wet floors.  Keep items that you use a lot in easy-to-reach places.  If you need to reach something above you, use a strong step stool that has a grab bar.  Keep electrical cords out of the way.  Do not use floor  polish or wax that makes floors slippery. If you must use wax, use non-skid floor wax.  Do not have throw rugs and other things on the floor that can make you trip. What can I do with my stairs?  Do not leave any items on the stairs.  Make sure that there are handrails on both sides of the stairs and use them. Fix handrails that are broken or loose. Make sure that handrails are as long as the stairways.  Check any carpeting to make sure that it is firmly attached to the stairs. Fix any carpet that is loose or worn.  Avoid having throw rugs at the top or bottom of the stairs. If you do have throw rugs, attach them to the floor with carpet tape.  Make sure that you have a light switch at the top of the stairs and the bottom of the stairs. If you do not have them, ask someone to add them for you. What else can I do to help prevent falls?  Wear shoes that: ? Do not have high heels. ? Have rubber bottoms. ? Are comfortable and fit you well. ? Are closed at the toe. Do not wear sandals.  If you use a stepladder: ? Make sure that it is fully opened. Do not climb a closed stepladder. ? Make sure that both sides of the stepladder are locked into place. ? Ask someone to hold it for you, if possible.  Clearly mark and make sure that you can see: ? Any grab bars or handrails. ? First and last steps. ? Where the edge of each step is.  Use tools that help you move around (  mobility aids) if they are needed. These include: ? Canes. ? Walkers. ? Scooters. ? Crutches.  Turn on the lights when you go into a dark area. Replace any light bulbs as soon as they burn out.  Set up your furniture so you have a clear path. Avoid moving your furniture around.  If any of your floors are uneven, fix them.  If there are any pets around you, be aware of where they are.  Review your medicines with your doctor. Some medicines can make you feel dizzy. This can increase your chance of falling. Ask your  doctor what other things that you can do to help prevent falls. This information is not intended to replace advice given to you by your health care provider. Make sure you discuss any questions you have with your health care provider. Document Released: 07/17/2009 Document Revised: 02/26/2016 Document Reviewed: 10/25/2014 Elsevier Interactive Patient Education  2018 [place] Maintenance, Male A healthy lifestyle and preventive care is important for your health and wellness. Ask your health care provider about what schedule of regular examinations is right for you. What should I know about weight and diet? Eat a Healthy Diet  Eat plenty of vegetables, fruits, whole grains, low-fat dairy products, and lean protein.  Do not eat a lot of foods high in solid fats, added sugars, or salt.  Maintain a Healthy Weight Regular exercise can help you achieve or maintain a healthy weight. You should:  Do at least 150 minutes of exercise each week. The exercise should increase your heart rate and make you sweat (moderate-intensity exercise).  Do strength-training exercises at least twice a week.  Watch Your Levels of Cholesterol and Blood Lipids  Have your blood tested for lipids and cholesterol every 5 years starting at 79 years of age. If you are at high risk for heart disease, you should start having your blood tested when you are 79 years old. You may need to have your cholesterol levels checked more often if: ? Your lipid or cholesterol levels are high. ? You are older than 79 years of age. ? You are at high risk for heart disease.  What should I know about cancer screening? Many types of cancers can be detected early and may often be prevented. Lung Cancer  You should be screened every year for lung cancer if: ? You are a current smoker who has smoked for at least 30 years. ? You are a former smoker who has quit within the past 15 years.  Talk to your health care provider  about your screening options, when you should start screening, and how often you should be screened.  Colorectal Cancer  Routine colorectal cancer screening usually begins at 79 years of age and should be repeated every 5-10 years until you are 79 years old. You may need to be screened more often if early forms of precancerous polyps or small growths are found. Your health care provider may recommend screening at an earlier age if you have risk factors for colon cancer.  Your health care provider may recommend using home test kits to check for hidden blood in the stool.  A small camera at the end of a tube can be used to examine your colon (sigmoidoscopy or colonoscopy). This checks for the earliest forms of colorectal cancer.  Prostate and Testicular Cancer  Depending on your age and overall health, your health care provider may do certain tests to screen for prostate and testicular cancer.  Talk to your health care provider about any symptoms or concerns you have about testicular or prostate cancer.  Skin Cancer  Check your skin from head to toe regularly.  Tell your health care provider about any new moles or changes in moles, especially if: ? There is a change in a mole's size, shape, or color. ? You have a mole that is larger than a pencil eraser.  Always use sunscreen. Apply sunscreen liberally and repeat throughout the day.  Protect yourself by wearing long sleeves, pants, a wide-brimmed hat, and sunglasses when outside.  What should I know about heart disease, diabetes, and high blood pressure?  If you are 61-7 years of age, have your blood pressure checked every 3-5 years. If you are 45 years of age or older, have your blood pressure checked every year. You should have your blood pressure measured twice-once when you are at a hospital or clinic, and once when you are not at a hospital or clinic. Record the average of the two measurements. To check your blood pressure when you  are not at a hospital or clinic, you can use: ? An automated blood pressure machine at a pharmacy. ? A home blood pressure monitor.  Talk to your health care provider about your target blood pressure.  If you are between 33-38 years old, ask your health care provider if you should take aspirin to prevent heart disease.  Have regular diabetes screenings by checking your fasting blood sugar level. ? If you are at a normal weight and have a low risk for diabetes, have this test once every three years after the age of 22. ? If you are overweight and have a high risk for diabetes, consider being tested at a younger age or more often.  A one-time screening for abdominal aortic aneurysm (AAA) by ultrasound is recommended for men aged 70-75 years who are current or former smokers. What should I know about preventing infection? Hepatitis B If you have a higher risk for hepatitis B, you should be screened for this virus. Talk with your health care provider to find out if you are at risk for hepatitis B infection. Hepatitis C Blood testing is recommended for:  Everyone born from [birth year] through 1967.  Anyone with known risk factors for hepatitis C.  Sexually Transmitted Diseases (STDs)  You should be screened each year for STDs including gonorrhea and chlamydia if: ? You are sexually active and are younger than 79 years of age. ? You are older than 79 years of age and your health care provider tells you that you are at risk for this type of infection. ? Your sexual activity has changed since you were last screened and you are at an increased risk for chlamydia or gonorrhea. Ask your health care provider if you are at risk.  Talk with your health care provider about whether you are at high risk of being infected with HIV. Your health care provider may recommend a prescription medicine to help prevent HIV infection.  What else can I do?  Schedule regular health, dental, and eye exams.  Stay  current with your vaccines (immunizations).  Do not use any tobacco products, such as cigarettes, chewing tobacco, and e-cigarettes. If you need help quitting, ask your health care provider.  Limit alcohol intake to no more than 2 drinks per day. One drink equals 12 ounces of beer, 5 ounces of wine, or 1 ounces of hard liquor.  Do not use street drugs.  Do  not share needles.  Ask your health care provider for help if you need support or information about quitting drugs.  Tell your health care provider if you often feel depressed.  Tell your health care provider if you have ever been abused or do not feel safe at home. This information is not intended to replace advice given to you by your health care provider. Make sure you discuss any questions you have with your health care provider. Document Released: 03/18/2008 Document Revised: 05/19/2016 Document Reviewed: 06/24/2015 Elsevier Interactive Patient Education  [employer].

## 2018-09-04 NOTE — Progress Notes (Addendum)
Subjective:   Todd Watson is a 77 y.o. male who presents for Medicare Annual/Subsequent preventive examination.  Reports health as fair; good aside from back pain    Diet A1c 6.8- will be retested soon [employer]; Oatmeal;   Danny[employer] for breakfast  [employer] green beans. 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 HEMOGLOBIN A1C  10/13/2017   Recent cut to left index finger from scrapping a metal barrel outside but not sure if he had tetanus at the [place]. He will check next week   Will check as well on the PSV 23 most likely given by the [place] Exam is scheduled at the [facility] No issues to date  Cardiac Risk Factors include: advanced age (>9men, >59 women);diabetes mellitus;dyslipidemia;family history of premature cardiovascular disease;male gender  Had his shingrix vaccines and did well      Objective:    Vitals: BP 138/60   Pulse (!) 46   Ht 5\' 7"  (1.702 m)   Wt 164 lb (74.4 kg)   SpO2 93%   BMI 25.69 kg/m   Body mass index is 25.69 kg/m.  Advanced Directives 09/04/2018 08/09/2017  Does Patient Have a Medical Advance Directive? No No  Would patient like information on creating a medical advance directive? - Yes (MAU/Ambulatory/Procedural Areas - Information given)    Tobacco Social History   Tobacco Use  Smoking Status Never Smoker  Smokeless Tobacco Never Used     Counseling given: Yes   Clinical Intake:      Past Medical History:  Diagnosis Date  . Arthritis   . Diabetes  type 2, controlled ([place])   . Diverticulitis   . GERD (gastroesophageal reflux disease)   . Herpes zoster   . Hiatal hernia   . History of kidney stones   . Hypothyroidism   . Osteoarthritis of lumbar spine    Past Surgical History:  Procedure Laterality Date  . APPENDECTOMY  2000  . INGUINAL HERNIA REPAIR Bilateral   . SPINE SURGERY     spurs   Family History  Problem Relation Age of Onset  . Heart disease Father        Valve disease  . Kidney failure Mother    Social History   Socioeconomic History  . Marital status: Married    Spouse name: Not on file  . Number of children: 3  . Years of education: Not on file  . Highest education level: Not on file  Occupational History  . Not on file  Social Needs  . Financial resource strain: Not on file  . Food insecurity:    Worry: Not on file    Inability: Not on file  . Transportation needs:    Medical: Not on file    Non-medical: Not on file  Tobacco Use  . Smoking status: Never Smoker  . Smokeless tobacco: Never Used  Substance and Sexual Activity  . Alcohol use: No  . Drug  use: No  . Sexual activity: Not on file  Lifestyle  . Physical activity:    Days per week: Not on file    Minutes per session: Not on file  . Stress: Not on file  Relationships  . Social connections:    Talks on phone: Not on file    Gets together: Not on file    Attends religious service: Not on file    Active member of club or organization: Not on file    Attends meetings of clubs or organizations: Not on file    Relationship status: Not on file  Other Topics Concern  . Not on file  Social History Narrative   Lives with wife.  6 grandchildren.      Outpatient Encounter Medications as of 09/04/2018  Medication Sig  . aspirin 81 MG tablet Take 81 mg by mouth daily.  Marland Kitchen atorvastatin (LIPITOR) 20 MG tablet Take 20 mg by mouth daily.  Marland Kitchen glucosamine-chondroitin 500-400 MG tablet Take 1 tablet by mouth daily.  Marland Kitchen levothyroxine (SYNTHROID,  LEVOTHROID) 125 MCG tablet Take 1 tablet (125 mcg total) by mouth daily before breakfast.  . lisinopril (PRINIVIL,ZESTRIL) 10 MG tablet Take 5 mg by mouth 2 (two) times daily.   . Multiple Vitamin (MULTIVITAMIN) tablet Take 1 tablet by mouth daily.  . naproxen sodium (ANAPROX) 220 MG tablet Take 220 mg by mouth as needed.  . Omega-3 Fatty Acids (FISH OIL) 1200 MG CAPS Take by mouth.  Marland Kitchen omeprazole (PRILOSEC) 40 MG capsule Take 40 mg by mouth daily.  . Probiotic Product (PROBIOTIC DAILY PO) Take by mouth.  . sildenafil (VIAGRA) 100 MG tablet Take by mouth.  . vitamin C (ASCORBIC ACID) 500 MG tablet Take 500 mg by mouth daily.   No facility-administered encounter medications on file as of 09/04/2018.     Activities of Daily Living In your present state of health, do you have any difficulty performing the following activities: 09/04/2018  Hearing? N  Vision? N  Difficulty concentrating or making decisions? N  Walking or climbing stairs? N  Dressing or bathing? N  Doing errands, shopping? N  Preparing Food and eating ? N  Using the Toilet? N  Comment he is followed by Urology   In the past six months, have you accidently leaked urine? Y  Comment up at hs x 3 times   Do you have problems with loss of bowel control? N  Managing your Medications? N  Managing your Finances? N  Housekeeping or managing your Housekeeping? N  Some recent data might be hidden    Patient Care Team: [name], NP as PCP - General (Family Medicine)   Assessment:   This is a routine wellness examination for Todd Watson.  Exercise Activities and Dietary recommendations Current Exercise Habits: Home exercise routine(likes to live life; stays busy )  Goals    . Exercise 150 minutes per week (moderate activity)     Will start swimming !    . Patient Stated     Keep wife happy and stay healthy and active        Fall Risk Fall Risk  09/04/2018 08/09/2017 04/12/2017 07/31/2015 07/23/2014  Falls in the past  year? 0 No No No No     Depression Screen PHQ 2/9 Scores 09/04/2018 08/09/2017 04/12/2017 07/31/2015  PHQ - 2 Score 0 0 0 0    Cognitive Function MMSE - Mini Mental State Exam 09/04/2018  Not completed: (No Data)     Ad8 score reviewed  for issues:  Issues making decisions:  Less interest in hobbies / activities:  Repeats questions, stories (family complaining):  Trouble using ordinary gadgets (microwave, computer, phone):  Forgets the month or year:   Mismanaging finances:   Remembering appts:  Daily problems with thinking and/or memory: Ad8 score is=0       Immunization History  Administered Date(s) Administered  . Influenza, High Dose Seasonal PF 07/31/2015, 07/04/2017  . Influenza,inj,Quad PF,6+ Mos 07/04/2014  . Influenza-Unspecified 08/21/2018  . Pneumococcal Conjugate-13 07/04/2014  . Zoster Recombinat (Shingrix) 04/03/2017, 07/04/2017      Screening Tests Health Maintenance  Topic Date Due  . HEMOGLOBIN A1C  10/13/2017  . OPHTHALMOLOGY EXAM  09/12/2018 (Originally 09/04/2015)  . TETANUS/TDAP  09/05/2019 (Originally 11/11/1959)  . PNA vac Low Risk Adult (2 of 2 - PPSV23) 09/05/2019 (Originally 07/05/2015)  . FOOT EXAM  01/03/2019  . INFLUENZA VACCINE  Completed         Plan:      PCP Notes   Health Maintenance Recent cut to left index finger from scrapping a metal barrel outside but not sure if he had tetanus at the [place]. He will check next week   Will check as well on the PSV 23 most likely given by the [place] Exam is scheduled at the [facility] soon No issues to date     Had his shingrix vaccines and did well    Abnormal Screens  None  Referrals  Going to the [place] for meds and hearing/ eye exam as well as preventive health   Patient concerns; States his back is hurting  OA of the spine -  Goes to [employer] in the summer for 2 weeks and then home for 2 weeks Did not get injection but monitoring the use of his back  Now he can't sit very  long and hurts when he gets up  [employer] on 12/26  But advised him to see Tommi Rumps sooner evaluation of back  Loves to go to [employer] and fish and camp   Nurse Concerns; As noted  Very happy and active patient   Next PCP apt 12/5 to review for new back issues 12/26 for CPE      I have personally reviewed and noted the following in the patient's chart:   . Medical and social history . Use of alcohol, tobacco or illicit drugs  . Current medications and supplements . Functional ability and status . Nutritional status . Physical activity . Advanced directives . List of other physicians . Hospitalizations, surgeries, and ER visits in previous 12 months . Vitals . Screenings to include cognitive, depression, and falls . Referrals and appointments  In addition, I have reviewed and discussed with patient certain preventive protocols, quality metrics, and best practice recommendations. A written personalized care plan for preventive services as well as general preventive health recommendations were provided to patient.     TJQZE,SPQZR, RN  09/04/2018  I have reviewed the documentation for the [facility] and [place] provided by the health coach and agree with their documentation. I was immediately available for any questions  Eulas Post MD [facility]

## 2018-09-06 DIAGNOSIS — N401 Enlarged prostate with lower urinary tract symptoms: Secondary | ICD-10-CM | POA: Diagnosis not present

## 2018-09-07 ENCOUNTER — Encounter: Payer: Self-pay | Admitting: Adult Health

## 2018-09-07 ENCOUNTER — Ambulatory Visit (INDEPENDENT_AMBULATORY_CARE_PROVIDER_SITE_OTHER): Payer: PPO | Admitting: Adult Health

## 2018-09-07 VITALS — BP 136/72 | Temp 97.8°F | Wt 164.0 lb

## 2018-09-07 DIAGNOSIS — M1612 Unilateral primary osteoarthritis, left hip: Secondary | ICD-10-CM | POA: Diagnosis not present

## 2018-09-07 DIAGNOSIS — M47816 Spondylosis without myelopathy or radiculopathy, lumbar region: Secondary | ICD-10-CM | POA: Diagnosis not present

## 2018-09-07 NOTE — Progress Notes (Signed)
Subjective:    Patient ID: Todd Watson, male    DOB: 01/15/1941, 77 y.o.   MRN: [id number]  HPI 77 year old male who  has a past medical history of Arthritis, Diabetes type 2, controlled ([place]), Diverticulitis, GERD (gastroesophageal reflux disease), Herpes zoster, Hiatal hernia, History of kidney stones, Hypothyroidism, and Osteoarthritis of lumbar spine.   Resents to the office today with a complaint of chronic low back and left knee pain.  Pain has been present for multiple years at this point.  Feels stiff and pain mostly in the morning and when he gets up and starts becoming active pain usually resolves.  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  Does report that he will use Aleve on occasion and this "makes me feel like a teenager".  He does not like taking medications on a continuous basis so he does not use Aleve all the time.  Most recent x-ray of lumbar spine in 2017 degenerative changes and going as far back as 2010 with some form of arthritis or bulging disc issue.  We talked about getting x-rays today and he would prefer to hold off at this point in time.  Review of Systems See HPI   Past Medical History:  Diagnosis Date  . Arthritis   . Diabetes type 2, controlled ([place])   . Diverticulitis   . GERD (gastroesophageal reflux disease)   . Herpes zoster   . Hiatal hernia   . History of kidney stones   . Hypothyroidism   . Osteoarthritis of lumbar spine     Social History   Socioeconomic History  . Marital status: Married    Spouse name: Not on file  . Number of children: 3  . Years of education: Not on file  . Highest education level: Not on file  Occupational History  . Not on file  Social Needs  . Financial resource strain: Not on file  . Food insecurity:    Worry: Not on file    Inability: Not on file  . Transportation needs:    Medical: Not on file    Non-medical: Not on file  Tobacco Use  . Smoking status: Never Smoker  . Smokeless  tobacco: Never Used  Substance and Sexual Activity  . Alcohol use: No  . Drug use: No  . Sexual activity: Not on file  Lifestyle  . Physical activity:    Days per week: Not on file    Minutes per session: Not on file  . Stress: Not on file  Relationships  . Social connections:    Talks on phone: Not on file    Gets together: Not on file    Attends religious service: Not on file    Active member of club or organization: Not on file    Attends meetings of clubs or organizations: Not on file    Relationship status: Not on file  . Intimate partner violence:    Fear of current or ex partner: Not on file    Emotionally abused: Not on file    Physically abused: Not on file    Forced sexual activity: Not on file  Other Topics Concern  . Not on file  Social History Narrative   Lives with wife.  6 grandchildren.      Past Surgical History:  Procedure Laterality Date  . APPENDECTOMY  2000  . INGUINAL HERNIA REPAIR Bilateral   . SPINE SURGERY     spurs    Family  History  Problem Relation Age of Onset  . Heart disease Father        Valve disease  . Kidney failure Mother     No Known Allergies  Current Outpatient Medications on File Prior to Visit  Medication Sig Dispense Refill  . aspirin 81 MG tablet Take 81 mg by mouth daily.    Marland Kitchen atorvastatin (LIPITOR) 20 MG tablet Take 20 mg by mouth daily.    Marland Kitchen glucosamine-chondroitin 500-400 MG tablet Take 1 tablet by mouth daily.    Marland Kitchen levothyroxine (SYNTHROID, LEVOTHROID) 125 MCG tablet Take 1 tablet (125 mcg total) by mouth daily before breakfast. 90 tablet 3  . lisinopril (PRINIVIL,ZESTRIL) 10 MG tablet Take 5 mg by mouth 2 (two) times daily.     . Multiple Vitamin (MULTIVITAMIN) tablet Take 1 tablet by mouth daily.    . naproxen sodium (ANAPROX) 220 MG tablet Take 220 mg by mouth as needed.    . Omega-3 Fatty Acids (FISH OIL) 1200 MG CAPS Take by mouth.    Marland Kitchen omeprazole (PRILOSEC) 40 MG capsule Take 40 mg by mouth daily.    .  Probiotic Product (PROBIOTIC DAILY PO) Take by mouth.    . sildenafil (VIAGRA) 100 MG tablet Take by mouth.    . vitamin C (ASCORBIC ACID) 500 MG tablet Take 500 mg by mouth daily.     No current facility-administered medications on file prior to visit.     BP 136/72   Temp 97.8 F (36.6 C)   Wt 164 lb (74.4 kg)   BMI 25.69 kg/m       Objective:   Physical Exam  Constitutional: He is oriented to person, place, and time. He appears well-developed and well-nourished. No distress.  Cardiovascular: Normal rate, regular rhythm, normal heart sounds and intact distal pulses.  Pulmonary/Chest: Effort normal and breath sounds normal.  Musculoskeletal: Normal range of motion. He exhibits no edema, tenderness or deformity.  Neurological: He is alert and oriented to person, place, and time.  Skin: Skin is warm and dry. Capillary refill takes less than 2 seconds. He is not diaphoretic.  Psychiatric: He has a normal mood and affect. His behavior is normal. Judgment and thought content normal.  Nursing note and vitals reviewed.     Assessment & Plan:  [facility] x-rays at this time.  He was advised that is okay to take Aleve as needed for osteoarthritic pain.  He will be going to [place] at the end of this month which will likely help with his pain as well.  He was advised against heavy lifting.  - Follow up as needed  [name], NP

## 2018-09-13 DIAGNOSIS — N5201 Erectile dysfunction due to arterial insufficiency: Secondary | ICD-10-CM | POA: Diagnosis not present

## 2018-09-13 DIAGNOSIS — R351 Nocturia: Secondary | ICD-10-CM | POA: Diagnosis not present

## 2018-09-13 DIAGNOSIS — Z125 Encounter for screening for malignant neoplasm of prostate: Secondary | ICD-10-CM | POA: Diagnosis not present

## 2018-09-13 DIAGNOSIS — N401 Enlarged prostate with lower urinary tract symptoms: Secondary | ICD-10-CM | POA: Diagnosis not present

## 2018-09-28 ENCOUNTER — Encounter: Payer: Self-pay | Admitting: Adult Health

## 2018-09-28 ENCOUNTER — Ambulatory Visit (INDEPENDENT_AMBULATORY_CARE_PROVIDER_SITE_OTHER): Payer: PPO | Admitting: Adult Health

## 2018-09-28 ENCOUNTER — Other Ambulatory Visit: Payer: Self-pay | Admitting: Adult Health

## 2018-09-28 VITALS — BP 124/60 | HR 50 | Temp 97.6°F | Ht 67.0 in | Wt 162.6 lb

## 2018-09-28 DIAGNOSIS — I10 Essential (primary) hypertension: Secondary | ICD-10-CM

## 2018-09-28 DIAGNOSIS — E039 Hypothyroidism, unspecified: Secondary | ICD-10-CM | POA: Diagnosis not present

## 2018-09-28 DIAGNOSIS — Z0000 Encounter for general adult medical examination without abnormal findings: Secondary | ICD-10-CM

## 2018-09-28 DIAGNOSIS — E1169 Type 2 diabetes mellitus with other specified complication: Secondary | ICD-10-CM | POA: Diagnosis not present

## 2018-09-28 LAB — COMPREHENSIVE METABOLIC PANEL
ALT: 28 U/L (ref 0–53)
AST: 27 U/L (ref 0–37)
Albumin: 4.6 g/dL (ref 3.5–5.2)
Alkaline Phosphatase: 50 U/L (ref 39–117)
BUN: 15 mg/dL (ref 6–23)
CO2: 27 mEq/L (ref 19–32)
Calcium: 9.3 mg/dL (ref 8.4–10.5)
Chloride: 103 meq/L (ref 96–112)
Creatinine, Ser: 0.85 mg/dL (ref 0.40–1.50)
GFR: 92.21 mL/min (ref >60.00)
Glucose, Bld: 127 mg/dL — ABNORMAL HIGH (ref 70–99)
Potassium: 4.4 meq/L (ref 3.5–5.1)
Sodium: 138 meq/L (ref 135–145)
Total Bilirubin: 0.7 mg/dL (ref 0.2–1.2)
Total Protein: 6.9 g/dL (ref 6.0–8.3)

## 2018-09-28 LAB — LIPID PANEL
Cholesterol: 160 mg/dL (ref 0–200)
HDL: 41.1 mg/dL (ref >39.00)
LDL Cholesterol: 92 mg/dL (ref 0–99)
NonHDL: 118.91
Total CHOL/HDL Ratio: 4
Triglycerides: 133 mg/dL (ref 0.0–149.0)
VLDL: 26.6 mg/dL (ref 0.0–40.0)

## 2018-09-28 LAB — CBC WITH DIFFERENTIAL/PLATELET
Basophils Absolute: 0 10*3/uL (ref 0.0–0.1)
Basophils Relative: 0.4 % (ref 0.0–3.0)
Eosinophils Absolute: 0.1 10*3/uL (ref 0.0–0.7)
Eosinophils Relative: 2.5 % (ref 0.0–5.0)
HCT: 42.7 % (ref 39.0–52.0)
Hemoglobin: 14.7 g/dL (ref 13.0–17.0)
Lymphocytes Relative: 27.6 % (ref 12.0–46.0)
Lymphs Abs: 1.3 10*3/uL (ref 0.7–4.0)
MCHC: 34.3 g/dL (ref 30.0–36.0)
MCV: 93.8 fL (ref 78.0–100.0)
Monocytes Absolute: 0.5 10*3/uL (ref 0.1–1.0)
Monocytes Relative: 9.9 % (ref 3.0–12.0)
Neutro Abs: 2.8 10*3/uL (ref 1.4–7.7)
Neutrophils Relative %: 59.6 % (ref 43.0–77.0)
Platelets: 163 10*3/uL (ref 150.0–400.0)
RBC: 4.56 Mil/uL (ref 4.22–5.81)
RDW: 13.6 % (ref 11.5–15.5)
WBC: 4.6 10*3/uL (ref 4.0–10.5)

## 2018-09-28 LAB — TSH: TSH: 2.3 u[IU]/mL (ref 0.35–4.50)

## 2018-09-28 LAB — HEMOGLOBIN A1C: Hgb A1c MFr Bld: 6.6 % — ABNORMAL HIGH (ref 4.6–6.5)

## 2018-09-28 NOTE — Progress Notes (Signed)
Subjective:    Patient ID: Todd Watson, male    DOB: 03-03-1941, 77 y.o.   MRN: [id number]  HPI Patient presents for yearly preventative medicine examination. He is a pleasant 77 year old male who  has a past medical history of Arthritis, Diabetes type 2, controlled ([place]), Diverticulitis, GERD (gastroesophageal reflux disease), Herpes zoster, Hiatal hernia, History of kidney stones, Hypothyroidism, and Osteoarthritis of lumbar spine.   He continues to get most of his care at the [place] in [place].   Hyperlipidemia - takes Lipitor 20 mg daily  Lab Results  Component Value Date   CHOL 154 04/12/2017   HDL 44.00 04/12/2017   LDLCALC 86 04/12/2017   TRIG 120.0 04/12/2017   CHOLHDL 3 04/12/2017   Hypertension - Controlled with lisinopril 10 mg daily  BP Readings from Last 3 Encounters:  09/28/18 124/60  09/07/18 136/72  09/04/18 138/60   Hyperthyroidism - takes Synthroid 125 mcg daily Lab Results  Component Value Date   TSH 1.00 04/12/2017    DM - diet controlled.  Lab Results  Component Value Date   HGBA1C 6.8 (H) 04/12/2017    All immunizations and health maintenance protocols were reviewed with the patient and needed orders were placed. utd   Appropriate screening laboratory values were ordered for the patient including screening of hyperlipidemia, renal function and hepatic function.  Medication reconciliation,  past medical history, social history, problem list and allergies were reviewed in detail with the patient  Goals were established with regard to weight loss, exercise, and  diet in compliance with medications. 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 He has had to slow down over the last year d/t chronic back pain.   End of life planning was discussed. He has an advanced directive and living will.   He has no acute issues.   Review of Systems  Constitutional: Negative.   HENT: Positive for hearing loss.   Eyes: Negative.     Respiratory: Negative.   Cardiovascular: Negative.   Gastrointestinal: Negative.   Endocrine: Negative.   Genitourinary: Negative.   Musculoskeletal: Positive for arthralgias and back pain.  Skin: Negative.   Allergic/Immunologic: Negative.   Neurological: Negative.   Hematological: Negative.   Psychiatric/Behavioral: Negative.   All other systems reviewed and are negative.  Past Medical History:  Diagnosis Date  . Arthritis   . Diabetes type 2, controlled ([place])   . Diverticulitis   . GERD (gastroesophageal reflux disease)   . Herpes zoster   . Hiatal hernia   . History of kidney stones   . Hypothyroidism   . Osteoarthritis of lumbar spine     Social History   Socioeconomic History  . Marital status: Married    Spouse name: Not on file  . Number of children: 3  . Years of education: Not on file  . Highest education level: Not on file  Occupational History  . Not on file  Social Needs  . Financial resource strain: Not on file  . Food insecurity:    Worry: Not on file    Inability: Not on file  . Transportation needs:    Medical: Not on file    Non-medical: Not on file  Tobacco Use  . Smoking status: Never Smoker  . Smokeless tobacco: Never Used  Substance and Sexual Activity  . Alcohol use: No  . Drug use: No  . Sexual activity: Not on file  Lifestyle  . Physical activity:    Days  per week: Not on file    Minutes per session: Not on file  . Stress: Not on file  Relationships  . Social connections:    Talks on phone: Not on file    Gets together: Not on file    Attends religious service: Not on file    Active member of club or organization: Not on file    Attends meetings of clubs or organizations: Not on file    Relationship status: Not on file  . Intimate partner violence:    Fear of current or ex partner: Not on file    Emotionally abused: Not on file    Physically abused: Not on file    Forced sexual activity: Not on file  Other Topics Concern   . Not on file  Social History Narrative   Lives with wife.  6 grandchildren.      Past Surgical History:  Procedure Laterality Date  . APPENDECTOMY  2000  . INGUINAL HERNIA REPAIR Bilateral   . SPINE SURGERY     spurs    Family History  Problem Relation Age of Onset  . Heart disease Father        Valve disease  . Kidney failure Mother     No Known Allergies  Current Outpatient Medications on File Prior to Visit  Medication Sig Dispense Refill  . aspirin 81 MG tablet Take 81 mg by mouth daily.    Marland Kitchen atorvastatin (LIPITOR) 20 MG tablet Take 20 mg by mouth daily.    Marland Kitchen glucosamine-chondroitin 500-400 MG tablet Take 1 tablet by mouth daily.    Marland Kitchen levothyroxine (SYNTHROID, LEVOTHROID) 125 MCG tablet Take 1 tablet (125 mcg total) by mouth daily before breakfast. 90 tablet 3  . lisinopril (PRINIVIL,ZESTRIL) 10 MG tablet Take 5 mg by mouth 2 (two) times daily.     . Multiple Vitamin (MULTIVITAMIN) tablet Take 1 tablet by mouth daily.    . naproxen sodium (ANAPROX) 220 MG tablet Take 220 mg by mouth as needed.    . Omega-3 Fatty Acids (FISH OIL) 1200 MG CAPS Take by mouth.    Marland Kitchen omeprazole (PRILOSEC) 40 MG capsule Take 40 mg by mouth daily.    . Probiotic Product (PROBIOTIC DAILY PO) Take by mouth.    . sildenafil (VIAGRA) 100 MG tablet Take by mouth.    . vitamin C (ASCORBIC ACID) 500 MG tablet Take 500 mg by mouth daily.     No current facility-administered medications on file prior to visit.     BP 124/60 (BP Location: Left Arm, Patient Position: Sitting, Cuff Size: Normal)   Pulse (!) 50   Temp 97.6 F (36.4 C) (Oral)   Ht 5\' 7"  (1.702 m)   Wt 162 lb 9.6 oz (73.8 kg)   SpO2 95%   BMI 25.47 kg/m       Objective:   Physical Exam Vitals signs and nursing note reviewed.  Constitutional:      General: He is not in acute distress.    Appearance: Normal appearance. He is well-developed and normal weight. He is not diaphoretic.  HENT:     Head: Normocephalic and  atraumatic.     Right Ear: Tympanic membrane, ear canal and external ear normal. There is no impacted cerumen.     Left Ear: Tympanic membrane, ear canal and external ear normal. There is no impacted cerumen.     Nose: Nose normal. No congestion or rhinorrhea.     Mouth/Throat:     Mouth:  Mucous membranes are moist.     Pharynx: Oropharynx is clear. No oropharyngeal exudate.  Eyes:     General:        Right eye: No discharge.        Left eye: No discharge.     Conjunctiva/sclera: Conjunctivae normal.     Pupils: Pupils are equal, round, and reactive to light.  Neck:     Musculoskeletal: Normal range of motion and neck supple. No neck rigidity or muscular tenderness.     Thyroid: No thyromegaly.     Vascular: No carotid bruit.     Trachea: No tracheal deviation.  Cardiovascular:     Rate and Rhythm: Normal rate and regular rhythm.     Pulses: Normal pulses.     Heart sounds: Normal heart sounds. No murmur. No friction rub. No gallop.   Pulmonary:     Effort: Pulmonary effort is normal. No respiratory distress.     Breath sounds: Normal breath sounds. No stridor. No wheezing, rhonchi or rales.  Chest:     Chest wall: No tenderness.  Abdominal:     General: Bowel sounds are normal. There is no distension.     Palpations: Abdomen is soft. There is no mass.     Tenderness: There is no abdominal tenderness. There is no right CVA tenderness, left CVA tenderness, guarding or rebound.     Hernia: No hernia is present.  Musculoskeletal: Normal range of motion.  Lymphadenopathy:     Cervical: No cervical adenopathy.  Skin:    General: Skin is warm and dry.     Capillary Refill: Capillary refill takes less than 2 seconds.     Coloration: Skin is not jaundiced or pale.     Findings: No bruising, erythema, lesion or rash.  Neurological:     General: No focal deficit present.     Mental Status: He is alert and oriented to person, place, and time. Mental status is at baseline.     Cranial  Nerves: No cranial nerve deficit.     Coordination: Coordination normal.  Psychiatric:        Mood and Affect: Mood normal.        Behavior: Behavior normal.        Thought Content: Thought content normal.        Judgment: Judgment normal.       Assessment & Plan:  1. Routine general medical examination at a health care facility - Follow up in one year or sooner if needed - CBC with Differential/Platelet - Comprehensive metabolic panel - Hemoglobin A1c - Lipid panel - TSH  2. Type 2 diabetes mellitus with other specified complication, without long-term current use of insulin ([place]) - Consider adding metformin  - CBC with Differential/Platelet - Comprehensive metabolic panel - Hemoglobin A1c - Lipid panel - TSH  3. Essential hypertension, benign - Well controlled. No change in medications  - CBC with Differential/Platelet - Comprehensive metabolic panel - Hemoglobin A1c - Lipid panel - TSH  4. Hypothyroidism, unspecified type - Consider dose change of synthroid  - CBC with Differential/Platelet - Comprehensive metabolic panel - Hemoglobin A1c - Lipid panel - TSH   [name], NP

## 2018-11-03 DIAGNOSIS — E7800 Pure hypercholesterolemia, unspecified: Secondary | ICD-10-CM | POA: Diagnosis not present

## 2018-11-03 DIAGNOSIS — Z7982 Long term (current) use of aspirin: Secondary | ICD-10-CM | POA: Diagnosis not present

## 2018-11-03 DIAGNOSIS — R252 Cramp and spasm: Secondary | ICD-10-CM | POA: Diagnosis not present

## 2018-11-03 DIAGNOSIS — M79604 Pain in right leg: Secondary | ICD-10-CM | POA: Diagnosis not present

## 2018-11-03 DIAGNOSIS — Z79899 Other long term (current) drug therapy: Secondary | ICD-10-CM | POA: Diagnosis not present

## 2018-11-03 DIAGNOSIS — I10 Essential (primary) hypertension: Secondary | ICD-10-CM | POA: Diagnosis not present

## 2019-01-10 ENCOUNTER — Encounter: Payer: Self-pay | Admitting: Adult Health

## 2019-01-10 ENCOUNTER — Ambulatory Visit (INDEPENDENT_AMBULATORY_CARE_PROVIDER_SITE_OTHER): Payer: Medicare HMO | Admitting: Adult Health

## 2019-01-10 ENCOUNTER — Other Ambulatory Visit: Payer: Self-pay

## 2019-01-10 DIAGNOSIS — M7022 Olecranon bursitis, left elbow: Secondary | ICD-10-CM

## 2019-01-10 NOTE — Progress Notes (Signed)
Virtual Visit via Video Note  I connected with [employer] on 01/10/19 at  8:30 AM EDT by a video enabled telemedicine application and verified that I am speaking with the correct person using two identifiers.  Location patient: home Location provider:work or home office Persons participating in the virtual visit: patient, provider  I discussed the limitations of evaluation and management by telemedicine and the availability of in person appointments. The patient expressed understanding and agreed to proceed.   HPI: 78 year old male who is being evaluated today for swelling along left elbow x1 week.  Reports no trauma to the left elbow, has been outside wood.  He reports golf ball size mass on his left elbow.  Denies redness, warmth, or pain.  Has no loss of range of motion   ROS: See pertinent positives and negatives per HPI.  Past Medical History:  Diagnosis Date  . Arthritis   . Diabetes type 2, controlled ([place])   . Diverticulitis   . GERD (gastroesophageal reflux disease)   . Herpes zoster   . Hiatal hernia   . History of kidney stones   . Hypothyroidism   . Osteoarthritis of lumbar spine     Past Surgical History:  Procedure Laterality Date  . APPENDECTOMY  2000  . INGUINAL HERNIA REPAIR Bilateral   . SPINE SURGERY     spurs    Family History  Problem Relation Age of Onset  . Heart disease Father        Valve disease  . Kidney failure Mother       Current Outpatient Medications:  .  aspirin 81 MG tablet, Take 81 mg by mouth daily., Disp: , Rfl:  .  atorvastatin (LIPITOR) 20 MG tablet, Take 20 mg by mouth daily., Disp: , Rfl:  .  glucosamine-chondroitin 500-400 MG tablet, Take 1 tablet by mouth daily., Disp: , Rfl:  .  levothyroxine (SYNTHROID, LEVOTHROID) 125 MCG tablet, Take 1 tablet (125 mcg total) by mouth daily before breakfast., Disp: 90 tablet, Rfl: 3 .  lisinopril (PRINIVIL,ZESTRIL) 10 MG tablet, Take 5 mg by mouth 2 (two) times daily. , Disp: , Rfl:   .  Multiple Vitamin (MULTIVITAMIN) tablet, Take 1 tablet by mouth daily., Disp: , Rfl:  .  naproxen sodium (ANAPROX) 220 MG tablet, Take 220 mg by mouth as needed., Disp: , Rfl:  .  Omega-3 Fatty Acids (FISH OIL) 1200 MG CAPS, Take by mouth., Disp: , Rfl:  .  omeprazole (PRILOSEC) 40 MG capsule, Take 40 mg by mouth daily., Disp: , Rfl:  .  Probiotic Product (PROBIOTIC DAILY PO), Take by mouth., Disp: , Rfl:  .  sildenafil (VIAGRA) 100 MG tablet, Take by mouth., Disp: , Rfl:  .  vitamin C (ASCORBIC ACID) 500 MG tablet, Take 500 mg by mouth daily., Disp: , Rfl:   EXAM:  VITALS per patient if applicable:  GENERAL: alert, oriented, appears well and in no acute distress  HEENT: atraumatic, conjunttiva clear, no obvious abnormalities on inspection of external nose and ears  NECK: normal movements of the head and neck  LUNGS: on inspection no signs of respiratory distress, breathing rate appears normal, no obvious gross SOB, gasping or wheezing  CV: no obvious cyanosis  MS: moves all visible extremities without noticeable abnormality. + Olecranon bursitis to left elbow. No erythema noted.   PSYCH/NEURO: pleasant and cooperative, no obvious depression or anxiety, speech and thought processing grossly intact  ASSESSMENT AND PLAN:  Discussed the following assessment and plan:  Olecranon  bursitis of left elbow  Does not appear to be any infection at this time.  We will keep him at home due to COVID-19 pandemic.  I do not see a reason to drain the bursa.  Advised to wear compression such as Ace wrap around the left elbow.  If redness or warmth appears then follow-up in the office for drainage.   I discussed the assessment and treatment plan with the patient. The patient was provided an opportunity to ask questions and all were answered. The patient agreed with the plan and demonstrated an understanding of the instructions.   The patient was advised to call back or seek an in-person  evaluation if the symptoms worsen or if the condition fails to improve as anticipated.   [name], NP

## 2019-01-17 ENCOUNTER — Ambulatory Visit (INDEPENDENT_AMBULATORY_CARE_PROVIDER_SITE_OTHER): Payer: Medicare HMO | Admitting: Adult Health

## 2019-01-17 ENCOUNTER — Encounter: Payer: Self-pay | Admitting: Adult Health

## 2019-01-17 ENCOUNTER — Other Ambulatory Visit: Payer: Self-pay

## 2019-01-17 VITALS — BP 136/90 | Temp 97.5°F | Wt 163.0 lb

## 2019-01-17 DIAGNOSIS — M7022 Olecranon bursitis, left elbow: Secondary | ICD-10-CM | POA: Diagnosis not present

## 2019-01-17 NOTE — Progress Notes (Signed)
Subjective:    Patient ID: Todd Watson, male    DOB: [birth year], 78 y.o.   MRN: [id number]  HPI  78 year old male who  has a past medical history of Arthritis, Diabetes type 2, controlled ([place]), Diverticulitis, GERD (gastroesophageal reflux disease), Herpes zoster, Hiatal hernia, History of kidney stones, Hypothyroidism, and Osteoarthritis of lumbar spine.  Patient presents to the office today for follow-up regarding Olecranon bursitis of left elbow.  He was originally evaluated video visit last week for this issue.  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  At this time he did not endorse any redness, warmth, or pain.  He also had normal range of motion.  He was advised to wrap an Ace bandage around the area if this helped resolve the swelling.  He reports that he did this but there have been no improvement.  Today he is in for drainage of the bursa.   Review of Systems See HPI   Past Medical History:  Diagnosis Date  . Arthritis   . Diabetes type 2, controlled ([place])   . Diverticulitis   . GERD (gastroesophageal reflux disease)   . Herpes zoster   . Hiatal hernia   . History of kidney stones   . Hypothyroidism   . Osteoarthritis of lumbar spine     Social History   Socioeconomic History  . Marital status: Married    Spouse name: Not on file  . Number of children: 3  . Years of education: Not on file  . Highest education level: Not on file  Occupational History  . Not on file  Social Needs  . Financial resource strain: Not on file  . Food insecurity:    Worry: Not on file    Inability: Not on file  . Transportation needs:    Medical: Not on file    Non-medical: Not on file  Tobacco Use  . Smoking status: Never Smoker  . Smokeless tobacco: Never Used  Substance and Sexual Activity  . Alcohol use: No  . Drug use: No  . Sexual activity: Not on file  Lifestyle  . Physical activity:    Days per week: Not on file   Minutes per session: Not on file  . Stress: Not on file  Relationships  . Social connections:    Talks on phone: Not on file    Gets together: Not on file    Attends religious service: Not on file    Active member of club or organization: Not on file    Attends meetings of clubs or organizations: Not on file    Relationship status: Not on file  . Intimate partner violence:    Fear of current or ex partner: Not on file    Emotionally abused: Not on file    Physically abused: Not on file    Forced sexual activity: Not on file  Other Topics Concern  . Not on file  Social History Narrative   Lives with wife.  6 grandchildren.      Past Surgical History:  Procedure Laterality Date  . APPENDECTOMY  2000  . INGUINAL HERNIA REPAIR Bilateral   . SPINE SURGERY     spurs    Family History  Problem Relation Age of Onset  . Heart disease Father        Valve disease  . Kidney failure Mother     No Known Allergies  Current Outpatient Medications  on File Prior to Visit  Medication Sig Dispense Refill  . aspirin 81 MG tablet Take 81 mg by mouth daily.    Marland Kitchen atorvastatin (LIPITOR) 20 MG tablet Take 20 mg by mouth daily.    Marland Kitchen glucosamine-chondroitin 500-400 MG tablet Take 1 tablet by mouth daily.    Marland Kitchen levothyroxine (SYNTHROID, LEVOTHROID) 125 MCG tablet Take 1 tablet (125 mcg total) by mouth daily before breakfast. 90 tablet 3  . lisinopril (PRINIVIL,ZESTRIL) 10 MG tablet Take 5 mg by mouth 2 (two) times daily.     . Multiple Vitamin (MULTIVITAMIN) tablet Take 1 tablet by mouth daily.    . naproxen sodium (ANAPROX) 220 MG tablet Take 220 mg by mouth as needed.    . Omega-3 Fatty Acids (FISH OIL) 1200 MG CAPS Take by mouth.    Marland Kitchen omeprazole (PRILOSEC) 40 MG capsule Take 40 mg by mouth daily.    . Probiotic Product (PROBIOTIC DAILY PO) Take by mouth.    . sildenafil (VIAGRA) 100 MG tablet Take by mouth.    . vitamin C (ASCORBIC ACID) 500 MG tablet Take 500 mg by mouth daily.     No  current facility-administered medications on file prior to visit.     BP 136/90   Temp (!) 97.5 F (36.4 C)   Wt 163 lb (73.9 kg)   BMI 25.53 kg/m       Objective:   Physical Exam Vitals signs and nursing note reviewed.  Constitutional:      Appearance: Normal appearance.  Cardiovascular:     Rate and Rhythm: Normal rate and regular rhythm.     Pulses: Normal pulses.     Heart sounds: Normal heart sounds.  Musculoskeletal:        General: No tenderness.     Comments: Golf ball sized fluid-filled bursa of left elbow.  No redness, warmth, or pain noted.  Skin:    General: Skin is warm and dry.  Neurological:     General: No focal deficit present.     Mental Status: He is alert and oriented to person, place, and time.  Psychiatric:        Mood and Affect: Mood normal.        Behavior: Behavior normal.        Thought Content: Thought content normal.        Judgment: Judgment normal.       Assessment & Plan:  1. Olecranon bursitis of left elbow Procedure:  Drainage of left olecranon bursisits Risks, benefits, and alternatives explained and consent obtained. Time out conducted. Surface cleaned with alcohol and betadine Cold spray used for anesthesia  Adequate anesthesia ensured. Area prepped and draped in a sterile fashion. Using a 16 gauge needle,  68ml of bloody clear fluid was removed  Pt stable. Aftercare and follow-up advised. - Ace bandaged used for compression  - Follow up as needed  - Synovial fluid, crystal  [name], NP

## 2019-01-18 LAB — TIQ-NTM

## 2019-01-18 LAB — SYNOVIAL FLUID, CRYSTAL

## 2019-01-24 ENCOUNTER — Other Ambulatory Visit: Payer: Self-pay

## 2019-01-24 ENCOUNTER — Ambulatory Visit (INDEPENDENT_AMBULATORY_CARE_PROVIDER_SITE_OTHER): Payer: Medicare HMO | Admitting: Adult Health

## 2019-01-24 ENCOUNTER — Encounter: Payer: Self-pay | Admitting: Adult Health

## 2019-01-24 VITALS — BP 116/76 | Temp 97.6°F | Wt 162.0 lb

## 2019-01-24 DIAGNOSIS — M7022 Olecranon bursitis, left elbow: Secondary | ICD-10-CM

## 2019-01-24 MED ORDER — METHYLPREDNISOLONE ACETATE 40 MG/ML IJ SUSP
40.0000 mg | Freq: Once | INTRAMUSCULAR | Status: AC
  Administered 2019-01-24: 15:00:00 40 mg via INTRA_ARTICULAR

## 2019-01-24 NOTE — Progress Notes (Signed)
Subjective:    Patient ID: Abigail Miyamoto, male    DOB: [birth year], 78 y.o.   MRN: [id number]  HPI  78 year old male who  has a past medical history of Arthritis, Diabetes type 2, controlled ([place]), Diverticulitis, GERD (gastroesophageal reflux disease), Herpes zoster, Hiatal hernia, History of kidney stones, Hypothyroidism, and Osteoarthritis of lumbar spine.  Once to the office today for follow-up regarding all her chronic bursitis of left elbow.  He was seen 7 days ago for drainage proximately 10 mils of bloody clear fluid was removed.  He has been wearing his Ace bandage but continues to have swelling of the left elbow.   Review of Systems See HPI   Past Medical History:  Diagnosis Date  . Arthritis   . Diabetes type 2, controlled ([place])   . Diverticulitis   . GERD (gastroesophageal reflux disease)   . Herpes zoster   . Hiatal hernia   . History of kidney stones   . Hypothyroidism   . Osteoarthritis of lumbar spine     Social History   Socioeconomic History  . Marital status: Married    Spouse name: Not on file  . Number of children: 3  . Years of education: Not on file  . Highest education level: Not on file  Occupational History  . Not on file  Social Needs  . Financial resource strain: Not on file  . Food insecurity:    Worry: Not on file    Inability: Not on file  . Transportation needs:    Medical: Not on file    Non-medical: Not on file  Tobacco Use  . Smoking status: Never Smoker  . Smokeless tobacco: Never Used  Substance and Sexual Activity  . Alcohol use: No  . Drug use: No  . Sexual activity: Not on file  Lifestyle  . Physical activity:    Days per week: Not on file    Minutes per session: Not on file  . Stress: Not on file  Relationships  . Social connections:    Talks on phone: Not on file    Gets together: Not on file    Attends religious service: Not on file    Active member of club or organization: Not on file    Attends meetings of  clubs or organizations: Not on file    Relationship status: Not on file  . Intimate partner violence:    Fear of current or ex partner: Not on file    Emotionally abused: Not on file    Physically abused: Not on file    Forced sexual activity: Not on file  Other Topics Concern  . Not on file  Social History Narrative   Lives with wife.  6 grandchildren.      Past Surgical History:  Procedure Laterality Date  . APPENDECTOMY  2000  . INGUINAL HERNIA REPAIR Bilateral   . SPINE SURGERY     spurs    Family History  Problem Relation Age of Onset  . Heart disease Father        Valve disease  . Kidney failure Mother     No Known Allergies  Current Outpatient Medications on File Prior to Visit  Medication Sig Dispense Refill  . aspirin 81 MG tablet Take 81 mg by mouth daily.    Marland Kitchen atorvastatin (LIPITOR) 20 MG tablet Take 20 mg by mouth daily.    Marland Kitchen glucosamine-chondroitin 500-400 MG tablet Take 1 tablet by mouth daily.    Marland Kitchen  levothyroxine (SYNTHROID, LEVOTHROID) 125 MCG tablet Take 1 tablet (125 mcg total) by mouth daily before breakfast. 90 tablet 3  . lisinopril (PRINIVIL,ZESTRIL) 10 MG tablet Take 5 mg by mouth 2 (two) times daily.     . Multiple Vitamin (MULTIVITAMIN) tablet Take 1 tablet by mouth daily.    . naproxen sodium (ANAPROX) 220 MG tablet Take 220 mg by mouth as needed.    . Omega-3 Fatty Acids (FISH OIL) 1200 MG CAPS Take by mouth.    Marland Kitchen omeprazole (PRILOSEC) 40 MG capsule Take 40 mg by mouth daily.    . Probiotic Product (PROBIOTIC DAILY PO) Take by mouth.    . sildenafil (VIAGRA) 100 MG tablet Take by mouth.    . vitamin C (ASCORBIC ACID) 500 MG tablet Take 500 mg by mouth daily.     No current facility-administered medications on file prior to visit.     BP 116/76   Temp 97.6 F (36.4 C)   Wt 162 lb (73.5 kg)   BMI 25.37 kg/m       Objective:   Physical Exam Vitals signs and nursing note reviewed.  Constitutional:      Appearance: Normal appearance.   Cardiovascular:     Rate and Rhythm: Normal rate and regular rhythm.  Musculoskeletal: Normal range of motion.        General: Swelling present.     Comments: Golf ball sized swelling noted on tip of left elbow.  No redness, warmth, or tenderness noted.  Skin:    General: Skin is warm and dry.  Neurological:     General: No focal deficit present.     Mental Status: He is alert and oriented to person, place, and time.  Psychiatric:        Mood and Affect: Mood normal.        Behavior: Behavior normal.        Thought Content: Thought content normal.        Judgment: Judgment normal.       Assessment & Plan:  1. Olecranon bursitis of left elbow Procedure:  Drainage of olecranon bursa with steroid injection Risks, benefits, and alternatives explained and consent obtained. Time out conducted. Surface cleaned with alcohol. Adequate anesthesia ensured cold spray Area prepped and draped in a sterile fashion. Using a 16 gauge needle 10 ml of bloody clear fluid was drained 40 mg of Depo Medrol was then injected into bursa Hemostasis achieved. Pt stable. Aftercare and follow-up advised.  - methylPREDNISolone acetate (DEPO-MEDROL) injection 40 mg  [name], NP

## 2019-02-22 ENCOUNTER — Other Ambulatory Visit: Payer: Self-pay

## 2019-02-22 ENCOUNTER — Encounter: Payer: Self-pay | Admitting: Adult Health

## 2019-02-22 ENCOUNTER — Ambulatory Visit (INDEPENDENT_AMBULATORY_CARE_PROVIDER_SITE_OTHER): Payer: Medicare HMO | Admitting: Adult Health

## 2019-02-22 DIAGNOSIS — M25571 Pain in right ankle and joints of right foot: Secondary | ICD-10-CM | POA: Diagnosis not present

## 2019-02-22 NOTE — Progress Notes (Signed)
Virtual Visit via Telephone Note  I connected with Todd Watson on 02/22/19 at  3:00 PM EDT by telephone and verified that I am speaking with the correct person using two identifiers.   I discussed the limitations, risks, security and privacy concerns of performing an evaluation and management service by telephone and the availability of in person appointments. I also discussed with the patient that there may be a patient responsible charge related to this service. The patient expressed understanding and agreed to proceed.  Location patient: home Location provider: work or home office Participants present for the call: patient, provider Patient did not have a visit in the prior 7 days to address this/these issue(s).   History of Present Illness: 78 year old male who is being evaluated today with the acute complaint of right ankle pain.  Reports that his pain started 1 week ago not noticed any improvement or worsening discomfort.  He denies any known aggravating factors or trauma.  Not noticed any redness, warmth, or swelling to the right ankle.  From the way he describes the location the pain it is the lateral aspect of the talus bone.  Pain is only present when he walks and he has tried switching shoes which did not help.  Ambulatory and taking Aleve seems to help resolve the pain.   Observations/Objective: Patient sounds cheerful and well on the phone. I do not appreciate any SOB. Speech and thought processing are grossly intact. Patient reported vitals:  Assessment and Plan: 1. Acute right ankle pain -Advised conservative measures over the weekend such as rest, elevation, ice, and anti-inflammatories.  Improvement by early next week and I will have him follow-up in the office.  Okay with this plan   Follow Up Instructions:   I did not refer this patient for an OV in the next 24 hours for this/these issue(s).  I discussed the assessment and treatment plan with the patient. The  patient was provided an opportunity to ask questions and all were answered. The patient agreed with the plan and demonstrated an understanding of the instructions.   The patient was advised to call back or seek an in-person evaluation if the symptoms worsen or if the condition fails to improve as anticipated.  I provided 25 minutes of non-face-to-face time during this encounter.   [name], NP

## 2019-03-01 ENCOUNTER — Ambulatory Visit: Payer: Medicare HMO | Admitting: Adult Health

## 2019-03-01 ENCOUNTER — Telehealth: Payer: Self-pay

## 2019-03-01 NOTE — Telephone Encounter (Signed)
Pt's wife called and states pt was waiting on a phone call for his visit this morning. They did not realize it would be in person, she states when the appt was made they were told they would get a call. She would like to reschedule visit and also schedule one for herself so they can come together.

## 2019-03-02 NOTE — Telephone Encounter (Signed)
Pt called in and scheduled by [place].  Nothing further needed.

## 2019-03-05 DIAGNOSIS — Z7982 Long term (current) use of aspirin: Secondary | ICD-10-CM | POA: Diagnosis not present

## 2019-03-05 DIAGNOSIS — E039 Hypothyroidism, unspecified: Secondary | ICD-10-CM | POA: Diagnosis not present

## 2019-03-05 DIAGNOSIS — Z791 Long term (current) use of non-steroidal anti-inflammatories (NSAID): Secondary | ICD-10-CM | POA: Diagnosis not present

## 2019-03-05 DIAGNOSIS — I10 Essential (primary) hypertension: Secondary | ICD-10-CM | POA: Diagnosis not present

## 2019-03-05 DIAGNOSIS — G8929 Other chronic pain: Secondary | ICD-10-CM | POA: Diagnosis not present

## 2019-03-05 DIAGNOSIS — E785 Hyperlipidemia, unspecified: Secondary | ICD-10-CM | POA: Diagnosis not present

## 2019-03-05 DIAGNOSIS — K219 Gastro-esophageal reflux disease without esophagitis: Secondary | ICD-10-CM | POA: Diagnosis not present

## 2019-03-05 DIAGNOSIS — N529 Male erectile dysfunction, unspecified: Secondary | ICD-10-CM | POA: Diagnosis not present

## 2019-03-05 DIAGNOSIS — N400 Enlarged prostate without lower urinary tract symptoms: Secondary | ICD-10-CM | POA: Diagnosis not present

## 2019-03-05 DIAGNOSIS — Z809 Family history of malignant neoplasm, unspecified: Secondary | ICD-10-CM | POA: Diagnosis not present

## 2019-03-06 ENCOUNTER — Encounter: Payer: Self-pay | Admitting: Adult Health

## 2019-03-06 ENCOUNTER — Other Ambulatory Visit: Payer: Self-pay

## 2019-03-06 ENCOUNTER — Ambulatory Visit (INDEPENDENT_AMBULATORY_CARE_PROVIDER_SITE_OTHER): Payer: Medicare HMO

## 2019-03-06 ENCOUNTER — Ambulatory Visit (INDEPENDENT_AMBULATORY_CARE_PROVIDER_SITE_OTHER): Payer: Medicare HMO | Admitting: Adult Health

## 2019-03-06 VITALS — BP 144/70 | Temp 98.0°F | Wt 165.0 lb

## 2019-03-06 DIAGNOSIS — M7022 Olecranon bursitis, left elbow: Secondary | ICD-10-CM

## 2019-03-06 DIAGNOSIS — M25571 Pain in right ankle and joints of right foot: Secondary | ICD-10-CM

## 2019-03-06 NOTE — Progress Notes (Signed)
Subjective:    Patient ID: Todd Watson, male    DOB: 1941/06/26, 78 y.o.   MRN: [id number]  HPI 78 year old male who  has a past medical history of Arthritis, Diabetes type 2, controlled ([place]), Diverticulitis, GERD (gastroesophageal reflux disease), Herpes zoster, Hiatal hernia, History of kidney stones, Hypothyroidism, and Osteoarthritis of lumbar spine.  He presents to the office today for two issues.   1.  Approximately 2 weeks of right ankle pain.  Has not noticed any improvement or worsening discomfort.  Denies redness, warmth, or swelling to the right ankle.  Denies any aggravating injuries.  Pain is present with ambulation as well as patient.  He has been using Aleve which helps reduce pain severity but has not been resolving.  2.  Olecranon bursitis of left elbow-this was last drained on 01/24/2019.  Reports that he continues to have swelling to the left elbow.  Would like to have it drained again.   Review of Systems See HPI   Past Medical History:  Diagnosis Date  . Arthritis   . Diabetes type 2, controlled ([place])   . Diverticulitis   . GERD (gastroesophageal reflux disease)   . Herpes zoster   . Hiatal hernia   . History of kidney stones   . Hypothyroidism   . Osteoarthritis of lumbar spine     Social History   Socioeconomic History  . Marital status: Married    Spouse name: Not on file  . Number of children: 3  . Years of education: Not on file  . Highest education level: Not on file  Occupational History  . Not on file  Social Needs  . Financial resource strain: Not on file  . Food insecurity:    Worry: Not on file    Inability: Not on file  . Transportation needs:    Medical: Not on file    Non-medical: Not on file  Tobacco Use  . Smoking status: Never Smoker  . Smokeless tobacco: Never Used  Substance and Sexual Activity  . Alcohol use: No  . Drug use: No  . Sexual activity: Not on file  Lifestyle  . Physical activity:    Days per week: Not  on file    Minutes per session: Not on file  . Stress: Not on file  Relationships  . Social connections:    Talks on phone: Not on file    Gets together: Not on file    Attends religious service: Not on file    Active member of club or organization: Not on file    Attends meetings of clubs or organizations: Not on file    Relationship status: Not on file  . Intimate partner violence:    Fear of current or ex partner: Not on file    Emotionally abused: Not on file    Physically abused: Not on file    Forced sexual activity: Not on file  Other Topics Concern  . Not on file  Social History Narrative   Lives with wife.  6 grandchildren.      Past Surgical History:  Procedure Laterality Date  . APPENDECTOMY  2000  . INGUINAL HERNIA REPAIR Bilateral   . SPINE SURGERY     spurs    Family History  Problem Relation Age of Onset  . Heart disease Father        Valve disease  . Kidney failure Mother     No Known Allergies  Current Outpatient Medications on  File Prior to Visit  Medication Sig Dispense Refill  . aspirin 81 MG tablet Take 81 mg by mouth daily.    Marland Kitchen atorvastatin (LIPITOR) 20 MG tablet Take 20 mg by mouth daily.    Marland Kitchen glucosamine-chondroitin 500-400 MG tablet Take 1 tablet by mouth daily.    Marland Kitchen levothyroxine (SYNTHROID, LEVOTHROID) 125 MCG tablet Take 1 tablet (125 mcg total) by mouth daily before breakfast. 90 tablet 3  . lisinopril (PRINIVIL,ZESTRIL) 10 MG tablet Take 5 mg by mouth 2 (two) times daily.     . Multiple Vitamin (MULTIVITAMIN) tablet Take 1 tablet by mouth daily.    . naproxen sodium (ANAPROX) 220 MG tablet Take 220 mg by mouth as needed.    . Omega-3 Fatty Acids (FISH OIL) 1200 MG CAPS Take by mouth.    Marland Kitchen omeprazole (PRILOSEC) 40 MG capsule Take 40 mg by mouth daily.    . Probiotic Product (PROBIOTIC DAILY PO) Take by mouth.    . sildenafil (VIAGRA) 100 MG tablet Take by mouth.    . vitamin C (ASCORBIC ACID) 500 MG tablet Take 500 mg by mouth daily.      No current facility-administered medications on file prior to visit.     BP (!) 144/70   Temp 98 F (36.7 C) (Oral)   Wt 165 lb (74.8 kg)   BMI 25.84 kg/m       Objective:   Physical Exam Vitals signs and nursing note reviewed.  Constitutional:      Appearance: Normal appearance.  HENT:     Mouth/Throat:     Mouth: Mucous membranes are moist.  Cardiovascular:     Rate and Rhythm: Normal rate and regular rhythm.     Pulses: Normal pulses.     Heart sounds: Normal heart sounds.  Pulmonary:     Effort: Pulmonary effort is normal.     Breath sounds: Normal breath sounds.  Musculoskeletal: Normal range of motion.        General: Tenderness present. No swelling or deformity.     Right lower leg: No edema.     Left lower leg: No edema.     Comments: Pain with palpation along the ulnar aspect of talus on right ankle.  He has full range of motion but does have discomfort with external rotation  Skin:    General: Skin is warm and dry.     Capillary Refill: Capillary refill takes less than 2 seconds.     Comments: Ball size area of swelling noted on left elbow.  No redness warmth or tenderness noted    Neurological:     General: No focal deficit present.     Mental Status: He is alert and oriented to person, place, and time.  Psychiatric:        Mood and Affect: Mood normal.        Behavior: Behavior normal.        Thought Content: Thought content normal.        Judgment: Judgment normal.       Assessment & Plan:  1. Acute right ankle pain  - DG Ankle Complete Right; Future  2. Olecranon bursitis of left elbow Procedure:  Drainage of Olecranon bursitis of left elbow Risks, benefits, and alternatives explained and consent obtained. Time out conducted. Surface cleaned with alcohol.  Adequate anesthesia ensured with cold spray Area prepped and draped in a sterile fashion. Using a 16 gauge needle, 10 ml of bloody fluid was removed.  Hemostasis achieved. Pt  stable.  Aftercare and follow-up advised. Ace bandage used for compression.   [name], NP

## 2019-03-13 ENCOUNTER — Other Ambulatory Visit: Payer: Self-pay

## 2019-03-13 ENCOUNTER — Ambulatory Visit (INDEPENDENT_AMBULATORY_CARE_PROVIDER_SITE_OTHER): Payer: Medicare HMO | Admitting: Adult Health

## 2019-03-13 ENCOUNTER — Encounter: Payer: Self-pay | Admitting: Adult Health

## 2019-03-13 VITALS — BP 132/70 | Temp 98.0°F | Wt 166.0 lb

## 2019-03-13 DIAGNOSIS — M7022 Olecranon bursitis, left elbow: Secondary | ICD-10-CM | POA: Diagnosis not present

## 2019-03-13 NOTE — Progress Notes (Signed)
Subjective:    Patient ID: Todd Watson, male    DOB: [birth year], 78 y.o.   MRN: [id number]  HPI 78 year old male who  has a past medical history of Arthritis, Diabetes type 2, controlled ([place]), Diverticulitis, GERD (gastroesophageal reflux disease), Herpes zoster, Hiatal hernia, History of kidney stones, Hypothyroidism, and Osteoarthritis of lumbar spine.  78 year old male who presents to the office today for drainage of left olecranon bursa.  Has had this drained multiple times over the last 2 months.  Review of Systems See HPI   Past Medical History:  Diagnosis Date  . Arthritis   . Diabetes type 2, controlled ([place])   . Diverticulitis   . GERD (gastroesophageal reflux disease)   . Herpes zoster   . Hiatal hernia   . History of kidney stones   . Hypothyroidism   . Osteoarthritis of lumbar spine     Social History   Socioeconomic History  . Marital status: Married    Spouse name: Not on file  . Number of children: 3  . Years of education: Not on file  . Highest education level: Not on file  Occupational History  . Not on file  Social Needs  . Financial resource strain: Not on file  . Food insecurity:    Worry: Not on file    Inability: Not on file  . Transportation needs:    Medical: Not on file    Non-medical: Not on file  Tobacco Use  . Smoking status: Never Smoker  . Smokeless tobacco: Never Used  Substance and Sexual Activity  . Alcohol use: No  . Drug use: No  . Sexual activity: Not on file  Lifestyle  . Physical activity:    Days per week: Not on file    Minutes per session: Not on file  . Stress: Not on file  Relationships  . Social connections:    Talks on phone: Not on file    Gets together: Not on file    Attends religious service: Not on file    Active member of club or organization: Not on file    Attends meetings of clubs or organizations: Not on file    Relationship status: Not on file  . Intimate partner violence:    Fear of current  or ex partner: Not on file    Emotionally abused: Not on file    Physically abused: Not on file    Forced sexual activity: Not on file  Other Topics Concern  . Not on file  Social History Narrative   Lives with wife.  6 grandchildren.      Past Surgical History:  Procedure Laterality Date  . APPENDECTOMY  2000  . INGUINAL HERNIA REPAIR Bilateral   . SPINE SURGERY     spurs    Family History  Problem Relation Age of Onset  . Heart disease Father        Valve disease  . Kidney failure Mother     No Known Allergies  Current Outpatient Medications on File Prior to Visit  Medication Sig Dispense Refill  . aspirin 81 MG tablet Take 81 mg by mouth daily.    Marland Kitchen atorvastatin (LIPITOR) 20 MG tablet Take 20 mg by mouth daily.    Marland Kitchen glucosamine-chondroitin 500-400 MG tablet Take 1 tablet by mouth daily.    Marland Kitchen levothyroxine (SYNTHROID, LEVOTHROID) 125 MCG tablet Take 1 tablet (125 mcg total) by mouth daily before breakfast. 90 tablet 3  . lisinopril (PRINIVIL,ZESTRIL)  10 MG tablet Take 5 mg by mouth 2 (two) times daily.     . Multiple Vitamin (MULTIVITAMIN) tablet Take 1 tablet by mouth daily.    . naproxen sodium (ANAPROX) 220 MG tablet Take 220 mg by mouth as needed.    . Omega-3 Fatty Acids (FISH OIL) 1200 MG CAPS Take by mouth.    Marland Kitchen omeprazole (PRILOSEC) 40 MG capsule Take 40 mg by mouth daily.    . Probiotic Product (PROBIOTIC DAILY PO) Take by mouth.    . sildenafil (VIAGRA) 100 MG tablet Take by mouth.    . vitamin C (ASCORBIC ACID) 500 MG tablet Take 500 mg by mouth daily.     No current facility-administered medications on file prior to visit.     BP 132/70   Temp 98 F (36.7 C) (Temporal)   Wt 166 lb (75.3 kg)   BMI 26.00 kg/m       Objective:   Physical Exam Vitals signs and nursing note reviewed.  Constitutional:      Appearance: Normal appearance.  Skin:    General: Skin is warm and dry.     Capillary Refill: Capillary refill takes less than 2 seconds.      Comments: Golf ball sized area of swelling on left elbow  Neurological:     General: No focal deficit present.     Mental Status: He is alert and oriented to person, place, and time. Mental status is at baseline.  Psychiatric:        Mood and Affect: Mood normal.        Behavior: Behavior normal.        Thought Content: Thought content normal.        Judgment: Judgment normal.       Assessment & Plan:  1. Olecranon bursitis of left elbow Procedure:  Drainage of left bursa  Risks, benefits, and alternatives explained and consent obtained. Time out conducted. Surface cleaned with alcohol and betadine Adequate anesthesia ensured with cold spray Area prepped and draped in a sterile fashion. Using a 16 gauge needle, 12 ml of bloody clear fluid was removed Hemostasis achieved. Pt stable. Aftercare and follow-up advised including using Ace Bandage for compression.   - AMB referral to orthopedics d/t not resolving.   [name], NP

## 2019-03-14 ENCOUNTER — Telehealth: Payer: Self-pay | Admitting: *Deleted

## 2019-03-14 NOTE — Telephone Encounter (Signed)
Copied from [place] [id number]. Topic: Referral - Status >> Mar 14, 2019 11:23 AM Rutherford Nail, NT wrote: Reason for CRM: Patient calling to check the status of his referral to [facility]. States that he called them and they have not received any information on his referral. Please advise. Patient states that a number was given to send the referral- [phone]

## 2019-03-15 DIAGNOSIS — M7022 Olecranon bursitis, left elbow: Secondary | ICD-10-CM | POA: Diagnosis not present

## 2019-03-15 DIAGNOSIS — E119 Type 2 diabetes mellitus without complications: Secondary | ICD-10-CM | POA: Diagnosis not present

## 2019-03-15 DIAGNOSIS — M25522 Pain in left elbow: Secondary | ICD-10-CM | POA: Diagnosis not present

## 2019-03-15 DIAGNOSIS — I10 Essential (primary) hypertension: Secondary | ICD-10-CM | POA: Diagnosis not present

## 2019-03-15 DIAGNOSIS — Z79899 Other long term (current) drug therapy: Secondary | ICD-10-CM | POA: Diagnosis not present

## 2019-03-15 DIAGNOSIS — E079 Disorder of thyroid, unspecified: Secondary | ICD-10-CM | POA: Diagnosis not present

## 2019-03-15 NOTE — Telephone Encounter (Signed)
Spoke to the pt and advised it will most likely be next week before he is contacted for an appointment.  Nothing further needed at this time.

## 2019-03-20 DIAGNOSIS — M7022 Olecranon bursitis, left elbow: Secondary | ICD-10-CM | POA: Diagnosis not present

## 2019-03-21 DIAGNOSIS — L0100 Impetigo, unspecified: Secondary | ICD-10-CM | POA: Diagnosis not present

## 2019-03-21 DIAGNOSIS — K130 Diseases of lips: Secondary | ICD-10-CM | POA: Diagnosis not present

## 2019-04-19 DIAGNOSIS — L0100 Impetigo, unspecified: Secondary | ICD-10-CM | POA: Diagnosis not present

## 2019-04-19 DIAGNOSIS — M7022 Olecranon bursitis, left elbow: Secondary | ICD-10-CM | POA: Diagnosis not present

## 2019-04-19 DIAGNOSIS — K130 Diseases of lips: Secondary | ICD-10-CM | POA: Diagnosis not present

## 2019-05-01 DIAGNOSIS — I10 Essential (primary) hypertension: Secondary | ICD-10-CM | POA: Diagnosis not present

## 2019-05-01 DIAGNOSIS — M5416 Radiculopathy, lumbar region: Secondary | ICD-10-CM | POA: Diagnosis not present

## 2019-05-01 DIAGNOSIS — Z6825 Body mass index (BMI) 25.0-25.9, adult: Secondary | ICD-10-CM | POA: Diagnosis not present

## 2019-05-03 ENCOUNTER — Other Ambulatory Visit: Payer: Self-pay

## 2019-05-03 ENCOUNTER — Encounter: Payer: Self-pay | Admitting: Adult Health

## 2019-05-03 ENCOUNTER — Ambulatory Visit (INDEPENDENT_AMBULATORY_CARE_PROVIDER_SITE_OTHER): Payer: Medicare HMO | Admitting: Adult Health

## 2019-05-03 VITALS — BP 136/70 | Temp 98.2°F | Wt 158.0 lb

## 2019-05-03 DIAGNOSIS — L821 Other seborrheic keratosis: Secondary | ICD-10-CM | POA: Diagnosis not present

## 2019-05-03 NOTE — Progress Notes (Signed)
Subjective:    Patient ID: Todd Watson, male    DOB: 1940-10-07, 78 y.o.   MRN: [id number]  HPI 78 year old male who  has a past medical history of Arthritis, Diabetes type 2, controlled ([place]), Diverticulitis, GERD (gastroesophageal reflux disease), Herpes zoster, Hiatal hernia, History of kidney stones, Hypothyroidism, and Osteoarthritis of lumbar spine.  He presents to the office today for an acute issue.  Few weeks ago he noticed a spot under his left eye that appeared irritated.  He reports mild tenderness as well as a waxy "scab".  The scab sometimes falls off but then will grow back.   Review of Systems See HPI   Past Medical History:  Diagnosis Date  . Arthritis   . Diabetes type 2, controlled ([place])   . Diverticulitis   . GERD (gastroesophageal reflux disease)   . Herpes zoster   . Hiatal hernia   . History of kidney stones   . Hypothyroidism   . Osteoarthritis of lumbar spine     Social History   Socioeconomic History  . Marital status: Married    Spouse name: Not on file  . Number of children: 3  . Years of education: Not on file  . Highest education level: Not on file  Occupational History  . Not on file  Social Needs  . Financial resource strain: Not on file  . Food insecurity    Worry: Not on file    Inability: Not on file  . Transportation needs    Medical: Not on file    Non-medical: Not on file  Tobacco Use  . Smoking status: Never Smoker  . Smokeless tobacco: Never Used  Substance and Sexual Activity  . Alcohol use: No  . Drug use: No  . Sexual activity: Not on file  Lifestyle  . Physical activity    Days per week: Not on file    Minutes per session: Not on file  . Stress: Not on file  Relationships  . Social Herbalist on phone: Not on file    Gets together: Not on file    Attends religious service: Not on file    Active member of club or organization: Not on file    Attends meetings of clubs or organizations: Not on file    Relationship status: Not on file  . Intimate partner violence    Fear of current or ex partner: Not on file    Emotionally abused: Not on file    Physically abused: Not on file    Forced sexual activity: Not on file  Other Topics Concern  . Not on file  Social History Narrative   Lives with wife.  6 grandchildren.      Past Surgical History:  Procedure Laterality Date  . APPENDECTOMY  2000  . INGUINAL HERNIA REPAIR Bilateral   . SPINE SURGERY     spurs    Family History  Problem Relation Age of Onset  . Heart disease Father        Valve disease  . Kidney failure Mother     No Known Allergies  Current Outpatient Medications on File Prior to Visit  Medication Sig Dispense Refill  . aspirin 81 MG tablet Take 81 mg by mouth daily.    Marland Kitchen atorvastatin (LIPITOR) 20 MG tablet Take 20 mg by mouth daily.    Marland Kitchen glucosamine-chondroitin 500-400 MG tablet Take 1 tablet by mouth daily.    Marland Kitchen levothyroxine (SYNTHROID, LEVOTHROID)  125 MCG tablet Take 1 tablet (125 mcg total) by mouth daily before breakfast. 90 tablet 3  . lisinopril (PRINIVIL,ZESTRIL) 10 MG tablet Take 5 mg by mouth 2 (two) times daily.     . Multiple Vitamin (MULTIVITAMIN) tablet Take 1 tablet by mouth daily.    . naproxen sodium (ANAPROX) 220 MG tablet Take 220 mg by mouth as needed.    . Omega-3 Fatty Acids (FISH OIL) 1200 MG CAPS Take by mouth.    Marland Kitchen omeprazole (PRILOSEC) 40 MG capsule Take 40 mg by mouth daily.    . Probiotic Product (PROBIOTIC DAILY PO) Take by mouth.    . sildenafil (VIAGRA) 100 MG tablet Take by mouth.    . vitamin C (ASCORBIC ACID) 500 MG tablet Take 500 mg by mouth daily.     No current facility-administered medications on file prior to visit.     BP 136/70   Temp 98.2 F (36.8 C)   Wt 158 lb (71.7 kg)   BMI 24.75 kg/m       Objective:   Physical Exam Vitals signs and nursing note reviewed.  Constitutional:      Appearance: Normal appearance.  Skin:    General: Skin is warm and  dry.     Comments: 3 mm sharply defined light brown macule under left eye  Neurological:     General: No focal deficit present.     Mental Status: He is alert and oriented to person, place, and time.  Psychiatric:        Mood and Affect: Mood normal.        Behavior: Behavior normal.        Thought Content: Thought content normal.        Judgment: Judgment normal.       Assessment & Plan:  1. Seborrheic keratosis - Reassurance given that this was not skin cancer.  - He would like to have cryotherapy for this issue  Procedure note: Benefits and risks verbally discussed with patient First freeze thaw cycle of cryotherapy performed  with liquid nitrogen to left upper cheek.No complications.  Patient tolerated the procedure well other than mild pain.  [name], NP

## 2019-05-17 DIAGNOSIS — M11262 Other chondrocalcinosis, left knee: Secondary | ICD-10-CM | POA: Diagnosis not present

## 2019-05-17 DIAGNOSIS — M25562 Pain in left knee: Secondary | ICD-10-CM | POA: Diagnosis not present

## 2019-05-23 DIAGNOSIS — M5416 Radiculopathy, lumbar region: Secondary | ICD-10-CM | POA: Diagnosis not present

## 2019-05-23 DIAGNOSIS — S335XXD Sprain of ligaments of lumbar spine, subsequent encounter: Secondary | ICD-10-CM | POA: Diagnosis not present

## 2019-05-28 DIAGNOSIS — S335XXD Sprain of ligaments of lumbar spine, subsequent encounter: Secondary | ICD-10-CM | POA: Diagnosis not present

## 2019-05-28 DIAGNOSIS — M5416 Radiculopathy, lumbar region: Secondary | ICD-10-CM | POA: Diagnosis not present

## 2019-06-14 DIAGNOSIS — S335XXD Sprain of ligaments of lumbar spine, subsequent encounter: Secondary | ICD-10-CM | POA: Diagnosis not present

## 2019-06-14 DIAGNOSIS — M7022 Olecranon bursitis, left elbow: Secondary | ICD-10-CM | POA: Diagnosis not present

## 2019-06-14 DIAGNOSIS — M5416 Radiculopathy, lumbar region: Secondary | ICD-10-CM | POA: Diagnosis not present

## 2019-06-19 DIAGNOSIS — S335XXD Sprain of ligaments of lumbar spine, subsequent encounter: Secondary | ICD-10-CM | POA: Diagnosis not present

## 2019-06-19 DIAGNOSIS — M5416 Radiculopathy, lumbar region: Secondary | ICD-10-CM | POA: Diagnosis not present

## 2019-06-21 DIAGNOSIS — M5416 Radiculopathy, lumbar region: Secondary | ICD-10-CM | POA: Diagnosis not present

## 2019-06-21 DIAGNOSIS — S335XXD Sprain of ligaments of lumbar spine, subsequent encounter: Secondary | ICD-10-CM | POA: Diagnosis not present

## 2019-06-26 DIAGNOSIS — S335XXD Sprain of ligaments of lumbar spine, subsequent encounter: Secondary | ICD-10-CM | POA: Diagnosis not present

## 2019-06-26 DIAGNOSIS — M5416 Radiculopathy, lumbar region: Secondary | ICD-10-CM | POA: Diagnosis not present

## 2019-06-28 DIAGNOSIS — S335XXD Sprain of ligaments of lumbar spine, subsequent encounter: Secondary | ICD-10-CM | POA: Diagnosis not present

## 2019-06-28 DIAGNOSIS — M5416 Radiculopathy, lumbar region: Secondary | ICD-10-CM | POA: Diagnosis not present

## 2019-07-03 DIAGNOSIS — M5416 Radiculopathy, lumbar region: Secondary | ICD-10-CM | POA: Diagnosis not present

## 2019-07-12 ENCOUNTER — Encounter: Payer: Self-pay | Admitting: Family Medicine

## 2019-07-20 DIAGNOSIS — R69 Illness, unspecified: Secondary | ICD-10-CM | POA: Diagnosis not present

## 2019-08-02 DIAGNOSIS — B372 Candidiasis of skin and nail: Secondary | ICD-10-CM | POA: Diagnosis not present

## 2019-08-02 DIAGNOSIS — L0100 Impetigo, unspecified: Secondary | ICD-10-CM | POA: Diagnosis not present

## 2019-08-27 DIAGNOSIS — M5416 Radiculopathy, lumbar region: Secondary | ICD-10-CM | POA: Diagnosis not present

## 2019-09-06 DIAGNOSIS — R972 Elevated prostate specific antigen [PSA]: Secondary | ICD-10-CM | POA: Diagnosis not present

## 2019-09-13 DIAGNOSIS — M5416 Radiculopathy, lumbar region: Secondary | ICD-10-CM | POA: Diagnosis not present

## 2019-09-13 DIAGNOSIS — M5440 Lumbago with sciatica, unspecified side: Secondary | ICD-10-CM | POA: Diagnosis not present

## 2019-09-18 DIAGNOSIS — N5201 Erectile dysfunction due to arterial insufficiency: Secondary | ICD-10-CM | POA: Diagnosis not present

## 2019-09-18 DIAGNOSIS — N401 Enlarged prostate with lower urinary tract symptoms: Secondary | ICD-10-CM | POA: Diagnosis not present

## 2019-09-18 DIAGNOSIS — R351 Nocturia: Secondary | ICD-10-CM | POA: Diagnosis not present

## 2019-09-18 DIAGNOSIS — Z125 Encounter for screening for malignant neoplasm of prostate: Secondary | ICD-10-CM | POA: Diagnosis not present

## 2019-09-29 ENCOUNTER — Emergency Department (HOSPITAL_COMMUNITY)
Admission: EM | Admit: 2019-09-29 | Discharge: 2019-09-29 | Disposition: A | Discharge status: Home / Self Care | Payer: Medicare HMO | Attending: Emergency Medicine | Admitting: Emergency Medicine

## 2019-09-29 ENCOUNTER — Encounter (HOSPITAL_COMMUNITY): Payer: Self-pay

## 2019-09-29 ENCOUNTER — Other Ambulatory Visit: Payer: Self-pay

## 2019-09-29 ENCOUNTER — Emergency Department (HOSPITAL_COMMUNITY): Payer: Medicare HMO

## 2019-09-29 DIAGNOSIS — E039 Hypothyroidism, unspecified: Secondary | ICD-10-CM | POA: Diagnosis not present

## 2019-09-29 DIAGNOSIS — K5900 Constipation, unspecified: Secondary | ICD-10-CM | POA: Diagnosis not present

## 2019-09-29 DIAGNOSIS — Z79899 Other long term (current) drug therapy: Secondary | ICD-10-CM | POA: Insufficient documentation

## 2019-09-29 DIAGNOSIS — R1032 Left lower quadrant pain: Secondary | ICD-10-CM

## 2019-09-29 DIAGNOSIS — N200 Calculus of kidney: Secondary | ICD-10-CM | POA: Diagnosis not present

## 2019-09-29 DIAGNOSIS — E119 Type 2 diabetes mellitus without complications: Secondary | ICD-10-CM | POA: Diagnosis not present

## 2019-09-29 DIAGNOSIS — Z7982 Long term (current) use of aspirin: Secondary | ICD-10-CM | POA: Insufficient documentation

## 2019-09-29 DIAGNOSIS — I10 Essential (primary) hypertension: Secondary | ICD-10-CM | POA: Diagnosis not present

## 2019-09-29 LAB — URINALYSIS, ROUTINE W REFLEX MICROSCOPIC
Bilirubin Urine: NEGATIVE
Glucose, UA: NEGATIVE mg/dL
Hgb urine dipstick: NEGATIVE
Ketones, ur: NEGATIVE mg/dL
Leukocytes,Ua: NEGATIVE
Nitrite: NEGATIVE
Protein, ur: NEGATIVE mg/dL
Specific Gravity, Urine: 1.006 (ref 1.005–1.030)
pH: 6 (ref 5.0–8.0)

## 2019-09-29 LAB — COMPREHENSIVE METABOLIC PANEL
ALT: 31 U/L (ref 0–44)
AST: 26 U/L (ref 15–41)
Albumin: 4 g/dL (ref 3.5–5.0)
Alkaline Phosphatase: 49 U/L (ref 38–126)
Anion gap: 9 (ref 5–15)
BUN: 16 mg/dL (ref 8–23)
CO2: 27 mmol/L (ref 22–32)
Calcium: 9.1 mg/dL (ref 8.9–10.3)
Chloride: 103 mmol/L (ref 98–111)
Creatinine, Ser: 0.85 mg/dL (ref 0.61–1.24)
GFR calc Af Amer: >60 mL/min (ref >60)
GFR calc non Af Amer: >60 mL/min (ref >60)
Glucose, Bld: 170 mg/dL — ABNORMAL HIGH (ref 70–99)
Potassium: 4.2 mmol/L (ref 3.5–5.1)
Sodium: 139 mmol/L (ref 135–145)
Total Bilirubin: 0.7 mg/dL (ref 0.3–1.2)
Total Protein: 7.1 g/dL (ref 6.5–8.1)

## 2019-09-29 LAB — CBC
HCT: 43.4 % (ref 39.0–52.0)
Hemoglobin: 14.4 g/dL (ref 13.0–17.0)
MCH: 32.5 pg (ref 26.0–34.0)
MCHC: 33.2 g/dL (ref 30.0–36.0)
MCV: 98 fL (ref 80.0–100.0)
Platelets: 162 10*3/uL (ref 150–400)
RBC: 4.43 MIL/uL (ref 4.22–5.81)
RDW: 13 % (ref 11.5–15.5)
WBC: 4.7 10*3/uL (ref 4.0–10.5)
nRBC: 0 % (ref 0.0–0.2)

## 2019-09-29 LAB — LIPASE, BLOOD: Lipase: 30 U/L (ref 11–51)

## 2019-09-29 MED ORDER — IOHEXOL 300 MG/ML  SOLN
100.0000 mL | Freq: Once | INTRAMUSCULAR | Status: AC | PRN
  Administered 2019-09-29: 14:00:00 100 mL via INTRAVENOUS

## 2019-09-29 MED ORDER — SODIUM CHLORIDE (PF) 0.9 % IJ SOLN
INTRAMUSCULAR | Status: AC
  Filled 2019-09-29: qty 50

## 2019-09-29 MED ORDER — SODIUM CHLORIDE 0.9% FLUSH
3.0000 mL | Freq: Once | INTRAVENOUS | Status: AC
  Administered 2019-09-29: 3 mL via INTRAVENOUS

## 2019-09-29 NOTE — ED Provider Notes (Signed)
[facility] DEPT Provider Note   CSN: [id number] Arrival date & time: 09/29/19  0911     History Chief Complaint  Patient presents with   Abdominal Pain    Todd Watson is a 78 y.o. male with PMHx Diabetes, diverticulitis, GERD, hiatal hernia who presents to the ED today complaining of sudden onset, constant, unchanged, LLQ pain that began 3 days ago.  She reports while he was working outside the other day he felt a pain in his left inguinal area.  He states that he immediately felt the area and did not feel an obvious hernia.  Patient has history of bilateral inguinal hernias with repair approximately 20 years ago.  States that that pain went away but he has now had left lower quadrant pain for the past 3 days that has been consistent in nature.  Patient states he is also felt constipated.  He states he is having very small pelleted stools.  Patient tried taking MiraLAX as well as castor oil without relief.  He states he is still passing gas.  No nausea or vomiting.  Patient does report history of diverticulitis and states this feels similar.  He states last time he had diverticulitis it was due to eating popcorn, patient denies eating anything with seeds in it recently.  Denies fever, chills.  Surgical history includes appendectomy.   The history is provided by the patient.       Past Medical History:  Diagnosis Date   Arthritis    Diabetes type 2, controlled ([place])    Diverticulitis    GERD (gastroesophageal reflux disease)    Herpes zoster    Hiatal hernia    History of kidney stones    Hypothyroidism    Osteoarthritis of lumbar spine     Patient Active Problem List   Diagnosis Date Noted   Bradycardia 07/31/2015   Essential hypertension, benign 07/04/2014   Hypothyroidism 07/04/2014   GERD (gastroesophageal reflux disease) 07/04/2014   Lumbar spondylosis 07/04/2014   Diabetes ([place]) 07/04/2014   Cholelithiases 07/04/2014     Herpes zoster 07/04/2014   History of renal calculi 07/04/2014   Diverticulitis of colon 07/04/2014   Hiatal hernia 07/04/2014    Past Surgical History:  Procedure Laterality Date   APPENDECTOMY  2000   INGUINAL HERNIA REPAIR Bilateral    SPINE SURGERY     spurs       Family History  Problem Relation Age of Onset   Heart disease Father        Valve disease   Kidney failure Mother     Social History   Tobacco Use   Smoking status: Never Smoker   Smokeless tobacco: Never Used  Substance Use Topics   Alcohol use: No   Drug use: No    Home Medications Prior to Admission medications   Medication Sig Start Date End Date Taking? Authorizing Provider  aspirin 81 MG tablet Take 81 mg by mouth daily.    [provider]  atorvastatin (LIPITOR) 20 MG tablet Take 20 mg by mouth daily.    [provider]  glucosamine-chondroitin 500-400 MG tablet Take 1 tablet by mouth daily.    [provider]  levothyroxine (SYNTHROID, LEVOTHROID) 125 MCG tablet Take 1 tablet (125 mcg total) by mouth daily before breakfast. 04/12/17   Nafziger, Tommi Rumps, NP  lisinopril (PRINIVIL,ZESTRIL) 10 MG tablet Take 5 mg by mouth 2 (two) times daily.     [provider]  Multiple Vitamin (MULTIVITAMIN)  tablet Take 1 tablet by mouth daily.    [provider]  naproxen sodium (ANAPROX) 220 MG tablet Take 220 mg by mouth as needed.    [provider]  Omega-3 Fatty Acids (FISH OIL) 1200 MG CAPS Take by mouth.    [provider]  omeprazole (PRILOSEC) 40 MG capsule Take 40 mg by mouth daily.    [provider]  Probiotic Product (PROBIOTIC DAILY PO) Take by mouth.    [provider]  sildenafil (VIAGRA) 100 MG tablet Take by mouth.    [provider]  vitamin C (ASCORBIC ACID) 500 MG tablet Take 500 mg by mouth daily.    [provider]    Allergies    Patient has no known allergies.  Review of  Systems   Review of Systems  Constitutional: Negative for chills and fever.  Gastrointestinal: Positive for abdominal pain and constipation. Negative for diarrhea, nausea and vomiting.  All other systems reviewed and are negative.   Physical Exam Updated Vital Signs BP (!) 175/101    Pulse (!) 52    Temp 98.1 F (36.7 C) (Oral)    Resp 16    SpO2 100%   Physical Exam Vitals and nursing note reviewed.  Constitutional:      Appearance: He is not ill-appearing or diaphoretic.  HENT:     Head: Normocephalic and atraumatic.  Eyes:     Conjunctiva/sclera: Conjunctivae normal.  Cardiovascular:     Rate and Rhythm: Normal rate and regular rhythm.     Heart sounds: Normal heart sounds.  Pulmonary:     Effort: Pulmonary effort is normal.     Breath sounds: Normal breath sounds. No wheezing, rhonchi or rales.  Abdominal:     Palpations: Abdomen is soft. There is no mass.     Tenderness: There is abdominal tenderness in the left lower quadrant. There is no right CVA tenderness, left CVA tenderness, guarding or rebound.     Comments: Soft, + LLQ abdominal pain, +BS throughout, no r/g/r, neg murphy's, neg mcburney's, no CVA TTP, no hernia palpated  Musculoskeletal:     Cervical back: Neck supple.  Skin:    General: Skin is warm and dry.  Neurological:     Mental Status: He is alert.     ED Results / Procedures / Treatments   Labs (all labs ordered are listed, but only abnormal results are displayed) Labs Reviewed  COMPREHENSIVE METABOLIC PANEL - Abnormal; Notable for the following components:      Result Value   Glucose, Bld 170 (*)    All other components within normal limits  URINALYSIS, ROUTINE W REFLEX MICROSCOPIC - Abnormal; Notable for the following components:   Color, Urine STRAW (*)    All other components within normal limits  LIPASE, BLOOD  CBC    EKG None  Radiology CT Abdomen Pelvis W Contrast  Result Date: 09/29/2019 CLINICAL DATA:  3 day history of left  lower quadrant pain. EXAM: CT ABDOMEN AND PELVIS WITH CONTRAST TECHNIQUE: Multidetector CT imaging of the abdomen and pelvis was performed using the standard protocol following bolus administration of intravenous contrast. CONTRAST:  150mL OMNIPAQUE IOHEXOL 300 MG/ML  SOLN COMPARISON:  No comparison studies available. FINDINGS: Lower chest: Heart size upper normal. Coronary artery calcification is evident. Hepatobiliary: No suspicious focal abnormality within the liver parenchyma. Calcified gallstones measure up to about 10 mm. No intrahepatic or extrahepatic biliary dilation. Pancreas: No focal mass lesion. No dilatation of the main duct.  No intraparenchymal cyst. No peripancreatic edema. Spleen: No splenomegaly. No focal mass lesion. Adrenals/Urinary Tract: No adrenal nodule or mass. Right kidney unremarkable. 4 x 6 mm nonobstructing stone noted lower pole left kidney. Central sinus cysts noted left kidney. No evidence for hydroureter. The urinary bladder appears normal for the degree of distention. Stomach/Bowel: Small hiatal hernia. Stomach otherwise unremarkable. Duodenum is normally positioned as is the ligament of Treitz. No small bowel wall thickening. No small bowel dilatation. The terminal ileum is normal. The appendix is not visualized, but there is no edema or inflammation in the region of the cecum. No gross colonic mass. No colonic wall thickening. Diverticular changes are noted in the left colon without evidence of diverticulitis. Vascular/Lymphatic: There is abdominal aortic atherosclerosis without aneurysm. There is no gastrohepatic or hepatoduodenal ligament lymphadenopathy. No retroperitoneal or mesenteric lymphadenopathy. No pelvic sidewall lymphadenopathy. Reproductive: The prostate gland and seminal vesicles are unremarkable. Other: No intraperitoneal free fluid. Musculoskeletal: No worrisome lytic or sclerotic osseous abnormality. Degenerative disc disease noted L5-S1. IMPRESSION: 1. No acute  findings in the abdomen or pelvis. No findings to explain the patient's history of left lower quadrant pain. There is some minimal diverticular disease in the left colon but no findings of diverticulitis. 2. 4 x 6 mm nonobstructing left renal stone. No secondary changes in the left kidney or ureter. 3.  Aortic Atherosclerois (ICD10-170.0) 4. Small hiatal hernia. Electronically Signed   By: Misty Stanley M.D.   On: 09/29/2019 14:23    Procedures Procedures (including critical care time)  Medications Ordered in ED Medications  sodium chloride (PF) 0.9 % injection (has no administration in time range)  sodium chloride flush (NS) 0.9 % injection 3 mL (3 mLs Intravenous Given 09/29/19 1252)  iohexol (OMNIPAQUE) 300 MG/ML solution 100 mL (100 mLs Intravenous Contrast Given 09/29/19 1351)    ED Course  I have reviewed the triage vital signs and the nursing notes.  Pertinent labs & imaging results that were available during my care of the patient were reviewed by me and considered in my medical decision making (see chart for details).  78 year old male who presents the ED today complaining of left lower quadrant abdominal pain for the past 3 days.  No nausea, vomiting, diarrhea.  States that he has continued to have bowel movements although feels mildly constipated.  States that his bowels have been small and pellet did as of late.  Patient has tried MiraLAX once and castor oil without relief of his pain although he states that it has helped him go to the bathroom a little bit more.  He does have history of diverticulitis.  His vitals are stable today.  He is afebrile without tachycardia or tachypnea.  He is a very pleasant gentleman and has only minimal tenderness in his left lower quadrant.  He is able to speak fully throughout the exam and only slightly reacts to patient his left lower quadrant.  Patient does have history of bilateral inguinal hernia repairs several years ago, no obvious hernias  appreciated on my exam today.  Given age will obtain CT scan to rule out other etiology including acute abdomen.  Past surgical history does include appendectomy.  Screening labs also obtained.  Patient does not want anything for pain at this time.   Lab work unremarkable.  CBC without leukocytosis.  Hemoglobin stable.  There are no electrolyte abnormalities on his CMP today.  Creatinine is within normal limits.  No elevation in LFTs.  Face is within normal limits.  Urinalysis without infection today.  Awaiting CT scan at this time although suspect patient may now be having pain more due to mild constipation versus diverticulitis as I would suspect elevation in white blood cell count.   CT scan without obvious finding for patient's pain.  Incidental finding of a 4 x 6 mm stone in the left kidney.  Do not suspect this is causing patient's pain today as it is still inside the kidney itself.  No hydronephrosis.  Patient has no CVA tenderness on exam.  We will have him follow-up with his PCP regarding this. No signs of stool burden although patient does have large amount of stool on his CT scan today.  Will advise he take MiraLAX daily and to increase water intake and fiber.  Right ED visit patient did mention that he had eaten Janine Limbo the other day and it had a lot of cheese in it which he thinks may have led to his constipation.  On reevaluation patient resting comfortably.  I discussed CT scan findings with him.  He is in agreement to take MiraLAX daily and to increase his water and fiber and to follow-up with his PCP.  Feel that this will help patient significantly his symptoms.  Again he has very minimal tenderness to left lower quadrant does not appear to be in any acute distress.  Do not feel patient needs enema or other stool softeners in the ED itself.  Do not feel he needs additional work-up at this time.  Patient stable for discharge home.   This note was prepared using Dragon voice recognition  software and may include unintentional dictation errors due to the inherent limitations of voice recognition software.     MDM Rules/Calculators/A&P                       Final Clinical Impression(s) / ED Diagnoses Final diagnoses:  Left lower quadrant abdominal pain  Constipation, unspecified constipation type    Rx / DC Orders ED Discharge Orders    None       Discharge Instructions     Please take Miralax daily to help soften your stools. Increase your water intake and eat foods rich in fiber. See attached information.   Your CT scan did show a kidney stone in the left kidney although I do not suspect this is causing your symptoms today. Follow up with your PCP regarding your ED visit today as well as in regards to your kidney stone.        Eustaquio Maize, PA-C 09/29/19 [place], [place], DO 09/29/19 1453

## 2019-09-29 NOTE — ED Notes (Signed)
Patient transported to CT 

## 2019-09-29 NOTE — Discharge Instructions (Addendum)
Please take Miralax daily to help soften your stools. Increase your water intake and eat foods rich in fiber. See attached information.   Your CT scan did show a kidney stone in the left kidney although I do not suspect this is causing your symptoms today. Follow up with your PCP regarding your ED visit today as well as in regards to your kidney stone.

## 2019-09-29 NOTE — ED Triage Notes (Addendum)
Pt states that he is having LLQ pain x3 days. Pt states that he has had gas and been constipated. Pt states that he tried castor oil without relief. No emesis.

## 2019-10-12 ENCOUNTER — Ambulatory Visit (INDEPENDENT_AMBULATORY_CARE_PROVIDER_SITE_OTHER): Payer: Medicare HMO

## 2019-10-12 VITALS — Ht 67.0 in | Wt 160.0 lb

## 2019-10-12 DIAGNOSIS — Z0000 Encounter for general adult medical examination without abnormal findings: Secondary | ICD-10-CM | POA: Diagnosis not present

## 2019-10-12 NOTE — Patient Instructions (Addendum)
[name] , Thank you for taking time to come for your Medicare Wellness Visit. I appreciate your ongoing commitment to your health goals. Please review the following plan we discussed and let me know if I can assist you in the future.   Please contact the [facility] and ask for documentation for Tetanus and pneumonia vaccines. Please provide documentation or dates to our office.   Screening recommendations/referrals: Colorectal Screening: aged out  Vision and Dental Exams: Recommended annual ophthalmology exams for early detection of glaucoma and other disorders of the eye. The [facility] manages his eye care. Recommended annual dental exams for proper oral hygiene. He sees a Database administrator twice a year.  Diabetic Exams: Diabetic Eye Exam: [facility] manages eye care. Diabetic Foot Exam: [facility] performs per patient report.  Vaccinations: Influenza vaccine: patient's wife states this was done in Fall 2020 by [facility]. Pneumococcal vaccine: will have patient contact [facility] for any records for PPS-23. PCV13 was given 07/04/2014. Tdap vaccine: No documentation on file. will have patient contact [facility] for any records Shingles vaccine: completed 04/03/2017 & 07/04/2017.  Advanced directives: Advance directives discussed with you today. You have declined to receive documents for completion.  Goals: Recommend to drink at least 6-8 8oz glasses of water per day.  Recommend to exercise for at least 150 minutes per week.   Recommend to remove any items from the home that may cause slips or trips.   Next appointment: Please schedule your Annual Wellness Visit with your Nurse Health Advisor in one year.  Preventive Care 3 Years and Older, Male Preventive care refers to lifestyle choices and visits with your health care provider that can promote health and wellness. What does preventive care include?  A yearly physical exam. This is also called an annual well check.  Dental exams once or twice a year.  Routine eye  exams. Ask your health care provider how often you should have your eyes checked.  Personal lifestyle choices, including:  Daily care of your teeth and gums.  Regular physical activity.  Eating a healthy diet.  Avoiding tobacco and drug use.  Limiting alcohol use.  Practicing safe sex.  Taking low doses of aspirin every day if recommended by your health care provider..  Taking vitamin and mineral supplements as recommended by your health care provider. What happens during an annual well check? The services and screenings done by your health care provider during your annual well check will depend on your age, overall health, lifestyle risk factors, and family history of disease. Counseling  Your health care provider may ask you questions about your:  Alcohol use.  Tobacco use.  Drug use.  Emotional well-being.  Home and relationship well-being.  Sexual activity.  Eating habits.  History of falls.  Memory and ability to understand (cognition).  Work and work Statistician. Screening  You may have the following tests or measurements:  Height, weight, and BMI.  Blood pressure.  Lipid and cholesterol levels. These may be checked every 5 years, or more frequently if you are over 44 years old.  Skin check.  Lung cancer screening. You may have this screening every year starting at age 31 if you have a 30-pack-year history of smoking and currently smoke or have quit within the past 15 years.  Fecal occult blood test (FOBT) of the stool. You may have this test every year starting at age 25.  Flexible sigmoidoscopy or colonoscopy. You may have a sigmoidoscopy every 5 years or a colonoscopy every 10 years  starting at age 29.  Prostate cancer screening. Recommendations will vary depending on your family history and other risks.  Hepatitis C blood test.  Hepatitis B blood test.  Sexually transmitted disease (STD) testing.  Diabetes screening. This is done by  checking your blood sugar (glucose) after you have not eaten for a while (fasting). You may have this done every 1-3 years.  Abdominal aortic aneurysm (AAA) screening. You may need this if you are a current or former smoker.  Osteoporosis. You may be screened starting at age 33 if you are at high risk. Talk with your health care provider about your test results, treatment options, and if necessary, the need for more tests. Vaccines  Your health care provider may recommend certain vaccines, such as:  Influenza vaccine. This is recommended every year.  Tetanus, diphtheria, and acellular pertussis (Tdap, Td) vaccine. You may need a Td booster every 10 years.  Zoster vaccine. You may need this after age 91.  Pneumococcal 13-valent conjugate (PCV13) vaccine. One dose is recommended after age 71.  Pneumococcal polysaccharide (PPSV23) vaccine. One dose is recommended after age 71. Talk to your health care provider about which screenings and vaccines you need and how often you need them. This information is not intended to replace advice given to you by your health care provider. Make sure you discuss any questions you have with your health care provider. Document Released: 10/17/2015 Document Revised: 06/09/2016 Document Reviewed: 07/22/2015 Elsevier Interactive Patient Education  2017 [employer] Prevention in the Home Falls can cause injuries. They can happen to people of all ages. There are many things you can do to make your home safe and to help prevent falls. What can I do on the outside of my home?  Regularly fix the edges of walkways and driveways and fix any cracks.  Remove anything that might make you trip as you walk through a door, such as a raised step or threshold.  Trim any bushes or trees on the path to your home.  Use bright outdoor lighting.  Clear any walking paths of anything that might make someone trip, such as rocks or tools.  Regularly check to see if  handrails are loose or broken. Make sure that both sides of any steps have handrails.  Any raised decks and porches should have guardrails on the edges.  Have any leaves, snow, or ice cleared regularly.  Use sand or salt on walking paths during winter.  Clean up any spills in your garage right away. This includes oil or grease spills. What can I do in the bathroom?  Use night lights.  Install grab bars by the toilet and in the tub and shower. Do not use towel bars as grab bars.  Use non-skid mats or decals in the tub or shower.  If you need to sit down in the shower, use a plastic, non-slip stool.  Keep the floor dry. Clean up any water that spills on the floor as soon as it happens.  Remove soap buildup in the tub or shower regularly.  Attach bath mats securely with double-sided non-slip rug tape.  Do not have throw rugs and other things on the floor that can make you trip. What can I do in the bedroom?  Use night lights.  Make sure that you have a light by your bed that is easy to reach.  Do not use any sheets or blankets that are too big for your bed. They should not hang down  onto the floor.  Have a firm chair that has side arms. You can use this for support while you get dressed.  Do not have throw rugs and other things on the floor that can make you trip. What can I do in the kitchen?  Clean up any spills right away.  Avoid walking on wet floors.  Keep items that you use a lot in easy-to-reach places.  If you need to reach something above you, use a strong step stool that has a grab bar.  Keep electrical cords out of the way.  Do not use floor polish or wax that makes floors slippery. If you must use wax, use non-skid floor wax.  Do not have throw rugs and other things on the floor that can make you trip. What can I do with my stairs?  Do not leave any items on the stairs.  Make sure that there are handrails on both sides of the stairs and use them. Fix  handrails that are broken or loose. Make sure that handrails are as long as the stairways.  Check any carpeting to make sure that it is firmly attached to the stairs. Fix any carpet that is loose or worn.  Avoid having throw rugs at the top or bottom of the stairs. If you do have throw rugs, attach them to the floor with carpet tape.  Make sure that you have a light switch at the top of the stairs and the bottom of the stairs. If you do not have them, ask someone to add them for you. What else can I do to help prevent falls?  Wear shoes that:  Do not have high heels.  Have rubber bottoms.  Are comfortable and fit you well.  Are closed at the toe. Do not wear sandals.  If you use a stepladder:  Make sure that it is fully opened. Do not climb a closed stepladder.  Make sure that both sides of the stepladder are locked into place.  Ask someone to hold it for you, if possible.  Clearly mark and make sure that you can see:  Any grab bars or handrails.  First and last steps.  Where the edge of each step is.  Use tools that help you move around (mobility aids) if they are needed. These include:  Canes.  Walkers.  Scooters.  Crutches.  Turn on the lights when you go into a dark area. Replace any light bulbs as soon as they burn out.  Set up your furniture so you have a clear path. Avoid moving your furniture around.  If any of your floors are uneven, fix them.  If there are any pets around you, be aware of where they are.  Review your medicines with your doctor. Some medicines can make you feel dizzy. This can increase your chance of falling. Ask your doctor what other things that you can do to help prevent falls. This information is not intended to replace advice given to you by your health care provider. Make sure you discuss any questions you have with your health care provider. Document Released: 07/17/2009 Document Revised: 02/26/2016 Document Reviewed:  10/25/2014 Elsevier Interactive Patient Education  2017 [employer].

## 2019-10-12 NOTE — Progress Notes (Signed)
This visit is being conducted via phone call due to the COVID-19 pandemic. This patient has given me verbal consent via phone to conduct this visit, patient states they are participating from their home address. Some vital signs may be absent or patient reported.   Patient identification: identified by name, DOB, and current address.  Location provider: [facility], Office Persons participating in the virtual visit: Todd Watson and Todd Forts, LPN.    Subjective:   Todd Watson is a 79 y.o. male who presents for Medicare Annual/Subsequent preventive examination.  He reports pain in left elbow and request something for pain today.  Review of Systems:  Cardiac Risk Factors include: advanced age (>24men, >60 women);diabetes mellitus;sedentary lifestyle;male gender    Objective:    Vitals: Ht 5\' 7"  (1.702 m)   Wt 160 lb (72.6 kg)   BMI 25.06 kg/m   Body mass index is 25.06 kg/m.  Advanced Directives 10/12/2019 09/29/2019 09/04/2018 08/09/2017  Does Patient Have a Medical Advance Directive? No No No No  Would patient like information on creating a medical advance directive? No - Patient declined Yes (ED - Information included in AVS) - Yes (MAU/Ambulatory/Procedural Areas - Information given)    Tobacco Social History   Tobacco Use  Smoking Status Never Smoker  Smokeless Tobacco Never Used     Counseling given: Not Answered   Clinical Intake:  Pre-visit preparation completed: Yes  Pain : No/denies pain     BMI - recorded: 25.06 Nutritional Status: BMI 25 -29 Overweight Nutritional Risks: None Diabetes: Yes CBG done?: No Did pt. bring in CBG monitor from home?: (N/A due to telephone visit)  How often do you need to have someone help you when you read instructions, pamphlets, or other written materials from your doctor or pharmacy?: 1 - Never What is the last grade level you completed in school?: 10th grade  Interpreter Needed?: No  Information entered  by :: Todd Forts, LPN.  Past Medical History:  Diagnosis Date  . Arthritis   . Diabetes type 2, controlled ([place])   . Diverticulitis   . GERD (gastroesophageal reflux disease)   . Herpes zoster   . Hiatal hernia   . History of kidney stones   . Hypothyroidism   . Osteoarthritis of lumbar spine    Past Surgical History:  Procedure Laterality Date  . APPENDECTOMY  2000  . INGUINAL HERNIA REPAIR Bilateral   . SPINE SURGERY     spurs   Family History  Problem Relation Age of Onset  . Heart disease Father        Valve disease  . Kidney failure Mother    Social History   Socioeconomic History  . Marital status: Married    Spouse name: Not on file  . Number of children: 3  . Years of education: 10  . Highest education level: GED or equivalent  Occupational History  . Occupation: retired  Tobacco Use  . Smoking status: Never Smoker  . Smokeless tobacco: Never Used  Substance and Sexual Activity  . Alcohol use: No  . Drug use: No  . Sexual activity: Yes  Other Topics Concern  . Not on file  Social History Narrative   Lives with wife.    3 children     6 grandchildren & 5 great grandchildren   Likes to fish    Social Determinants of Health   Financial Resource Strain:   . Difficulty of Paying Living Expenses: Not on file  Food Insecurity: No Food Insecurity  . Worried About Charity fundraiser in the Last Year: Never true  . Ran Out of Food in the Last Year: Never true  Transportation Needs: No Transportation Needs  . Lack of Transportation (Medical): No  . Lack of Transportation (Non-Medical): No  Physical Activity: Sufficiently Active  . Days of Exercise per Week: 5 days  . Minutes of Exercise per Session: 60 min  Stress: No Stress Concern Present  . Feeling of Stress : Only a little  Social Connections: Somewhat Isolated  . Frequency of Communication with Friends and Family: More than three times a week  . Frequency of Social Gatherings with Friends  and Family: Once a week  . Attends Religious Services: Never  . Active Member of Clubs or Organizations: No  . Attends Archivist Meetings: Not on file  . Marital Status: Married    Outpatient Encounter Medications as of 10/12/2019  Medication Sig  . aspirin 81 MG tablet Take 81 mg by mouth daily.  Marland Kitchen atorvastatin (LIPITOR) 20 MG tablet Take 20 mg by mouth daily.  Marland Kitchen glucosamine-chondroitin 500-400 MG tablet Take 1 tablet by mouth daily.  Marland Kitchen levothyroxine (SYNTHROID, LEVOTHROID) 125 MCG tablet Take 1 tablet (125 mcg total) by mouth daily before breakfast.  . lisinopril (PRINIVIL,ZESTRIL) 10 MG tablet Take 5 mg by mouth 2 (two) times daily.   . Multiple Vitamin (MULTIVITAMIN) tablet Take 1 tablet by mouth daily.  . naproxen sodium (ANAPROX) 220 MG tablet Take 220 mg by mouth as needed.  . Omega-3 Fatty Acids (FISH OIL) 1200 MG CAPS Take by mouth.  Marland Kitchen omeprazole (PRILOSEC) 40 MG capsule Take 40 mg by mouth daily.  . Probiotic Product (PROBIOTIC DAILY PO) Take by mouth.  . psyllium (METAMUCIL) 58.6 % powder Take 1 packet by mouth daily.  . sildenafil (VIAGRA) 100 MG tablet Take by mouth.  . vitamin C (ASCORBIC ACID) 500 MG tablet Take 500 mg by mouth daily.   No facility-administered encounter medications on file as of 10/12/2019.    Activities of Daily Living In your present state of health, do you have any difficulty performing the following activities: 10/12/2019  Hearing? N  Vision? N  Difficulty concentrating or making decisions? N  Walking or climbing stairs? N  Dressing or bathing? N  Doing errands, shopping? N  Preparing Food and eating ? N  Using the Toilet? N  In the past six months, have you accidently leaked urine? N  Do you have problems with loss of bowel control? N  Managing your Medications? N  Managing your Finances? N  Housekeeping or managing your Housekeeping? N  Some recent data might be hidden    Patient Care Team: [name], NP as PCP - General  (Family Medicine) Kary Kos, MD as Consulting Physician (Neurosurgery)   Assessment:   This is a routine wellness examination for Todd Watson.  Exercise Activities and Dietary recommendations Current Exercise Habits: The patient does not participate in regular exercise at present, Exercise limited by: None identified  Goals    . Patient Stated     Keep wife happy and stay healthy and active     . Patient Stated     Keep active and keep fishing       Fall Risk Fall Risk  10/12/2019 09/04/2018 08/09/2017 04/12/2017 07/31/2015  Falls in the past year? 0 0 No No No  Risk for fall due to : Medication side effect - - - -  Follow  up Falls evaluation completed;Education provided;Falls prevention discussed - - - -   Is the patient's home free of loose throw rugs in walkways, pet beds, electrical cords, etc?   yes      Grab bars in the bathroom? yes      Handrails on the stairs?   yes      Adequate lighting?   yes  Timed Get Up and Go Performed: N/A due to telephone visit  Depression Screen PHQ 2/9 Scores 09/04/2018 08/09/2017 04/12/2017 07/31/2015  PHQ - 2 Score 0 0 0 0    Cognitive Function MMSE - Mini Mental State Exam 09/04/2018  Not completed: (No Data)        Immunization History  Administered Date(s) Administered  . Influenza, High Dose Seasonal PF 07/31/2015, 07/04/2017  . Influenza,inj,Quad PF,6+ Mos 07/04/2014, 08/05/2019  . Influenza-Unspecified 08/21/2018  . Pneumococcal Conjugate-13 07/04/2014  . Zoster Recombinat (Shingrix) 04/03/2017, 07/04/2017    Qualifies for Shingles Vaccine? Patient has completed   Screening Tests Health Maintenance  Topic Date Due  . OPHTHALMOLOGY EXAM  03/10/2020 (Originally 09/04/2015)  . TETANUS/TDAP  03/10/2020 (Originally 11/11/1959)  . PNA vac Low Risk Adult (2 of 2 - PPSV23) 03/10/2020 (Originally 07/05/2015)  . HEMOGLOBIN A1C  03/20/2020  . FOOT EXAM  09/19/2020  . INFLUENZA VACCINE  Completed   Cancer Screenings: Lung: Low Dose CT  Chest recommended if Age 58-80 years, 30 pack-year currently smoking OR have quit w/in 15years. Patient does not qualify. Colorectal: Yes; up to date  Additional Screenings:  Hepatitis C Screening: N/A due to age      Plan:   Mr. Rummel states he was seen at [facility] 3 weeks ago for routine visit. We do not have documentation for Tdap or PPS-23 vaccines. I will ask patient to inquire about these from [facility]..   I have personally reviewed and noted the following in the patient's chart:   . Medical and social history . Use of alcohol, tobacco or illicit drugs  . Current medications and supplements . Functional ability and status . Nutritional status . Physical activity . Advanced directives . List of other physicians . Hospitalizations, surgeries, and ER visits in previous 12 months . Vitals . Screenings to include cognitive, depression, and falls . Referrals and appointments  In addition, I have reviewed and discussed with patient certain preventive protocols, quality metrics, and best practice recommendations. A written personalized care plan for preventive services as well as general preventive health recommendations were provided to patient.     Todd Forts, LPN  [id number]

## 2019-10-16 DIAGNOSIS — M5416 Radiculopathy, lumbar region: Secondary | ICD-10-CM | POA: Diagnosis not present

## 2019-10-16 DIAGNOSIS — Z6825 Body mass index (BMI) 25.0-25.9, adult: Secondary | ICD-10-CM | POA: Diagnosis not present

## 2019-10-16 DIAGNOSIS — I10 Essential (primary) hypertension: Secondary | ICD-10-CM | POA: Diagnosis not present

## 2019-11-08 DIAGNOSIS — M25522 Pain in left elbow: Secondary | ICD-10-CM | POA: Diagnosis not present

## 2019-11-08 DIAGNOSIS — M19022 Primary osteoarthritis, left elbow: Secondary | ICD-10-CM | POA: Diagnosis not present

## 2019-11-16 ENCOUNTER — Ambulatory Visit (INDEPENDENT_AMBULATORY_CARE_PROVIDER_SITE_OTHER): Payer: Medicare HMO | Admitting: Adult Health

## 2019-11-16 ENCOUNTER — Other Ambulatory Visit: Payer: Self-pay

## 2019-11-16 ENCOUNTER — Encounter: Payer: Self-pay | Admitting: Adult Health

## 2019-11-16 VITALS — BP 130/86 | HR 58 | Temp 97.2°F | Ht 67.0 in | Wt 165.0 lb

## 2019-11-16 DIAGNOSIS — M5416 Radiculopathy, lumbar region: Secondary | ICD-10-CM | POA: Diagnosis not present

## 2019-11-16 MED ORDER — METHYLPREDNISOLONE 4 MG PO TBPK: ORAL_TABLET | ORAL | 0 refills | Status: DC

## 2019-11-16 NOTE — Progress Notes (Signed)
Subjective:    Patient ID: Todd Watson, male    DOB: 02-15-1941, 79 y.o.   MRN: [id number]  HPI 79 year old male who  has a past medical history of Arthritis, Diabetes type 2, controlled ([place]), Diverticulitis, GERD (gastroesophageal reflux disease), Herpes zoster, Hiatal hernia, History of kidney stones, Hypothyroidism, and Osteoarthritis of lumbar spine.  He presents to the office today for left leg pain. Reports " I overdid it yesterday". He was throwing wood that he had split into the back of his pickup and states "I felt fine when I was doing it but a few hours later started to have some low back pain with a shooting pain that goes down my left leg.".  He has history of chronic low back pain with radiculopathy.  Feels similar to what he is experienced in the past.  Last night he did stretching exercises and sat in a warm bath and this morning he took Aleve, none of these modalities have helped.  Pain is worse when standing for long periods of time, change in positions, and twisting and bending   Review of Systems See HPI   Past Medical History:  Diagnosis Date  . Arthritis   . Diabetes type 2, controlled ([place])   . Diverticulitis   . GERD (gastroesophageal reflux disease)   . Herpes zoster   . Hiatal hernia   . History of kidney stones   . Hypothyroidism   . Osteoarthritis of lumbar spine     Social History   Socioeconomic History  . Marital status: Married    Spouse name: Not on file  . Number of children: 3  . Years of education: 10  . Highest education level: GED or equivalent  Occupational History  . Occupation: retired  Tobacco Use  . Smoking status: Never Smoker  . Smokeless tobacco: Never Used  Substance and Sexual Activity  . Alcohol use: No  . Drug use: No  . Sexual activity: Yes  Other Topics Concern  . Not on file  Social History Narrative   Lives with wife.    3 children     6 grandchildren & 5 great grandchildren   Likes to fish    Social  Determinants of Health   Financial Resource Strain:   . Difficulty of Paying Living Expenses: Not on file  Food Insecurity: No Food Insecurity  . Worried About Charity fundraiser in the Last Year: Never true  . Ran Out of Food in the Last Year: Never true  Transportation Needs: No Transportation Needs  . Lack of Transportation (Medical): No  . Lack of Transportation (Non-Medical): No  Physical Activity: Sufficiently Active  . Days of Exercise per Week: 5 days  . Minutes of Exercise per Session: 60 min  Stress: No Stress Concern Present  . Feeling of Stress : Only a little  Social Connections: Somewhat Isolated  . Frequency of Communication with Friends and Family: More than three times a week  . Frequency of Social Gatherings with Friends and Family: Once a week  . Attends Religious Services: Never  . Active Member of Clubs or Organizations: No  . Attends Archivist Meetings: Not on file  . Marital Status: Married  Human resources officer Violence:   . Fear of Current or Ex-Partner: Not on file  . Emotionally Abused: Not on file  . Physically Abused: Not on file  . Sexually Abused: Not on file    Past Surgical History:  Procedure Laterality Date  .  APPENDECTOMY  2000  . INGUINAL HERNIA REPAIR Bilateral   . SPINE SURGERY     spurs    Family History  Problem Relation Age of Onset  . Heart disease Father        Valve disease  . Kidney failure Mother     No Known Allergies  Current Outpatient Medications on File Prior to Visit  Medication Sig Dispense Refill  . aspirin 81 MG tablet Take 81 mg by mouth daily.    Marland Kitchen atorvastatin (LIPITOR) 20 MG tablet Take 20 mg by mouth daily.    Marland Kitchen glucosamine-chondroitin 500-400 MG tablet Take 1 tablet by mouth daily.    Marland Kitchen levothyroxine (SYNTHROID, LEVOTHROID) 125 MCG tablet Take 1 tablet (125 mcg total) by mouth daily before breakfast. 90 tablet 3  . lisinopril (PRINIVIL,ZESTRIL) 10 MG tablet Take 5 mg by mouth 2 (two) times daily.      . Multiple Vitamin (MULTIVITAMIN) tablet Take 1 tablet by mouth daily.    . naproxen sodium (ANAPROX) 220 MG tablet Take 220 mg by mouth as needed.    . Omega-3 Fatty Acids (FISH OIL) 1200 MG CAPS Take by mouth.    Marland Kitchen omeprazole (PRILOSEC) 40 MG capsule Take 40 mg by mouth daily.    . Probiotic Product (PROBIOTIC DAILY PO) Take by mouth.    . psyllium (METAMUCIL) 58.6 % powder Take 1 packet by mouth daily.    . sildenafil (VIAGRA) 100 MG tablet Take by mouth.    . vitamin C (ASCORBIC ACID) 500 MG tablet Take 500 mg by mouth daily.     No current facility-administered medications on file prior to visit.    BP 130/86   Pulse (!) 58   Temp (!) 97.2 F (36.2 C) (Other (Comment))   Ht 5\' 7"  (1.702 m)   Wt 165 lb (74.8 kg)   SpO2 96%   BMI 25.84 kg/m       Objective:   Physical Exam Vitals and nursing note reviewed.  Constitutional:      Appearance: Normal appearance.  Cardiovascular:     Rate and Rhythm: Normal rate and regular rhythm.     Pulses: Normal pulses.     Heart sounds: Normal heart sounds.  Pulmonary:     Effort: Pulmonary effort is normal.     Breath sounds: Normal breath sounds.  Musculoskeletal:        General: Tenderness present.     Comments: Long lateral aspect of left leg from hip to below the knee  Skin:    General: Skin is warm and dry.     Capillary Refill: Capillary refill takes less than 2 seconds.  Neurological:     General: No focal deficit present.     Mental Status: He is alert and oriented to person, place, and time.     Motor: No weakness.     Gait: Gait normal.     Deep Tendon Reflexes: Reflexes normal.  Psychiatric:        Mood and Affect: Mood normal.        Behavior: Behavior normal.        Thought Content: Thought content normal.        Judgment: Judgment normal.       Assessment & Plan:  1. Lumbar radiculopathy -Prescribed Medrol Dosepak.  He was advised to continue with Aleve, warm compress, and stretching exercises.   Follow-up if no improvement in the next 2 to 3 days - methylPREDNISolone (MEDROL DOSEPAK) 4 MG TBPK  tablet; Take as directed  Dispense: 21 tablet; Refill: 0   [name], NP

## 2019-11-21 ENCOUNTER — Telehealth: Payer: Self-pay | Admitting: Adult Health

## 2019-11-21 NOTE — Telephone Encounter (Signed)
If he is still in that much pain then I would follow up with his back doctor

## 2019-11-21 NOTE — Telephone Encounter (Signed)
Patient notified of update  and verbalized understanding. 

## 2019-11-21 NOTE — Telephone Encounter (Signed)
Pt has back pain going down to his legs making him unable to walk. He wants to know should he come in for more pain medication or see his back doctor.   Patient Phone: [phone]

## 2019-11-21 NOTE — Telephone Encounter (Signed)
Please advise 

## 2019-11-27 DIAGNOSIS — M5416 Radiculopathy, lumbar region: Secondary | ICD-10-CM | POA: Diagnosis not present

## 2019-12-12 DIAGNOSIS — M5432 Sciatica, left side: Secondary | ICD-10-CM | POA: Diagnosis not present

## 2019-12-12 DIAGNOSIS — M9903 Segmental and somatic dysfunction of lumbar region: Secondary | ICD-10-CM | POA: Diagnosis not present

## 2019-12-13 DIAGNOSIS — M9903 Segmental and somatic dysfunction of lumbar region: Secondary | ICD-10-CM | POA: Diagnosis not present

## 2019-12-13 DIAGNOSIS — M5432 Sciatica, left side: Secondary | ICD-10-CM | POA: Diagnosis not present

## 2019-12-17 DIAGNOSIS — M5432 Sciatica, left side: Secondary | ICD-10-CM | POA: Diagnosis not present

## 2019-12-17 DIAGNOSIS — M9903 Segmental and somatic dysfunction of lumbar region: Secondary | ICD-10-CM | POA: Diagnosis not present

## 2019-12-19 DIAGNOSIS — M9903 Segmental and somatic dysfunction of lumbar region: Secondary | ICD-10-CM | POA: Diagnosis not present

## 2019-12-19 DIAGNOSIS — M5432 Sciatica, left side: Secondary | ICD-10-CM | POA: Diagnosis not present

## 2019-12-20 DIAGNOSIS — M9903 Segmental and somatic dysfunction of lumbar region: Secondary | ICD-10-CM | POA: Diagnosis not present

## 2019-12-20 DIAGNOSIS — M5432 Sciatica, left side: Secondary | ICD-10-CM | POA: Diagnosis not present

## 2019-12-31 DIAGNOSIS — M48061 Spinal stenosis, lumbar region without neurogenic claudication: Secondary | ICD-10-CM | POA: Diagnosis not present

## 2019-12-31 DIAGNOSIS — M5416 Radiculopathy, lumbar region: Secondary | ICD-10-CM | POA: Diagnosis not present

## 2020-01-02 DIAGNOSIS — R351 Nocturia: Secondary | ICD-10-CM | POA: Diagnosis not present

## 2020-01-02 DIAGNOSIS — N5201 Erectile dysfunction due to arterial insufficiency: Secondary | ICD-10-CM | POA: Diagnosis not present

## 2020-01-02 DIAGNOSIS — Z125 Encounter for screening for malignant neoplasm of prostate: Secondary | ICD-10-CM | POA: Diagnosis not present

## 2020-01-02 DIAGNOSIS — N401 Enlarged prostate with lower urinary tract symptoms: Secondary | ICD-10-CM | POA: Diagnosis not present

## 2020-01-17 DIAGNOSIS — M5416 Radiculopathy, lumbar region: Secondary | ICD-10-CM | POA: Diagnosis not present

## 2020-01-20 DIAGNOSIS — M545 Low back pain: Secondary | ICD-10-CM | POA: Diagnosis not present

## 2020-01-22 DIAGNOSIS — M5416 Radiculopathy, lumbar region: Secondary | ICD-10-CM | POA: Diagnosis not present

## 2020-01-22 DIAGNOSIS — M5117 Intervertebral disc disorders with radiculopathy, lumbosacral region: Secondary | ICD-10-CM | POA: Diagnosis not present

## 2020-01-22 DIAGNOSIS — M48061 Spinal stenosis, lumbar region without neurogenic claudication: Secondary | ICD-10-CM | POA: Diagnosis not present

## 2020-01-22 DIAGNOSIS — M4726 Other spondylosis with radiculopathy, lumbar region: Secondary | ICD-10-CM | POA: Diagnosis not present

## 2020-01-24 ENCOUNTER — Other Ambulatory Visit: Payer: Self-pay | Admitting: Neurosurgery

## 2020-01-24 DIAGNOSIS — M5126 Other intervertebral disc displacement, lumbar region: Secondary | ICD-10-CM | POA: Diagnosis not present

## 2020-01-25 ENCOUNTER — Other Ambulatory Visit (HOSPITAL_COMMUNITY)
Admission: RE | Admit: 2020-01-25 | Discharge: 2020-01-25 | Disposition: A | Discharge status: Home / Self Care | Payer: Medicare HMO | Source: Ambulatory Visit | Attending: Neurosurgery | Admitting: Neurosurgery

## 2020-01-25 ENCOUNTER — Other Ambulatory Visit: Payer: Self-pay

## 2020-01-25 ENCOUNTER — Encounter (HOSPITAL_COMMUNITY): Payer: Self-pay | Admitting: Neurosurgery

## 2020-01-25 DIAGNOSIS — Z01812 Encounter for preprocedural laboratory examination: Secondary | ICD-10-CM | POA: Insufficient documentation

## 2020-01-25 DIAGNOSIS — Z20822 Contact with and (suspected) exposure to covid-19: Secondary | ICD-10-CM | POA: Diagnosis not present

## 2020-01-25 LAB — SARS CORONAVIRUS 2 (TAT 6-24 HRS): SARS Coronavirus 2: NEGATIVE

## 2020-01-25 NOTE — Progress Notes (Signed)
Spoke with pt's wife, [place] for pre-op call. DPR on file and patient was sleeping. [place] states pt does not have a cardiac history. States he's a pre-diabetic, states he does not check his blood sugar at home. She states that [employer] follows his pre-diabetes. Last A1C done by [facility] was December, 2019 and it was 6.6.   Covid test done today. [place] states pt understands that he needs to be in quarantine and stay in quarantine until he comes to the hospital on Monday.

## 2020-01-28 ENCOUNTER — Encounter (HOSPITAL_COMMUNITY): Payer: Self-pay | Admitting: Neurosurgery

## 2020-01-28 ENCOUNTER — Ambulatory Visit (HOSPITAL_COMMUNITY): Payer: Medicare HMO

## 2020-01-28 ENCOUNTER — Other Ambulatory Visit: Payer: Self-pay

## 2020-01-28 ENCOUNTER — Ambulatory Visit (HOSPITAL_COMMUNITY)
Admission: RE | Admit: 2020-01-28 | Discharge: 2020-01-29 | Disposition: A | Discharge status: Home / Self Care | Payer: Medicare HMO | Attending: Neurosurgery | Admitting: Neurosurgery

## 2020-01-28 ENCOUNTER — Encounter (HOSPITAL_COMMUNITY)
Admission: RE | Disposition: A | Discharge status: Home / Self Care | Payer: Self-pay | Source: Home / Self Care | Attending: Neurosurgery

## 2020-01-28 DIAGNOSIS — Z791 Long term (current) use of non-steroidal anti-inflammatories (NSAID): Secondary | ICD-10-CM | POA: Diagnosis not present

## 2020-01-28 DIAGNOSIS — K449 Diaphragmatic hernia without obstruction or gangrene: Secondary | ICD-10-CM | POA: Diagnosis not present

## 2020-01-28 DIAGNOSIS — Z8249 Family history of ischemic heart disease and other diseases of the circulatory system: Secondary | ICD-10-CM | POA: Diagnosis not present

## 2020-01-28 DIAGNOSIS — Z841 Family history of disorders of kidney and ureter: Secondary | ICD-10-CM | POA: Diagnosis not present

## 2020-01-28 DIAGNOSIS — E039 Hypothyroidism, unspecified: Secondary | ICD-10-CM | POA: Insufficient documentation

## 2020-01-28 DIAGNOSIS — Z7952 Long term (current) use of systemic steroids: Secondary | ICD-10-CM | POA: Diagnosis not present

## 2020-01-28 DIAGNOSIS — K219 Gastro-esophageal reflux disease without esophagitis: Secondary | ICD-10-CM | POA: Insufficient documentation

## 2020-01-28 DIAGNOSIS — I10 Essential (primary) hypertension: Secondary | ICD-10-CM | POA: Insufficient documentation

## 2020-01-28 DIAGNOSIS — G839 Paralytic syndrome, unspecified: Secondary | ICD-10-CM | POA: Insufficient documentation

## 2020-01-28 DIAGNOSIS — Z7982 Long term (current) use of aspirin: Secondary | ICD-10-CM | POA: Diagnosis not present

## 2020-01-28 DIAGNOSIS — M7138 Other bursal cyst, other site: Secondary | ICD-10-CM | POA: Diagnosis not present

## 2020-01-28 DIAGNOSIS — Z79899 Other long term (current) drug therapy: Secondary | ICD-10-CM | POA: Insufficient documentation

## 2020-01-28 DIAGNOSIS — M5126 Other intervertebral disc displacement, lumbar region: Secondary | ICD-10-CM | POA: Diagnosis not present

## 2020-01-28 DIAGNOSIS — M5116 Intervertebral disc disorders with radiculopathy, lumbar region: Secondary | ICD-10-CM | POA: Diagnosis not present

## 2020-01-28 DIAGNOSIS — M5136 Other intervertebral disc degeneration, lumbar region: Secondary | ICD-10-CM | POA: Diagnosis not present

## 2020-01-28 DIAGNOSIS — Z87442 Personal history of urinary calculi: Secondary | ICD-10-CM | POA: Insufficient documentation

## 2020-01-28 DIAGNOSIS — Z419 Encounter for procedure for purposes other than remedying health state, unspecified: Secondary | ICD-10-CM

## 2020-01-28 DIAGNOSIS — M1990 Unspecified osteoarthritis, unspecified site: Secondary | ICD-10-CM | POA: Diagnosis not present

## 2020-01-28 HISTORY — PX: LUMBAR LAMINECTOMY/DECOMPRESSION MICRODISCECTOMY: SHX5026

## 2020-01-28 LAB — BASIC METABOLIC PANEL
Anion gap: 14 (ref 5–15)
BUN: 18 mg/dL (ref 8–23)
CO2: 24 mmol/L (ref 22–32)
Calcium: 9.2 mg/dL (ref 8.9–10.3)
Chloride: 100 mmol/L (ref 98–111)
Creatinine, Ser: 0.81 mg/dL (ref 0.61–1.24)
GFR calc Af Amer: >60 mL/min (ref >60)
GFR calc non Af Amer: >60 mL/min (ref >60)
Glucose, Bld: 147 mg/dL — ABNORMAL HIGH (ref 70–99)
Potassium: 4.1 mmol/L (ref 3.5–5.1)
Sodium: 138 mmol/L (ref 135–145)

## 2020-01-28 LAB — HEMOGLOBIN A1C
Hgb A1c MFr Bld: 6.9 % — ABNORMAL HIGH (ref 4.8–5.6)
Mean Plasma Glucose: 151.33 mg/dL

## 2020-01-28 LAB — CBC
HCT: 47.7 % (ref 39.0–52.0)
Hemoglobin: 16.2 g/dL (ref 13.0–17.0)
MCH: 31.6 pg (ref 26.0–34.0)
MCHC: 34 g/dL (ref 30.0–36.0)
MCV: 93 fL (ref 80.0–100.0)
Platelets: 193 10*3/uL (ref 150–400)
RBC: 5.13 MIL/uL (ref 4.22–5.81)
RDW: 13.1 % (ref 11.5–15.5)
WBC: 8.9 10*3/uL (ref 4.0–10.5)
nRBC: 0 % (ref 0.0–0.2)

## 2020-01-28 LAB — GLUCOSE, CAPILLARY: Glucose-Capillary: 156 mg/dL — ABNORMAL HIGH (ref 70–99)

## 2020-01-28 SURGERY — LUMBAR LAMINECTOMY/DECOMPRESSION MICRODISCECTOMY 1 LEVEL
Anesthesia: General | Site: Back | Laterality: Right

## 2020-01-28 MED ORDER — CYCLOBENZAPRINE HCL 10 MG PO TABS
10.0000 mg | ORAL_TABLET | Freq: Three times a day (TID) | ORAL | Status: DC | PRN
  Administered 2020-01-29: 10 mg via ORAL
  Filled 2020-01-28: qty 1

## 2020-01-28 MED ORDER — PROPOFOL 10 MG/ML IV BOLUS
INTRAVENOUS | Status: DC | PRN
  Administered 2020-01-28: 100 mg via INTRAVENOUS

## 2020-01-28 MED ORDER — LEVOTHYROXINE SODIUM 88 MCG PO TABS
88.0000 ug | ORAL_TABLET | Freq: Every day | ORAL | Status: DC
  Administered 2020-01-29: 88 ug via ORAL
  Filled 2020-01-28: qty 1

## 2020-01-28 MED ORDER — ONDANSETRON HCL 4 MG/2ML IJ SOLN
INTRAMUSCULAR | Status: DC | PRN
  Administered 2020-01-28: 4 mg via INTRAVENOUS

## 2020-01-28 MED ORDER — ALUM & MAG HYDROXIDE-SIMETH 200-200-20 MG/5ML PO SUSP: 30.0000 mL | Freq: Four times a day (QID) | ORAL | Status: DC | PRN

## 2020-01-28 MED ORDER — MENTHOL 3 MG MT LOZG: 1.0000 | LOZENGE | OROMUCOSAL | Status: DC | PRN

## 2020-01-28 MED ORDER — FENTANYL CITRATE (PF) 100 MCG/2ML IJ SOLN
INTRAMUSCULAR | Status: AC
  Administered 2020-01-28: 100 ug via INTRAVENOUS
  Filled 2020-01-28: qty 2

## 2020-01-28 MED ORDER — OSTEO BI-FLEX ONE PER DAY PO TABS: ORAL_TABLET | Freq: Every day | ORAL | Status: DC

## 2020-01-28 MED ORDER — ACETAMINOPHEN 650 MG RE SUPP: 650.0000 mg | RECTAL | Status: DC | PRN

## 2020-01-28 MED ORDER — DEXAMETHASONE SODIUM PHOSPHATE 10 MG/ML IJ SOLN
INTRAMUSCULAR | Status: DC | PRN
  Administered 2020-01-28: 10 mg via INTRAVENOUS

## 2020-01-28 MED ORDER — ONDANSETRON HCL 4 MG/2ML IJ SOLN: 4.0000 mg | Freq: Four times a day (QID) | INTRAMUSCULAR | Status: DC | PRN

## 2020-01-28 MED ORDER — SUGAMMADEX SODIUM 200 MG/2ML IV SOLN
INTRAVENOUS | Status: DC | PRN
  Administered 2020-01-28: 150 mg via INTRAVENOUS

## 2020-01-28 MED ORDER — ONDANSETRON HCL 4 MG/2ML IJ SOLN
INTRAMUSCULAR | Status: AC
  Filled 2020-01-28: qty 2

## 2020-01-28 MED ORDER — NAPROXEN SODIUM 275 MG PO TABS
275.0000 mg | ORAL_TABLET | Freq: Every day | ORAL | Status: DC | PRN
  Filled 2020-01-28: qty 1

## 2020-01-28 MED ORDER — ACETAMINOPHEN 325 MG PO TABS: 650.0000 mg | ORAL_TABLET | ORAL | Status: DC | PRN

## 2020-01-28 MED ORDER — METHOCARBAMOL 500 MG PO TABS
500.0000 mg | ORAL_TABLET | Freq: Four times a day (QID) | ORAL | Status: DC
  Administered 2020-01-28 (×2): 500 mg via ORAL
  Filled 2020-01-28 (×2): qty 1

## 2020-01-28 MED ORDER — DEXAMETHASONE SODIUM PHOSPHATE 10 MG/ML IJ SOLN
INTRAMUSCULAR | Status: AC
  Filled 2020-01-28: qty 1

## 2020-01-28 MED ORDER — SILDENAFIL CITRATE 100 MG PO TABS: 100.0000 mg | ORAL_TABLET | ORAL | Status: DC | PRN

## 2020-01-28 MED ORDER — DICLOFENAC SODIUM 1 % EX GEL: 1.0000 "application " | Freq: Four times a day (QID) | CUTANEOUS | Status: DC | PRN

## 2020-01-28 MED ORDER — HYDROCODONE-ACETAMINOPHEN 5-325 MG PO TABS
1.0000 | ORAL_TABLET | Freq: Four times a day (QID) | ORAL | Status: DC | PRN
  Administered 2020-01-28: 1 via ORAL
  Filled 2020-01-28: qty 1

## 2020-01-28 MED ORDER — LISINOPRIL 5 MG PO TABS
5.0000 mg | ORAL_TABLET | Freq: Every day | ORAL | Status: DC
  Filled 2020-01-28: qty 1

## 2020-01-28 MED ORDER — LIDOCAINE 2% (20 MG/ML) 5 ML SYRINGE
INTRAMUSCULAR | Status: AC
  Filled 2020-01-28: qty 5

## 2020-01-28 MED ORDER — THROMBIN 20000 UNITS EX SOLR
CUTANEOUS | Status: AC
  Filled 2020-01-28: qty 20000

## 2020-01-28 MED ORDER — FENTANYL CITRATE (PF) 100 MCG/2ML IJ SOLN: 100.0000 ug | Freq: Once | INTRAMUSCULAR | Status: AC

## 2020-01-28 MED ORDER — 0.9 % SODIUM CHLORIDE (POUR BTL) OPTIME
TOPICAL | Status: DC | PRN
  Administered 2020-01-28: 1000 mL

## 2020-01-28 MED ORDER — SODIUM CHLORIDE 0.9% FLUSH
3.0000 mL | Freq: Two times a day (BID) | INTRAVENOUS | Status: DC
  Administered 2020-01-28: 3 mL via INTRAVENOUS

## 2020-01-28 MED ORDER — ACETAMINOPHEN 10 MG/ML IV SOLN
INTRAVENOUS | Status: AC
  Filled 2020-01-28: qty 100

## 2020-01-28 MED ORDER — CEFAZOLIN SODIUM-DEXTROSE 2-4 GM/100ML-% IV SOLN
2.0000 g | Freq: Three times a day (TID) | INTRAVENOUS | Status: AC
  Administered 2020-01-28 – 2020-01-29 (×2): 2 g via INTRAVENOUS
  Filled 2020-01-28 (×2): qty 100

## 2020-01-28 MED ORDER — BUPIVACAINE HCL (PF) 0.25 % IJ SOLN
INTRAMUSCULAR | Status: DC | PRN
  Administered 2020-01-28: 10 mL

## 2020-01-28 MED ORDER — LIDOCAINE-EPINEPHRINE 1 %-1:100000 IJ SOLN
INTRAMUSCULAR | Status: AC
  Filled 2020-01-28: qty 1

## 2020-01-28 MED ORDER — ONDANSETRON HCL 4 MG PO TABS: 4.0000 mg | ORAL_TABLET | Freq: Four times a day (QID) | ORAL | Status: DC | PRN

## 2020-01-28 MED ORDER — ASPIRIN 81 MG PO CHEW
81.0000 mg | CHEWABLE_TABLET | Freq: Every day | ORAL | Status: DC
  Administered 2020-01-28: 81 mg via ORAL
  Filled 2020-01-28: qty 1

## 2020-01-28 MED ORDER — HYDROMORPHONE HCL 1 MG/ML IJ SOLN
INTRAMUSCULAR | Status: AC
  Filled 2020-01-28: qty 1

## 2020-01-28 MED ORDER — SODIUM CHLORIDE 0.9% FLUSH: 3.0000 mL | INTRAVENOUS | Status: DC | PRN

## 2020-01-28 MED ORDER — THROMBIN 20000 UNITS EX SOLR
CUTANEOUS | Status: DC | PRN
  Administered 2020-01-28: 20 mL via TOPICAL

## 2020-01-28 MED ORDER — ASCORBIC ACID 500 MG PO TABS: 500.0000 mg | ORAL_TABLET | Freq: Every day | ORAL | Status: DC

## 2020-01-28 MED ORDER — HYDROMORPHONE HCL 1 MG/ML IJ SOLN
0.2500 mg | INTRAMUSCULAR | Status: DC | PRN
  Administered 2020-01-28: 0.25 mg via INTRAVENOUS
  Administered 2020-01-28: 0.5 mg via INTRAVENOUS
  Administered 2020-01-28: 17:00:00 0.25 mg via INTRAVENOUS

## 2020-01-28 MED ORDER — RISAQUAD PO CAPS
1.0000 | ORAL_CAPSULE | Freq: Every day | ORAL | Status: DC
  Filled 2020-01-28: qty 1

## 2020-01-28 MED ORDER — ADULT MULTIVITAMIN W/MINERALS CH: 1.0000 | ORAL_TABLET | Freq: Every day | ORAL | Status: DC

## 2020-01-28 MED ORDER — LIDOCAINE 2% (20 MG/ML) 5 ML SYRINGE
INTRAMUSCULAR | Status: DC | PRN
  Administered 2020-01-28: 60 mg via INTRAVENOUS

## 2020-01-28 MED ORDER — THROMBIN 5000 UNITS EX SOLR
CUTANEOUS | Status: AC
  Filled 2020-01-28: qty 5000

## 2020-01-28 MED ORDER — OMEGA-3-ACID ETHYL ESTERS 1 G PO CAPS
1000.0000 mg | ORAL_CAPSULE | Freq: Every day | ORAL | Status: DC
  Filled 2020-01-28: qty 1

## 2020-01-28 MED ORDER — CHLORHEXIDINE GLUCONATE CLOTH 2 % EX PADS: 6.0000 | MEDICATED_PAD | Freq: Once | CUTANEOUS | Status: DC

## 2020-01-28 MED ORDER — CEFAZOLIN SODIUM-DEXTROSE 2-4 GM/100ML-% IV SOLN
2.0000 g | INTRAVENOUS | Status: AC
  Administered 2020-01-28: 13:00:00 2 g via INTRAVENOUS

## 2020-01-28 MED ORDER — ATORVASTATIN CALCIUM 10 MG PO TABS
10.0000 mg | ORAL_TABLET | Freq: Every day | ORAL | Status: DC
  Administered 2020-01-28: 10 mg via ORAL
  Filled 2020-01-28: qty 1

## 2020-01-28 MED ORDER — DEXAMETHASONE SODIUM PHOSPHATE 10 MG/ML IJ SOLN: 10.0000 mg | Freq: Once | INTRAMUSCULAR | Status: DC

## 2020-01-28 MED ORDER — HYDROMORPHONE HCL 1 MG/ML IJ SOLN: 0.5000 mg | INTRAMUSCULAR | Status: DC | PRN

## 2020-01-28 MED ORDER — ACETAMINOPHEN 10 MG/ML IV SOLN
INTRAVENOUS | Status: DC | PRN
  Administered 2020-01-28: 1000 mg via INTRAVENOUS

## 2020-01-28 MED ORDER — TAMSULOSIN HCL 0.4 MG PO CAPS
0.4000 mg | ORAL_CAPSULE | Freq: Every day | ORAL | Status: DC
  Administered 2020-01-28: 0.4 mg via ORAL
  Filled 2020-01-28: qty 1

## 2020-01-28 MED ORDER — PSYLLIUM 95 % PO PACK
1.0000 | PACK | Freq: Every day | ORAL | Status: DC
  Filled 2020-01-28: qty 1

## 2020-01-28 MED ORDER — ACETAMINOPHEN 500 MG PO TABS
1000.0000 mg | ORAL_TABLET | Freq: Once | ORAL | Status: DC
  Filled 2020-01-28: qty 2

## 2020-01-28 MED ORDER — CEFAZOLIN SODIUM-DEXTROSE 2-4 GM/100ML-% IV SOLN
INTRAVENOUS | Status: AC
  Filled 2020-01-28: qty 100

## 2020-01-28 MED ORDER — ROCURONIUM BROMIDE 10 MG/ML (PF) SYRINGE
PREFILLED_SYRINGE | INTRAVENOUS | Status: AC
  Filled 2020-01-28: qty 10

## 2020-01-28 MED ORDER — PHENOL 1.4 % MT LIQD: 1.0000 | OROMUCOSAL | Status: DC | PRN

## 2020-01-28 MED ORDER — FENTANYL CITRATE (PF) 100 MCG/2ML IJ SOLN
INTRAMUSCULAR | Status: DC | PRN
  Administered 2020-01-28 (×2): 50 ug via INTRAVENOUS

## 2020-01-28 MED ORDER — SODIUM CHLORIDE 0.9 % IV SOLN
INTRAVENOUS | Status: DC | PRN
  Administered 2020-01-28: 500 mL

## 2020-01-28 MED ORDER — FENTANYL CITRATE (PF) 250 MCG/5ML IJ SOLN
INTRAMUSCULAR | Status: AC
  Filled 2020-01-28: qty 5

## 2020-01-28 MED ORDER — SODIUM CHLORIDE 0.9 % IV SOLN
250.0000 mL | INTRAVENOUS | Status: DC
  Administered 2020-01-28: 250 mL via INTRAVENOUS

## 2020-01-28 MED ORDER — PANTOPRAZOLE SODIUM 40 MG PO TBEC
40.0000 mg | DELAYED_RELEASE_TABLET | Freq: Every day | ORAL | Status: DC
  Administered 2020-01-28: 40 mg via ORAL
  Filled 2020-01-28: qty 1

## 2020-01-28 MED ORDER — BUPIVACAINE HCL (PF) 0.25 % IJ SOLN
INTRAMUSCULAR | Status: AC
  Filled 2020-01-28: qty 30

## 2020-01-28 MED ORDER — LACTATED RINGERS IV SOLN: INTRAVENOUS | Status: DC

## 2020-01-28 MED ORDER — OXYCODONE HCL 5 MG PO TABS
10.0000 mg | ORAL_TABLET | ORAL | Status: DC | PRN
  Administered 2020-01-29: 10 mg via ORAL
  Filled 2020-01-28: qty 2

## 2020-01-28 MED ORDER — ROCURONIUM BROMIDE 50 MG/5ML IV SOSY
PREFILLED_SYRINGE | INTRAVENOUS | Status: DC | PRN
  Administered 2020-01-28: 50 mg via INTRAVENOUS

## 2020-01-28 MED ORDER — PANTOPRAZOLE SODIUM 40 MG IV SOLR: 40.0000 mg | Freq: Every day | INTRAVENOUS | Status: DC

## 2020-01-28 MED ORDER — LIDOCAINE-EPINEPHRINE 1 %-1:100000 IJ SOLN
INTRAMUSCULAR | Status: DC | PRN
  Administered 2020-01-28: 10 mL

## 2020-01-28 SURGICAL SUPPLY — 61 items
ADH SKN CLS APL DERMABOND .7 (GAUZE/BANDAGES/DRESSINGS) ×1
APL SKNCLS STERI-STRIP NONHPOA (GAUZE/BANDAGES/DRESSINGS) ×1
BAG DECANTER FOR FLEXI CONT (MISCELLANEOUS) ×2 IMPLANT
BENZOIN TINCTURE PRP APPL 2/3 (GAUZE/BANDAGES/DRESSINGS) ×2 IMPLANT
BLADE CLIPPER SURG (BLADE) IMPLANT
BLADE SURG 11 STRL SS (BLADE) ×2 IMPLANT
BUR CUTTER 7.0 ROUND (BURR) ×2 IMPLANT
BUR MATCHSTICK NEURO 3.0 LAGG (BURR) ×2 IMPLANT
CANISTER SUCT 3000ML PPV (MISCELLANEOUS) ×2 IMPLANT
CARTRIDGE OIL MAESTRO DRILL (MISCELLANEOUS) ×1 IMPLANT
COVER WAND RF STERILE (DRAPES) ×2 IMPLANT
DECANTER SPIKE VIAL GLASS SM (MISCELLANEOUS) ×2 IMPLANT
DERMABOND ADVANCED (GAUZE/BANDAGES/DRESSINGS) ×1
DERMABOND ADVANCED .7 DNX12 (GAUZE/BANDAGES/DRESSINGS) ×1 IMPLANT
DIFFUSER DRILL AIR PNEUMATIC (MISCELLANEOUS) ×2 IMPLANT
DRAPE HALF SHEET 40X57 (DRAPES) IMPLANT
DRAPE LAPAROTOMY 100X72X124 (DRAPES) ×2 IMPLANT
DRAPE MICROSCOPE LEICA (MISCELLANEOUS) ×2 IMPLANT
DRAPE SURG 17X23 STRL (DRAPES) ×2 IMPLANT
DRSG OPSITE POSTOP 4X6 (GAUZE/BANDAGES/DRESSINGS) ×1 IMPLANT
DURAPREP 26ML APPLICATOR (WOUND CARE) ×2 IMPLANT
ELECT REM PT RETURN 9FT ADLT (ELECTROSURGICAL) ×2
ELECTRODE REM PT RTRN 9FT ADLT (ELECTROSURGICAL) ×1 IMPLANT
GAUZE 4X4 16PLY RFD (DISPOSABLE) IMPLANT
GAUZE SPONGE 4X4 12PLY STRL (GAUZE/BANDAGES/DRESSINGS) ×2 IMPLANT
GLOVE BIO SURGEON STRL SZ 6.5 (GLOVE) ×1 IMPLANT
GLOVE BIO SURGEON STRL SZ7 (GLOVE) ×1 IMPLANT
GLOVE BIO SURGEON STRL SZ8 (GLOVE) ×2 IMPLANT
GLOVE BIOGEL PI IND STRL 6.5 (GLOVE) IMPLANT
GLOVE BIOGEL PI IND STRL 7.0 (GLOVE) IMPLANT
GLOVE BIOGEL PI IND STRL 7.5 (GLOVE) ×1 IMPLANT
GLOVE BIOGEL PI INDICATOR 6.5 (GLOVE) ×1
GLOVE BIOGEL PI INDICATOR 7.0 (GLOVE) ×1
GLOVE BIOGEL PI INDICATOR 7.5 (GLOVE) ×1
GLOVE ECLIPSE 7.5 STRL STRAW (GLOVE) ×4 IMPLANT
GLOVE EXAM NITRILE XL STR (GLOVE) IMPLANT
GLOVE INDICATOR 8.5 STRL (GLOVE) ×2 IMPLANT
GOWN STRL REUS W/ TWL LRG LVL3 (GOWN DISPOSABLE) ×1 IMPLANT
GOWN STRL REUS W/ TWL XL LVL3 (GOWN DISPOSABLE) ×2 IMPLANT
GOWN STRL REUS W/TWL 2XL LVL3 (GOWN DISPOSABLE) ×1 IMPLANT
GOWN STRL REUS W/TWL LRG LVL3 (GOWN DISPOSABLE) ×4
GOWN STRL REUS W/TWL XL LVL3 (GOWN DISPOSABLE) ×2
KIT BASIN OR (CUSTOM PROCEDURE TRAY) ×2 IMPLANT
KIT TURNOVER KIT B (KITS) ×2 IMPLANT
NDL SPNL 22GX3.5 QUINCKE BK (NEEDLE) ×1 IMPLANT
NEEDLE HYPO 22GX1.5 SAFETY (NEEDLE) ×2 IMPLANT
NEEDLE SPNL 22GX3.5 QUINCKE BK (NEEDLE) ×2 IMPLANT
NS IRRIG 1000ML POUR BTL (IV SOLUTION) ×2 IMPLANT
OIL CARTRIDGE MAESTRO DRILL (MISCELLANEOUS) ×2
PACK LAMINECTOMY NEURO (CUSTOM PROCEDURE TRAY) ×2 IMPLANT
RUBBERBAND STERILE (MISCELLANEOUS) ×4 IMPLANT
SPONGE SURGIFOAM ABS GEL 100 (HEMOSTASIS) ×1 IMPLANT
SPONGE SURGIFOAM ABS GEL SZ50 (HEMOSTASIS) ×1 IMPLANT
STRIP CLOSURE SKIN 1/2X4 (GAUZE/BANDAGES/DRESSINGS) ×2 IMPLANT
SUT VIC AB 0 CT1 18XCR BRD8 (SUTURE) ×1 IMPLANT
SUT VIC AB 0 CT1 8-18 (SUTURE) ×2
SUT VIC AB 2-0 CT1 18 (SUTURE) ×2 IMPLANT
SUT VICRYL 4-0 PS2 18IN ABS (SUTURE) ×2 IMPLANT
TOWEL GREEN STERILE (TOWEL DISPOSABLE) ×2 IMPLANT
TOWEL GREEN STERILE FF (TOWEL DISPOSABLE) ×2 IMPLANT
WATER STERILE IRR 1000ML POUR (IV SOLUTION) ×2 IMPLANT

## 2020-01-28 NOTE — Anesthesia Procedure Notes (Signed)
Procedure Name: Intubation Date/Time: 01/28/2020 1:14 PM Performed by: Glynda Jaeger, CRNA Pre-anesthesia Checklist: Patient identified, Emergency Drugs available, Suction available and Patient being monitored Patient Re-evaluated:Patient Re-evaluated prior to induction Oxygen Delivery Method: Circle System Utilized Preoxygenation: Pre-oxygenation with 100% oxygen Induction Type: IV induction Ventilation: Mask ventilation without difficulty Laryngoscope Size: Miller and 2 Grade View: Grade I Tube type: Oral Tube size: 7.5 mm Number of attempts: 1 Airway Equipment and Method: Stylet and Oral airway Placement Confirmation: ETT inserted through vocal cords under direct vision,  positive ETCO2 and breath sounds checked- equal and bilateral Secured at: 23 cm Tube secured with: Tape Dental Injury: Teeth and Oropharynx as per pre-operative assessment  Comments: [place] performed

## 2020-01-28 NOTE — Transfer of Care (Signed)
Immediate Anesthesia Transfer of Care Note  Patient: Todd Watson  Procedure(s) Performed: Microdiscectomy - right - Lumbar four-Lumbar five (Right Back)  Patient Location: PACU  Anesthesia Type:General  Level of Consciousness: oriented and patient cooperative  Airway & Oxygen Therapy: Patient Spontanous Breathing and Patient connected to nasal cannula oxygen  Post-op Assessment: Report given to RN and Post -op Vital signs reviewed and stable  Post vital signs: Reviewed  Last Vitals:  Vitals Value Taken Time  BP 181/90 01/28/20 1522  Temp    Pulse 64 01/28/20 1523  Resp 13 01/28/20 1523  SpO2 98 % 01/28/20 1523  Vitals shown include unvalidated device data.  Last Pain:  Vitals:   01/28/20 1252  TempSrc:   PainSc: 5       Patients Stated Pain Goal: 3 ([id number])  Complications: No apparent anesthesia complications

## 2020-01-28 NOTE — H&P (Signed)
Todd Watson is an 79 y.o. male.   Chief Complaint: Back and right leg pain HPI: 79 year old gentleman presented with back severe right leg pain that been getting progressively worse over the last 3 to 4 weeks.  Patient failed 2 rounds of prednisone.  Work-up revealed very large disclamation possible synovial cyst on the right at L4-5 with a superior fragment displacing the right L4 nerve against the right L4 pedicle.  Due to patient's progression of clinical syndrome imaging findings failed conservative treatment I recommended laminectomy microdiscectomy resection of possible synovial cyst on the right at L4-5 I extensively went over the risks and benefits of the operation with him as well as perioperative course expectations of outcome and alternatives to surgery and he understood and agreed to proceed forward.  Past Medical History:  Diagnosis Date  . Arthritis   . Diverticulitis   . GERD (gastroesophageal reflux disease)   . Herpes zoster   . Hiatal hernia   . History of kidney stones   . Hypertension   . Hypothyroidism   . Osteoarthritis of lumbar spine   . Paralysis ([place])   . Pneumonia     Past Surgical History:  Procedure Laterality Date  . APPENDECTOMY  2000  . INGUINAL HERNIA REPAIR Bilateral   . SPINE SURGERY     spurs    Family History  Problem Relation Age of Onset  . Heart disease Father        Valve disease  . Kidney failure Mother    Social History:  reports that he has never smoked. He has never used smokeless tobacco. He reports that he does not drink alcohol or use drugs.  Allergies: No Known Allergies  Medications Prior to Admission  Medication Sig Dispense Refill  . aspirin 81 MG tablet Take 81 mg by mouth daily.    Marland Kitchen atorvastatin (LIPITOR) 10 MG tablet Take 10 mg by mouth at bedtime.    . [place]-Glucosamine-Vit D (OSTEO BI-FLEX ONE PER DAY PO) Take 1 tablet by mouth daily.    . diclofenac Sodium (VOLTAREN) 1 % GEL Apply 1 application topically 4  (four) times daily as needed (pain).    . DM-Doxylamine-Acetaminophen (NIGHT-TIME COLD/FLU RELIEF PO) Take 1 tablet by mouth at bedtime.    Marland Kitchen HYDROcodone-acetaminophen (NORCO/VICODIN) 5-325 MG tablet Take 1 tablet by mouth every 6 (six) hours as needed for moderate pain.    Marland Kitchen levothyroxine (SYNTHROID) 88 MCG tablet Take 88 mcg by mouth daily before breakfast.    . lisinopril (PRINIVIL,ZESTRIL) 10 MG tablet Take 5 mg by mouth in the morning and at bedtime.     . methocarbamol (ROBAXIN) 500 MG tablet Take 500 mg by mouth 4 (four) times daily.    . methylPREDNISolone (MEDROL DOSEPAK) 4 MG TBPK tablet Take as directed (Patient taking differently: Take 4-24 mg by mouth See admin instructions. (Typical regimens for 21 tablet dose packs of Methylprednisolone 4mg , Prednisone 5mg , and Prednisone 10mg ) Day 1: 2 tabs before breakfast, 1 tab after lunch, 1 tab after supper, and 2 tabs at bedtime. Day 2: 1 tab before breakfast, 1 tab after lunch, 1 tab after supper, and 2 tabs at bedtime. Day 3: 1 tab before breakfast, 1 tab after lunch, 1 tab after supper, and 1 tab at bedtime. Day 4: 1 tab before breakfast, 1 tab after lunch, and 1 tab at bedtime. Day 5: 1 tab before breakfast and 1 tab at bedtime. Day 6: 1 tab before breakfast.) 21 tablet 0  . Multiple  Vitamin (MULTIVITAMIN) tablet Take 1 tablet by mouth daily.    . naproxen sodium (ANAPROX) 220 MG tablet Take 220 mg by mouth daily as needed (pain).     . Omega-3 1000 MG CAPS Take 1,000 mg by mouth daily.    Marland Kitchen omeprazole (PRILOSEC) 20 MG capsule Take 20 mg by mouth in the morning and at bedtime.    . Probiotic Product (PROBIOTIC DAILY PO) Take 1 capsule by mouth daily.     . psyllium (METAMUCIL) 58.6 % powder Take 1 packet by mouth daily as needed (constipation).     . sildenafil (VIAGRA) 100 MG tablet Take 100 mg by mouth as needed for erectile dysfunction.     . tamsulosin (FLOMAX) 0.4 MG CAPS capsule Take 0.4 mg by mouth at bedtime.    . vitamin C  (ASCORBIC ACID) 500 MG tablet Take 500 mg by mouth daily.      No results found for this or any previous visit (from the past 48 hour(s)). No results found.  Review of Systems  Musculoskeletal: Positive for back pain.  Neurological: Positive for weakness and numbness.    There were no vitals taken for this visit. Physical Exam  Constitutional: He is oriented to person, place, and time. He appears well-developed.  HENT:  Head: Normocephalic.  Eyes: Pupils are equal, round, and reactive to light.  Cardiovascular: Normal rate.  Respiratory: Effort normal.  GI: Soft. Bowel sounds are normal.  Musculoskeletal:        General: Normal range of motion.     Cervical back: Normal range of motion.  Neurological: He is alert and oriented to person, place, and time. He has normal strength. GCS eye subscore is 4. GCS verbal subscore is 5. GCS motor subscore is 6.  Strength 5-5 iliopsoas, quads, hamstrings, gastroc, and tibialis, and EHL on the left slight quadricep weakness and dorsiflexion weakness on the right  Skin: Skin is warm and dry.     Assessment/Plan 79 year old presents for right-sided laminectomy for discectomy possible resection of synovial cyst.  Mechell Girgis P, MD 01/28/2020, 12:05 PM

## 2020-01-28 NOTE — Anesthesia Postprocedure Evaluation (Signed)
Anesthesia Post Note  Patient: Todd Watson  Procedure(s) Performed: Microdiscectomy - right - Lumbar four-Lumbar five (Right Back)     Patient location during evaluation: PACU Anesthesia Type: General Level of consciousness: awake and alert Pain management: pain level controlled Vital Signs Assessment: post-procedure vital signs reviewed and stable Respiratory status: spontaneous breathing, nonlabored ventilation and respiratory function stable Cardiovascular status: blood pressure returned to baseline and stable Postop Assessment: no apparent nausea or vomiting Anesthetic complications: no    Last Vitals:  Vitals:   01/28/20 1651 01/28/20 1706  BP: (!) 189/88 (!) 144/78  Pulse: (!) 56 (!) 58  Resp: 15 12  Temp:    SpO2: 98% 94%    Last Pain:  Vitals:   01/28/20 1706  TempSrc:   PainSc: 5                  Deliliah Spranger,W. EDMOND

## 2020-01-28 NOTE — Anesthesia Preprocedure Evaluation (Addendum)
Anesthesia Evaluation  Patient identified by MRN, date of birth, ID band Patient awake    Reviewed: Allergy & Precautions, H&P , NPO status , Patient's Chart, lab work & pertinent test results  Airway Mallampati: II  TM Distance: >3 FB Neck ROM: Full    Dental no notable dental hx. (+) Edentulous Lower, Dental Advisory Given   Pulmonary neg pulmonary ROS,    Pulmonary exam normal breath sounds clear to auscultation       Cardiovascular hypertension, Pt. on medications  Rhythm:Regular Rate:Normal     Neuro/Psych negative neurological ROS  negative psych ROS   GI/Hepatic Neg liver ROS, hiatal hernia, GERD  Medicated and Controlled,  Endo/Other  Hypothyroidism   Renal/GU negative Renal ROS  negative genitourinary   Musculoskeletal  (+) Arthritis , Osteoarthritis,    Abdominal   Peds  Hematology negative hematology ROS (+)   Anesthesia Other Findings   Reproductive/Obstetrics negative OB ROS                            Anesthesia Physical Anesthesia Plan  ASA: II  Anesthesia Plan: General   Post-op Pain Management:    Induction: Intravenous  PONV Risk Score and Plan: 3 and Ondansetron, Dexamethasone and Treatment may vary due to age or medical condition  Airway Management Planned: Oral ETT  Additional Equipment:   Intra-op Plan:   Post-operative Plan: Extubation in OR  Informed Consent: I have reviewed the patients History and Physical, chart, labs and discussed the procedure including the risks, benefits and alternatives for the proposed anesthesia with the patient or authorized representative who has indicated his/her understanding and acceptance.     Dental advisory given  Plan Discussed with: CRNA  Anesthesia Plan Comments:         Anesthesia Quick Evaluation

## 2020-01-28 NOTE — Op Note (Signed)
Preoperative diagnosis: Right L4-L5 radiculopathy from synovial cyst and ruptured disc L4-5 right  Postoperative diagnosis: Same  Procedure: Right-sided lumbar laminectomy partial medial facetectomy and foraminotomy for resection of synovial cyst and microscopic discectomy with microdissection of the right L4 and L5 nerve root microscopic discectomy  Surgeon: Dominica Severin Maxten Shuler  Assistant: Nash Shearer  Anesthesia: General  EBL: Minimal  HPI: 79 year old gentleman presented with acute right-sided back and leg pain with weakness in dorsiflexion of his right foot.  Work-up revealed synovial cyst and disc herniation L4-5 on the right.  Due to patient progression of clinical syndrome imaging findings and failed conservative treatment I recommended laminectomy microdiscectomy as well as foraminotomies with resection of possible synovial cyst I extensively went over the risks and benefits of that operation with him as well as perioperative course expectations of outcome and alternatives of surgery and he understood and agreed to proceed forward.  Operative procedure: Patient brought into the OR was due to general esthesia positioned prone Wilson frame his back was prepped and draped in routine sterile fashion he had no incision of the L5-S1 so I drew out and opened up an incision slightly superior this after infiltration of 10 cc lidocaine with epi and a midline incision was made.  Scar tissue and subperiosteal dissection was carried out exposing the lamina of L4 and L5 confirmed by intraoperative x-ray.  Then utilizing high-speed drill the inferior aspect of the inferior two thirds of the lamina of L4 was removed medial facet complex and superior aspect of lamina L5 laminotomy was begun with a 2 intermittent Kerrison punch.  Immediately identified the ligament flavum which is markedly hypertrophied I teased it off the dura and encountered a large synovial cyst.  At this point the operating microscope was draped  and brought into the field and microscope illumination I dissected the cyst off of the lateral dural wall which was noted markedly adherent and stuck.  After underbite of the medial facet facet and count of the lip to sizing the adherent cyst wall and then dissected off the dura freeing up the L5 nerve root in the L5 foramen.  Then then 5 the disc base marching superiorly I found several large free fragments of disc that have migrated superiorly compressing the thecal sac and undersurface of the L4 nerve against the L4 pedicle.  Teased all these way with a nerve hook and coronary dilator extended the laminotomy up superiorly and laterally to identify the takeoff of the L4 nerve root.  Working underneath the L4 nerve root removed several large fragments of this.  Inspected the disc base did not see an overt annular rent I elected not to cut into it.  Passed a coronary dilator easily out the 4 foramen of the 5 foramen underneath the thecal sac medially superiorly and caudal.  After adequate decompression been achieved the wound was copiously irrigated meticulous hemostasis was maintained Gelfoam was overlaid on top of the dura the muscle fascia reapproximated layers with Vicryl in a running 4-0 subcuticular and skin dermal benzoin Steri-Strips and a sterile dressing was applied patient recovery in stable condition.  At the end of the case all needle counts and sponge counts were correct.

## 2020-01-29 DIAGNOSIS — M5116 Intervertebral disc disorders with radiculopathy, lumbar region: Secondary | ICD-10-CM | POA: Diagnosis not present

## 2020-01-29 MED ORDER — METHOCARBAMOL 500 MG PO TABS: 500.0000 mg | ORAL_TABLET | Freq: Four times a day (QID) | ORAL | 0 refills | Status: DC

## 2020-01-29 MED ORDER — HYDROCODONE-ACETAMINOPHEN 5-325 MG PO TABS: 1.0000 | ORAL_TABLET | ORAL | 0 refills | Status: DC | PRN

## 2020-01-29 NOTE — Plan of Care (Signed)
Patient alert and oriented, mae's well, voiding adequate amount of urine, swallowing without difficulty, no c/o pain at time of discharge. Patient discharged home with family. Script and discharged instructions given to patient. Patient and family stated understanding of instructions given. Patient has an appointment with Dr. Cram 

## 2020-01-29 NOTE — Evaluation (Signed)
Physical Therapy Evaluation Patient Details Name: Todd Watson MRN: [id number] DOB: [birth year] Today's Date: 01/29/2020   History of Present Illness  79 yo male s/p L4-5 lami. PMH including arthritis, HTN, and OA at spine.   Clinical Impression  Patient presents with pain and post surgical deficits s/p above surgery. Pt reports being mod I using RW recently and living with wife PTA. Pt loves working outdoors and riding his [place]. Today, pt tolerated bed mobility, transfers and gait training with Min guard-supervision for safety with some mild drifting but no overt LOB. Needs max cues to adhere to back precautions during functional mobility. Education re: back precautions, log roll technique, positioning, walking program, car transfer etc. Pt reports he may stay with his daughter at d/c as he and his wife cannot drive at this time. Will follow acutely to maximize independence and mobility prior to return home.    Follow Up Recommendations Home health PT;Supervision - Intermittent    Equipment Recommendations  None recommended by PT    Recommendations for Other Services       Precautions / Restrictions Precautions Precautions: Back Precaution Booklet Issued: Yes (comment) Precaution Comments: Reviewed back precautions and handout Required Braces or Orthoses: Other Brace Other Brace: No brace per MD order Restrictions Weight Bearing Restrictions: No      Mobility  Bed Mobility Overal bed mobility: Needs Assistance Bed Mobility: Rolling;Sidelying to Sit Rolling: Min guard Sidelying to sit: Min guard;HOB elevated       General bed mobility comments: Cues for log roll technique to get to EOB, use of rail and increased time. Pt wanting to push self up into sitting despite cues.  Transfers Overall transfer level: Needs assistance Equipment used: None Transfers: Sit to/from Stand Sit to Stand: Supervision         General transfer comment: supervision for  safety  Ambulation/Gait Ambulation/Gait assistance: Min guard Gait Distance (Feet): 400 Feet Assistive device: None Gait Pattern/deviations: Step-through pattern;Decreased stride length;Drifts right/left   Gait velocity interpretation: 1.31 - 2.62 ft/sec, indicative of limited community ambulator General Gait Details: Slow, mostly steady gait with some drifting noted. No overt LOB.  Stairs            Wheelchair Mobility    Modified Rankin (Stroke Patients Only)       Balance Overall balance assessment: Needs assistance Sitting-balance support: Feet supported;No upper extremity supported Sitting balance-Leahy Scale: Good Sitting balance - Comments: Able to pull up socks sitting EOB wtihout difficulty.   Standing balance support: During functional activity Standing balance-Leahy Scale: Fair Standing balance comment: Supervision for safety.                             Pertinent Vitals/Pain Pain Assessment: Faces Faces Pain Scale: Hurts little more Pain Location: Back Pain Descriptors / Indicators: Sore Pain Intervention(s): Repositioned;Monitored during session    Home Living Family/patient expects to be discharged to:: Private residence Living Arrangements: Spouse/significant other Available Help at Discharge: Family;Available 24 hours/day;Friend(s) Type of Home: House Home Access: Ramped entrance     Home Layout: One level;Laundry or work area in Armed forces training and education officer to basement) Home Equipment: Tub bench;Walker - 2 wheels;Adaptive equipment      Prior Function Level of Independence: Independent with assistive device(s)         Comments: Works outside a lot, 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 no falls reported. Drives. Has been uses RW recently due to pain. Reports he eats out at [employer]  a lot. Wife had surgery on her foot and wearing a boot so both cannot drive.     Hand Dominance   Dominant Hand: Right    Extremity/Trunk Assessment   Upper Extremity  Assessment Upper Extremity Assessment: Defer to OT evaluation    Lower Extremity Assessment Lower Extremity Assessment: Overall WFL for tasks assessed(sensation WFL BLEs)    Cervical / Trunk Assessment Cervical / Trunk Assessment: Other exceptions Cervical / Trunk Exceptions: s/p back sx  Communication   Communication: No difficulties  Cognition Arousal/Alertness: Awake/alert Behavior During Therapy: WFL for tasks assessed/performed Overall Cognitive Status: Impaired/Different from baseline Area of Impairment: Safety/judgement;Memory                     Memory: Decreased recall of precautions;Decreased short-term memory   Safety/Judgement: Decreased awareness of deficits     General Comments: Easily distracted and tangential in speech; needs constant reminders to adhere to back precautions during functional mobility. Likely at baseline      General Comments General comments (skin integrity, edema, etc.): Discussed asking daughter if she can help with meals as pt and pt's wife cannot drive.    Exercises     Assessment/Plan    PT Assessment Patient needs continued PT services  PT Problem List Decreased mobility;Decreased safety awareness;Decreased cognition;Decreased knowledge of precautions;Decreased balance;Pain;Decreased skin integrity       PT Treatment Interventions Therapeutic activities;Gait training;Therapeutic exercise;Patient/family education;Balance training;Functional mobility training    PT Goals (Current goals can be found in the Care Plan section)  Acute Rehab PT Goals Patient Stated Goal: to get back to riding my [place] PT Goal Formulation: With patient Time For Goal Achievement: 02/12/20 Potential to Achieve Goals: Good    Frequency Min 5X/week   Barriers to discharge Decreased caregiver support      Co-evaluation               AM-PAC PT "6 Clicks" Mobility  Outcome Measure Help needed turning from your back to your side while in  a flat bed without using bedrails?: None Help needed moving from lying on your back to sitting on the side of a flat bed without using bedrails?: A Little Help needed moving to and from a bed to a chair (including a wheelchair)?: A Little Help needed standing up from a chair using your arms (e.g., wheelchair or bedside chair)?: A Little Help needed to walk in hospital room?: A Little Help needed climbing 3-5 steps with a railing? : A Little 6 Click Score: 19    End of Session Equipment Utilized During Treatment: Gait belt Activity Tolerance: Patient tolerated treatment well Patient left: in bed;with call bell/phone within reach(sitting EOB eating breakfast) Nurse Communication: Mobility status PT Visit Diagnosis: Pain;Unsteadiness on feet (R26.81) Pain - part of body: (back)    Time: [id number] PT Time Calculation (min) (ACUTE ONLY): 35 min   Charges:   PT Evaluation $PT Eval Moderate Complexity: 1 Mod PT Treatments $Gait Training: 8-22 mins        Marisa Severin, PT, DPT Acute Rehabilitation Services Pager [phone] Office [place] 01/29/2020, 9:59 AM

## 2020-01-29 NOTE — Evaluation (Signed)
Occupational Therapy Evaluation Patient Details Name: Todd Watson MRN: [id number] DOB: April 06, 1941 Today's Date: 01/29/2020    History of Present Illness 79 yo male s/p L4-5 lami. PMH including arthritis, HTN, and OA at spine.    Clinical Impression   PTA, pt was living with his wife and was independent. Pt currently performing ADLs and functional mobility at Supervision level. Providing education and handout on back precautions, bed mobility, UB ADLs, LB ADLs, grooming, toileting, and tub transfer with tub bench. Pt requiring Min cues to adherence to back precautions as he has tendency to reach for items and bend. Answered all pt questions. Recommend dc home once medically stable per physician. All acute OT needs met and will sign off. Thank you.     Follow Up Recommendations  No OT follow up;Supervision - Intermittent    Equipment Recommendations  None recommended by OT    Recommendations for Other Services PT consult     Precautions / Restrictions Precautions Precautions: Back Precaution Booklet Issued: Yes (comment) Precaution Comments: Reviewed back precautions and compensatory techniques for ADLs Required Braces or Orthoses: Other Brace Other Brace: No brace per MD order      Mobility Bed Mobility               General bed mobility comments: Pt sitting at EOB. Reviewed log roll technique   Transfers Overall transfer level: Needs assistance   Transfers: Sit to/from Stand Sit to Stand: Supervision         General transfer comment: supervision for safety    Balance Overall balance assessment: Mild deficits observed, not formally tested                                         ADL either performed or assessed with clinical judgement   ADL Overall ADL's : Needs assistance/impaired                                       General ADL Comments: Pt performing ADLs and functional mobiltiy at supervision level. Providing  education on back precations, LB ADLs, toileting, grooming, bed mobility, and tub transfer. Cues for adherance to precautions with tendency for bending     Vision         Perception     Praxis      Pertinent Vitals/Pain Pain Assessment: Faces Faces Pain Scale: Hurts little more Pain Location: Back Pain Descriptors / Indicators: Sore Pain Intervention(s): Monitored during session;Repositioned     Hand Dominance Right   Extremity/Trunk Assessment Upper Extremity Assessment Upper Extremity Assessment: Overall WFL for tasks assessed   Lower Extremity Assessment Lower Extremity Assessment: Defer to PT evaluation   Cervical / Trunk Assessment Cervical / Trunk Assessment: Other exceptions Cervical / Trunk Exceptions: s/p back sx   Communication Communication Communication: No difficulties   Cognition Arousal/Alertness: Awake/alert Behavior During Therapy: WFL for tasks assessed/performed Overall Cognitive Status: Within Functional Limits for tasks assessed                                     General Comments  Daughter present throughout session    Exercises     Shoulder Instructions      Home Living Family/patient expects to be  discharged to:: Private residence Living Arrangements: Spouse/significant other Available Help at Discharge: Family;Available 24 hours/day;Friend(s) Type of Home: House             Bathroom Shower/Tub: Teacher, early years/pre: Standard     Home Equipment: Tub bench;Walker - 2 wheels;Adaptive equipment Adaptive Equipment: Reacher        Prior Functioning/Environment Level of Independence: Independent        Comments: "I enjoy all kinds of work"        OT Problem List: Decreased activity tolerance;Impaired balance (sitting and/or standing);Decreased knowledge of use of DME or AE;Decreased knowledge of precautions;Pain      OT Treatment/Interventions: Self-care/ADL training;Therapeutic  exercise;Energy conservation;DME and/or AE instruction;Therapeutic activities;Patient/family education    OT Goals(Current goals can be found in the care plan section) Acute Rehab OT Goals Patient Stated Goal: Go home OT Goal Formulation: All assessment and education complete, DC therapy  OT Frequency: Min 2X/week   Barriers to D/C:            Co-evaluation              AM-PAC OT "6 Clicks" Daily Activity     Outcome Measure Help from another person eating meals?: None Help from another person taking care of personal grooming?: A Little Help from another person toileting, which includes using toliet, bedpan, or urinal?: A Little Help from another person bathing (including washing, rinsing, drying)?: A Little Help from another person to put on and taking off regular upper body clothing?: None Help from another person to put on and taking off regular lower body clothing?: A Little 6 Click Score: 20   End of Session Nurse Communication: Mobility status  Activity Tolerance: Patient tolerated treatment well Patient left: with family/visitor present(in bathroom brushing teeth)  OT Visit Diagnosis: Unsteadiness on feet (R26.81);Other abnormalities of gait and mobility (R26.89);Muscle weakness (generalized) (M62.81);Pain Pain - part of body: (Back)                Time: 6962-9528 OT Time Calculation (min): 22 min Charges:  OT General Charges $OT Visit: 1 Visit OT Evaluation $OT Eval Low Complexity: [place], OTR/L Acute Rehab Pager: [phone] Office: [place] 01/29/2020, 9:43 AM

## 2020-01-29 NOTE — Discharge Instructions (Signed)
Wound Care  Keep the incision clean and dry, remove the outer dressing in 2 days, leave the Steri-Strips intact. Do not put any creams, lotions, or ointments on incision.  Leave steri-strips on back.  They will fall off by themselves.  Activity Walk each and every day, increasing distance each day. No lifting greater than 5 lbs.  No lifting no bending no twisting no driving or riding a car unless coming back and forth to see me.  Diet Resume your normal diet.   Return to Work Will be discussed at you follow up appointment.  Call Your Doctor If Any of These Occur Redness, drainage, or swelling at the wound.  Temperature greater than 101 degrees. Severe pain not relieved by pain medication. Incision starts to come apart. Follow Up Appt Call today for appointment in 1-2 weeks [id number]) or for problems.  If you have any hardware placed in your spine, you will need an x-ray before your appointment.

## 2020-01-29 NOTE — Discharge Summary (Signed)
Physician Discharge Summary Patient ID: Todd Watson MRN: [id number] DOB/AGE: 81-Apr-1942 81 y.o. Estimated body mass index is 25.06 kg/m as calculated from the following:   Height as of this encounter: 5\' 7"  (1.702 m).   Weight as of this encounter: 72.6 kg.   Admit date: 01/28/2020 Discharge date: 01/29/2020  Admission Diagnoses: right L4 radiculopathy from synovial cyst and ruptured disc L4-5 right  Discharge Diagnoses:  same Active Problems:   Synovial cyst of lumbar facet joint   Discharged Condition: good  Hospital Course:  Patient was admitted to the hospital underwent lumbar laminectomy for resection of synovial cyst and lumbar microdiskectomy on the right at L4-5 postoperatively patient had significant improvement preoperative radicular pain was ambulating and voiding spontaneously tolerating regular diet stable for discharge home.  Consults: Significant Diagnostic Studies: Treatments:  Right L4-5 laminectomy microdiskectomy removal and resection of synovial cyst Discharge Exam: Blood pressure 139/71, pulse 62, temperature 98.6 F (37 C), temperature source Oral, resp. rate 17, height 5\' 7"  (1.702 m), weight 72.6 kg, SpO2 97 %.  strength 5/5 1 clean dry and intact  Disposition:  home  Discharge Instructions    Remove dressing in 72 hours   Complete by: As directed   Call MD for:  difficulty breathing, headache or visual disturbances   Complete by: As directed   Call MD for:  hives   Complete by: As directed   Call MD for:  persistant dizziness or light-headedness   Complete by: As directed   Call MD for:  persistant nausea and vomiting   Complete by: As directed   Call MD for:  redness, tenderness, or signs of infection (pain, swelling, redness, odor or green/yellow discharge around incision site)   Complete by: As directed   Call MD for:  severe uncontrolled pain   Complete by: As directed   Call MD for:  temperature >100.4   Complete by: As directed   Diet -  low sodium heart healthy   Complete by: As directed   Driving Restrictions   Complete by: As directed   No driving for 2 weeks, no riding in the car for 1 week  Increase activity slowly   Complete by: As directed   Lifting restrictions   Complete by: As directed   No lifting more than 8 lbs   Allergies as of 01/29/2020  No Known Allergies   Medication List  TAKE these medications  aspirin 81 MG tablet Take 81 mg by mouth daily.  atorvastatin 10 MG tablet Commonly known as: LIPITOR Take 10 mg by mouth at bedtime.  diclofenac Sodium 1 % Gel Commonly known as: VOLTAREN Apply 1 application topically 4 (four) times daily as needed (pain).  HYDROcodone-acetaminophen 5-325 MG tablet Commonly known as: NORCO/VICODIN Take 1 tablet by mouth every 6 (six) hours as needed for moderate pain. What changed: Another medication with the same name was added. Make sure you understand how and when to take each.  HYDROcodone-acetaminophen 5-325 MG tablet Commonly known as: NORCO/VICODIN Take 1 tablet by mouth every 4 (four) hours as needed for moderate pain. What changed: You were already taking a medication with the same name, and this prescription was added. Make sure you understand how and when to take each.  levothyroxine 88 MCG tablet Commonly known as: SYNTHROID Take 88 mcg by mouth daily before breakfast.  lisinopril 10 MG tablet Commonly known as: ZESTRIL Take 5 mg by mouth in the morning and at bedtime.  methocarbamol 500 MG tablet Commonly known  as: ROBAXIN Take 500 mg by mouth 4 (four) times daily. What changed: Another medication with the same name was added. Make sure you understand how and when to take each.  methocarbamol 500 MG tablet Commonly known as: Robaxin Take 1 tablet (500 mg total) by mouth 4 (four) times daily. What changed: You were already taking a medication with the same name, and this prescription was added. Make sure you understand how and when to take  each.  methylPREDNISolone 4 MG Tbpk tablet Commonly known as: MEDROL DOSEPAK Take as directed What changed:  how much to take how to take this when to take this additional instructions  multivitamin tablet Take 1 tablet by mouth daily.  naproxen sodium 220 MG tablet Commonly known as: ALEVE Take 220 mg by mouth daily as needed (pain).  NIGHT-TIME COLD/FLU RELIEF PO Take 1 tablet by mouth at bedtime.  Omega-3 1000 MG Caps Take 1,000 mg by mouth daily.  omeprazole 20 MG capsule Commonly known as: PRILOSEC Take 20 mg by mouth in the morning and at bedtime.  OSTEO BI-FLEX ONE PER DAY PO Take 1 tablet by mouth daily.  PROBIOTIC DAILY PO Take 1 capsule by mouth daily.  psyllium 58.6 % powder Commonly known as: METAMUCIL Take 1 packet by mouth daily as needed (constipation).  sildenafil 100 MG tablet Commonly known as: VIAGRA Take 100 mg by mouth as needed for erectile dysfunction.  tamsulosin 0.4 MG Caps capsule Commonly known as: FLOMAX Take 0.4 mg by mouth at bedtime.  vitamin C 500 MG tablet Commonly known as: ASCORBIC ACID Take 500 mg by mouth daily.    Follow-up Information    Kary Kos, MD Follow up.  Specialty: Neurosurgery Contact information: [address] [place] [address] [phone]        [facility], [facility] Follow up.  Specialty: Home Health Services Why: Home Health PT/OT arranged. They will contact you about 48 hours after discharge to arrange visit.  Contact information: [place] [address] [phone]         Signed: Kaylib Furness P 01/29/2020, 2:29 PM

## 2020-01-29 NOTE — TOC Transition Note (Signed)
Transition of Care [facility]) - CM/SW Discharge Note   Patient Details  Name: Todd Watson MRN: [id number] Date of Birth: 10-19-1940  Transition of [facility] CM/SW Contact:  Benard Halsted, LCSW Phone Number: 01/29/2020, 9:09 AM   Clinical Narrative:    CSW received consult for possible home health services at time of discharge. CSW spoke with patient and his daughter at bedside regarding PT recommendation of Home Health PT at time of discharge. Patient reported that he would like home health services. CSW sent referral for review and has been accepted by [facility]. CSW provided Medicare [place] ratings list. CSW confirmed PCP and address with patient: [place] [address], [place]. Patient's daughter providng transportation at discharge. No further questions reported at this time.     Final next level of care: [place] Barriers to Discharge: No Barriers Identified   Patient Goals and CMS Choice Patient states their goals for this hospitalization and ongoing recovery are:: Get stronger CMS Medicare.gov Compare Post Acute Care list provided to:: Patient Choice offered to / list presented to : Patient, Adult Children  Discharge Placement                       Discharge Plan and Services In-house Referral: Clinical Social Work Discharge Planning Services: CM Consult Post Acute Care Choice: Home Health                    HH Arranged: PT, OT [facility] Agency: [place] Date [place]: 01/29/20 Time [place]: 0908 Representative spoke with at [place]: [place] Determinants of Health ([place]) Interventions     Readmission Risk Interventions No flowsheet data found.

## 2020-01-31 DIAGNOSIS — N200 Calculus of kidney: Secondary | ICD-10-CM | POA: Diagnosis not present

## 2020-01-31 DIAGNOSIS — B029 Zoster without complications: Secondary | ICD-10-CM | POA: Diagnosis not present

## 2020-01-31 DIAGNOSIS — M47818 Spondylosis without myelopathy or radiculopathy, sacral and sacrococcygeal region: Secondary | ICD-10-CM | POA: Diagnosis not present

## 2020-01-31 DIAGNOSIS — K219 Gastro-esophageal reflux disease without esophagitis: Secondary | ICD-10-CM | POA: Diagnosis not present

## 2020-01-31 DIAGNOSIS — M47817 Spondylosis without myelopathy or radiculopathy, lumbosacral region: Secondary | ICD-10-CM | POA: Diagnosis not present

## 2020-01-31 DIAGNOSIS — Z4789 Encounter for other orthopedic aftercare: Secondary | ICD-10-CM | POA: Diagnosis not present

## 2020-01-31 DIAGNOSIS — I10 Essential (primary) hypertension: Secondary | ICD-10-CM | POA: Diagnosis not present

## 2020-01-31 DIAGNOSIS — M1990 Unspecified osteoarthritis, unspecified site: Secondary | ICD-10-CM | POA: Diagnosis not present

## 2020-01-31 DIAGNOSIS — M5116 Intervertebral disc disorders with radiculopathy, lumbar region: Secondary | ICD-10-CM | POA: Diagnosis not present

## 2020-01-31 DIAGNOSIS — M47816 Spondylosis without myelopathy or radiculopathy, lumbar region: Secondary | ICD-10-CM | POA: Diagnosis not present

## 2020-02-01 DIAGNOSIS — N200 Calculus of kidney: Secondary | ICD-10-CM | POA: Diagnosis not present

## 2020-02-01 DIAGNOSIS — I10 Essential (primary) hypertension: Secondary | ICD-10-CM | POA: Diagnosis not present

## 2020-02-01 DIAGNOSIS — Z4789 Encounter for other orthopedic aftercare: Secondary | ICD-10-CM | POA: Diagnosis not present

## 2020-02-01 DIAGNOSIS — K219 Gastro-esophageal reflux disease without esophagitis: Secondary | ICD-10-CM | POA: Diagnosis not present

## 2020-02-01 DIAGNOSIS — M47818 Spondylosis without myelopathy or radiculopathy, sacral and sacrococcygeal region: Secondary | ICD-10-CM | POA: Diagnosis not present

## 2020-02-01 DIAGNOSIS — M5116 Intervertebral disc disorders with radiculopathy, lumbar region: Secondary | ICD-10-CM | POA: Diagnosis not present

## 2020-02-01 DIAGNOSIS — B029 Zoster without complications: Secondary | ICD-10-CM | POA: Diagnosis not present

## 2020-02-01 DIAGNOSIS — M47816 Spondylosis without myelopathy or radiculopathy, lumbar region: Secondary | ICD-10-CM | POA: Diagnosis not present

## 2020-02-01 DIAGNOSIS — M47817 Spondylosis without myelopathy or radiculopathy, lumbosacral region: Secondary | ICD-10-CM | POA: Diagnosis not present

## 2020-02-01 DIAGNOSIS — M1990 Unspecified osteoarthritis, unspecified site: Secondary | ICD-10-CM | POA: Diagnosis not present

## 2020-02-05 DIAGNOSIS — M47816 Spondylosis without myelopathy or radiculopathy, lumbar region: Secondary | ICD-10-CM | POA: Diagnosis not present

## 2020-02-05 DIAGNOSIS — N200 Calculus of kidney: Secondary | ICD-10-CM | POA: Diagnosis not present

## 2020-02-05 DIAGNOSIS — B029 Zoster without complications: Secondary | ICD-10-CM | POA: Diagnosis not present

## 2020-02-05 DIAGNOSIS — K219 Gastro-esophageal reflux disease without esophagitis: Secondary | ICD-10-CM | POA: Diagnosis not present

## 2020-02-05 DIAGNOSIS — I10 Essential (primary) hypertension: Secondary | ICD-10-CM | POA: Diagnosis not present

## 2020-02-05 DIAGNOSIS — M47818 Spondylosis without myelopathy or radiculopathy, sacral and sacrococcygeal region: Secondary | ICD-10-CM | POA: Diagnosis not present

## 2020-02-05 DIAGNOSIS — M5116 Intervertebral disc disorders with radiculopathy, lumbar region: Secondary | ICD-10-CM | POA: Diagnosis not present

## 2020-02-05 DIAGNOSIS — M1990 Unspecified osteoarthritis, unspecified site: Secondary | ICD-10-CM | POA: Diagnosis not present

## 2020-02-05 DIAGNOSIS — Z4789 Encounter for other orthopedic aftercare: Secondary | ICD-10-CM | POA: Diagnosis not present

## 2020-02-05 DIAGNOSIS — M47817 Spondylosis without myelopathy or radiculopathy, lumbosacral region: Secondary | ICD-10-CM | POA: Diagnosis not present

## 2020-02-07 DIAGNOSIS — N200 Calculus of kidney: Secondary | ICD-10-CM | POA: Diagnosis not present

## 2020-02-07 DIAGNOSIS — M1990 Unspecified osteoarthritis, unspecified site: Secondary | ICD-10-CM | POA: Diagnosis not present

## 2020-02-07 DIAGNOSIS — I10 Essential (primary) hypertension: Secondary | ICD-10-CM | POA: Diagnosis not present

## 2020-02-07 DIAGNOSIS — K219 Gastro-esophageal reflux disease without esophagitis: Secondary | ICD-10-CM | POA: Diagnosis not present

## 2020-02-07 DIAGNOSIS — M47816 Spondylosis without myelopathy or radiculopathy, lumbar region: Secondary | ICD-10-CM | POA: Diagnosis not present

## 2020-02-07 DIAGNOSIS — M47818 Spondylosis without myelopathy or radiculopathy, sacral and sacrococcygeal region: Secondary | ICD-10-CM | POA: Diagnosis not present

## 2020-02-07 DIAGNOSIS — B029 Zoster without complications: Secondary | ICD-10-CM | POA: Diagnosis not present

## 2020-02-07 DIAGNOSIS — M47817 Spondylosis without myelopathy or radiculopathy, lumbosacral region: Secondary | ICD-10-CM | POA: Diagnosis not present

## 2020-02-07 DIAGNOSIS — Z4789 Encounter for other orthopedic aftercare: Secondary | ICD-10-CM | POA: Diagnosis not present

## 2020-02-07 DIAGNOSIS — M5116 Intervertebral disc disorders with radiculopathy, lumbar region: Secondary | ICD-10-CM | POA: Diagnosis not present

## 2020-02-11 DIAGNOSIS — B029 Zoster without complications: Secondary | ICD-10-CM | POA: Diagnosis not present

## 2020-02-11 DIAGNOSIS — M47818 Spondylosis without myelopathy or radiculopathy, sacral and sacrococcygeal region: Secondary | ICD-10-CM | POA: Diagnosis not present

## 2020-02-11 DIAGNOSIS — M1990 Unspecified osteoarthritis, unspecified site: Secondary | ICD-10-CM | POA: Diagnosis not present

## 2020-02-11 DIAGNOSIS — M5116 Intervertebral disc disorders with radiculopathy, lumbar region: Secondary | ICD-10-CM | POA: Diagnosis not present

## 2020-02-11 DIAGNOSIS — N200 Calculus of kidney: Secondary | ICD-10-CM | POA: Diagnosis not present

## 2020-02-11 DIAGNOSIS — Z4789 Encounter for other orthopedic aftercare: Secondary | ICD-10-CM | POA: Diagnosis not present

## 2020-02-11 DIAGNOSIS — M47816 Spondylosis without myelopathy or radiculopathy, lumbar region: Secondary | ICD-10-CM | POA: Diagnosis not present

## 2020-02-11 DIAGNOSIS — K219 Gastro-esophageal reflux disease without esophagitis: Secondary | ICD-10-CM | POA: Diagnosis not present

## 2020-02-11 DIAGNOSIS — I10 Essential (primary) hypertension: Secondary | ICD-10-CM | POA: Diagnosis not present

## 2020-02-11 DIAGNOSIS — M47817 Spondylosis without myelopathy or radiculopathy, lumbosacral region: Secondary | ICD-10-CM | POA: Diagnosis not present

## 2020-02-12 DIAGNOSIS — M47817 Spondylosis without myelopathy or radiculopathy, lumbosacral region: Secondary | ICD-10-CM | POA: Diagnosis not present

## 2020-02-12 DIAGNOSIS — M47816 Spondylosis without myelopathy or radiculopathy, lumbar region: Secondary | ICD-10-CM | POA: Diagnosis not present

## 2020-02-12 DIAGNOSIS — K219 Gastro-esophageal reflux disease without esophagitis: Secondary | ICD-10-CM | POA: Diagnosis not present

## 2020-02-12 DIAGNOSIS — I10 Essential (primary) hypertension: Secondary | ICD-10-CM | POA: Diagnosis not present

## 2020-02-12 DIAGNOSIS — M1990 Unspecified osteoarthritis, unspecified site: Secondary | ICD-10-CM | POA: Diagnosis not present

## 2020-02-12 DIAGNOSIS — N200 Calculus of kidney: Secondary | ICD-10-CM | POA: Diagnosis not present

## 2020-02-12 DIAGNOSIS — M5116 Intervertebral disc disorders with radiculopathy, lumbar region: Secondary | ICD-10-CM | POA: Diagnosis not present

## 2020-02-12 DIAGNOSIS — M47818 Spondylosis without myelopathy or radiculopathy, sacral and sacrococcygeal region: Secondary | ICD-10-CM | POA: Diagnosis not present

## 2020-02-12 DIAGNOSIS — B029 Zoster without complications: Secondary | ICD-10-CM | POA: Diagnosis not present

## 2020-02-12 DIAGNOSIS — Z4789 Encounter for other orthopedic aftercare: Secondary | ICD-10-CM | POA: Diagnosis not present

## 2020-02-13 DIAGNOSIS — Z4789 Encounter for other orthopedic aftercare: Secondary | ICD-10-CM | POA: Diagnosis not present

## 2020-02-13 DIAGNOSIS — M5116 Intervertebral disc disorders with radiculopathy, lumbar region: Secondary | ICD-10-CM | POA: Diagnosis not present

## 2020-02-13 DIAGNOSIS — M47817 Spondylosis without myelopathy or radiculopathy, lumbosacral region: Secondary | ICD-10-CM | POA: Diagnosis not present

## 2020-02-13 DIAGNOSIS — B029 Zoster without complications: Secondary | ICD-10-CM | POA: Diagnosis not present

## 2020-02-13 DIAGNOSIS — I10 Essential (primary) hypertension: Secondary | ICD-10-CM | POA: Diagnosis not present

## 2020-02-13 DIAGNOSIS — M1990 Unspecified osteoarthritis, unspecified site: Secondary | ICD-10-CM | POA: Diagnosis not present

## 2020-02-13 DIAGNOSIS — K219 Gastro-esophageal reflux disease without esophagitis: Secondary | ICD-10-CM | POA: Diagnosis not present

## 2020-02-13 DIAGNOSIS — M47816 Spondylosis without myelopathy or radiculopathy, lumbar region: Secondary | ICD-10-CM | POA: Diagnosis not present

## 2020-02-13 DIAGNOSIS — N200 Calculus of kidney: Secondary | ICD-10-CM | POA: Diagnosis not present

## 2020-02-13 DIAGNOSIS — M47818 Spondylosis without myelopathy or radiculopathy, sacral and sacrococcygeal region: Secondary | ICD-10-CM | POA: Diagnosis not present

## 2020-02-14 DIAGNOSIS — B029 Zoster without complications: Secondary | ICD-10-CM | POA: Diagnosis not present

## 2020-02-14 DIAGNOSIS — M47818 Spondylosis without myelopathy or radiculopathy, sacral and sacrococcygeal region: Secondary | ICD-10-CM | POA: Diagnosis not present

## 2020-02-14 DIAGNOSIS — M47817 Spondylosis without myelopathy or radiculopathy, lumbosacral region: Secondary | ICD-10-CM | POA: Diagnosis not present

## 2020-02-14 DIAGNOSIS — Z4789 Encounter for other orthopedic aftercare: Secondary | ICD-10-CM | POA: Diagnosis not present

## 2020-02-14 DIAGNOSIS — N200 Calculus of kidney: Secondary | ICD-10-CM | POA: Diagnosis not present

## 2020-02-14 DIAGNOSIS — K219 Gastro-esophageal reflux disease without esophagitis: Secondary | ICD-10-CM | POA: Diagnosis not present

## 2020-02-14 DIAGNOSIS — M47816 Spondylosis without myelopathy or radiculopathy, lumbar region: Secondary | ICD-10-CM | POA: Diagnosis not present

## 2020-02-14 DIAGNOSIS — I10 Essential (primary) hypertension: Secondary | ICD-10-CM | POA: Diagnosis not present

## 2020-02-14 DIAGNOSIS — M1990 Unspecified osteoarthritis, unspecified site: Secondary | ICD-10-CM | POA: Diagnosis not present

## 2020-02-14 DIAGNOSIS — M5116 Intervertebral disc disorders with radiculopathy, lumbar region: Secondary | ICD-10-CM | POA: Diagnosis not present

## 2020-02-19 DIAGNOSIS — N200 Calculus of kidney: Secondary | ICD-10-CM | POA: Diagnosis not present

## 2020-02-19 DIAGNOSIS — Z4789 Encounter for other orthopedic aftercare: Secondary | ICD-10-CM | POA: Diagnosis not present

## 2020-02-19 DIAGNOSIS — I10 Essential (primary) hypertension: Secondary | ICD-10-CM | POA: Diagnosis not present

## 2020-02-19 DIAGNOSIS — M1990 Unspecified osteoarthritis, unspecified site: Secondary | ICD-10-CM | POA: Diagnosis not present

## 2020-02-19 DIAGNOSIS — M47817 Spondylosis without myelopathy or radiculopathy, lumbosacral region: Secondary | ICD-10-CM | POA: Diagnosis not present

## 2020-02-19 DIAGNOSIS — M5116 Intervertebral disc disorders with radiculopathy, lumbar region: Secondary | ICD-10-CM | POA: Diagnosis not present

## 2020-02-19 DIAGNOSIS — M47816 Spondylosis without myelopathy or radiculopathy, lumbar region: Secondary | ICD-10-CM | POA: Diagnosis not present

## 2020-02-19 DIAGNOSIS — K219 Gastro-esophageal reflux disease without esophagitis: Secondary | ICD-10-CM | POA: Diagnosis not present

## 2020-02-19 DIAGNOSIS — M47818 Spondylosis without myelopathy or radiculopathy, sacral and sacrococcygeal region: Secondary | ICD-10-CM | POA: Diagnosis not present

## 2020-02-19 DIAGNOSIS — B029 Zoster without complications: Secondary | ICD-10-CM | POA: Diagnosis not present

## 2020-02-20 DIAGNOSIS — B029 Zoster without complications: Secondary | ICD-10-CM | POA: Diagnosis not present

## 2020-02-20 DIAGNOSIS — M5116 Intervertebral disc disorders with radiculopathy, lumbar region: Secondary | ICD-10-CM | POA: Diagnosis not present

## 2020-02-20 DIAGNOSIS — M47817 Spondylosis without myelopathy or radiculopathy, lumbosacral region: Secondary | ICD-10-CM | POA: Diagnosis not present

## 2020-02-20 DIAGNOSIS — I10 Essential (primary) hypertension: Secondary | ICD-10-CM | POA: Diagnosis not present

## 2020-02-20 DIAGNOSIS — M47818 Spondylosis without myelopathy or radiculopathy, sacral and sacrococcygeal region: Secondary | ICD-10-CM | POA: Diagnosis not present

## 2020-02-20 DIAGNOSIS — K219 Gastro-esophageal reflux disease without esophagitis: Secondary | ICD-10-CM | POA: Diagnosis not present

## 2020-02-20 DIAGNOSIS — M47816 Spondylosis without myelopathy or radiculopathy, lumbar region: Secondary | ICD-10-CM | POA: Diagnosis not present

## 2020-02-20 DIAGNOSIS — N200 Calculus of kidney: Secondary | ICD-10-CM | POA: Diagnosis not present

## 2020-02-20 DIAGNOSIS — Z4789 Encounter for other orthopedic aftercare: Secondary | ICD-10-CM | POA: Diagnosis not present

## 2020-02-20 DIAGNOSIS — M1990 Unspecified osteoarthritis, unspecified site: Secondary | ICD-10-CM | POA: Diagnosis not present

## 2020-02-21 DIAGNOSIS — K219 Gastro-esophageal reflux disease without esophagitis: Secondary | ICD-10-CM | POA: Diagnosis not present

## 2020-02-21 DIAGNOSIS — M5116 Intervertebral disc disorders with radiculopathy, lumbar region: Secondary | ICD-10-CM | POA: Diagnosis not present

## 2020-02-21 DIAGNOSIS — Z4789 Encounter for other orthopedic aftercare: Secondary | ICD-10-CM | POA: Diagnosis not present

## 2020-02-21 DIAGNOSIS — I10 Essential (primary) hypertension: Secondary | ICD-10-CM | POA: Diagnosis not present

## 2020-02-21 DIAGNOSIS — M47818 Spondylosis without myelopathy or radiculopathy, sacral and sacrococcygeal region: Secondary | ICD-10-CM | POA: Diagnosis not present

## 2020-02-21 DIAGNOSIS — M1990 Unspecified osteoarthritis, unspecified site: Secondary | ICD-10-CM | POA: Diagnosis not present

## 2020-02-21 DIAGNOSIS — B029 Zoster without complications: Secondary | ICD-10-CM | POA: Diagnosis not present

## 2020-02-21 DIAGNOSIS — N200 Calculus of kidney: Secondary | ICD-10-CM | POA: Diagnosis not present

## 2020-02-21 DIAGNOSIS — M47816 Spondylosis without myelopathy or radiculopathy, lumbar region: Secondary | ICD-10-CM | POA: Diagnosis not present

## 2020-02-21 DIAGNOSIS — M47817 Spondylosis without myelopathy or radiculopathy, lumbosacral region: Secondary | ICD-10-CM | POA: Diagnosis not present

## 2020-02-26 DIAGNOSIS — N200 Calculus of kidney: Secondary | ICD-10-CM | POA: Diagnosis not present

## 2020-02-26 DIAGNOSIS — M47817 Spondylosis without myelopathy or radiculopathy, lumbosacral region: Secondary | ICD-10-CM | POA: Diagnosis not present

## 2020-02-26 DIAGNOSIS — K219 Gastro-esophageal reflux disease without esophagitis: Secondary | ICD-10-CM | POA: Diagnosis not present

## 2020-02-26 DIAGNOSIS — B029 Zoster without complications: Secondary | ICD-10-CM | POA: Diagnosis not present

## 2020-02-26 DIAGNOSIS — Z4789 Encounter for other orthopedic aftercare: Secondary | ICD-10-CM | POA: Diagnosis not present

## 2020-02-26 DIAGNOSIS — I10 Essential (primary) hypertension: Secondary | ICD-10-CM | POA: Diagnosis not present

## 2020-02-26 DIAGNOSIS — M47818 Spondylosis without myelopathy or radiculopathy, sacral and sacrococcygeal region: Secondary | ICD-10-CM | POA: Diagnosis not present

## 2020-02-26 DIAGNOSIS — M1990 Unspecified osteoarthritis, unspecified site: Secondary | ICD-10-CM | POA: Diagnosis not present

## 2020-02-26 DIAGNOSIS — M47816 Spondylosis without myelopathy or radiculopathy, lumbar region: Secondary | ICD-10-CM | POA: Diagnosis not present

## 2020-02-26 DIAGNOSIS — M5116 Intervertebral disc disorders with radiculopathy, lumbar region: Secondary | ICD-10-CM | POA: Diagnosis not present

## 2020-04-07 DIAGNOSIS — N50812 Left testicular pain: Secondary | ICD-10-CM | POA: Diagnosis not present

## 2020-04-29 DIAGNOSIS — E039 Hypothyroidism, unspecified: Secondary | ICD-10-CM | POA: Diagnosis not present

## 2020-04-29 DIAGNOSIS — E785 Hyperlipidemia, unspecified: Secondary | ICD-10-CM | POA: Diagnosis not present

## 2020-04-29 DIAGNOSIS — Z7984 Long term (current) use of oral hypoglycemic drugs: Secondary | ICD-10-CM | POA: Diagnosis not present

## 2020-04-29 DIAGNOSIS — N529 Male erectile dysfunction, unspecified: Secondary | ICD-10-CM | POA: Diagnosis not present

## 2020-04-29 DIAGNOSIS — E119 Type 2 diabetes mellitus without complications: Secondary | ICD-10-CM | POA: Diagnosis not present

## 2020-04-29 DIAGNOSIS — K219 Gastro-esophageal reflux disease without esophagitis: Secondary | ICD-10-CM | POA: Diagnosis not present

## 2020-04-29 DIAGNOSIS — M1990 Unspecified osteoarthritis, unspecified site: Secondary | ICD-10-CM | POA: Diagnosis not present

## 2020-04-29 DIAGNOSIS — N400 Enlarged prostate without lower urinary tract symptoms: Secondary | ICD-10-CM | POA: Diagnosis not present

## 2020-04-29 DIAGNOSIS — Z809 Family history of malignant neoplasm, unspecified: Secondary | ICD-10-CM | POA: Diagnosis not present

## 2020-04-29 DIAGNOSIS — I10 Essential (primary) hypertension: Secondary | ICD-10-CM | POA: Diagnosis not present

## 2020-05-22 DIAGNOSIS — M5416 Radiculopathy, lumbar region: Secondary | ICD-10-CM | POA: Diagnosis not present

## 2020-05-31 DIAGNOSIS — J40 Bronchitis, not specified as acute or chronic: Secondary | ICD-10-CM | POA: Diagnosis not present

## 2020-05-31 DIAGNOSIS — R05 Cough: Secondary | ICD-10-CM | POA: Diagnosis not present

## 2020-06-26 ENCOUNTER — Other Ambulatory Visit: Payer: Self-pay

## 2020-06-27 ENCOUNTER — Encounter: Payer: Self-pay | Admitting: Adult Health

## 2020-06-27 ENCOUNTER — Ambulatory Visit (INDEPENDENT_AMBULATORY_CARE_PROVIDER_SITE_OTHER): Payer: Medicare HMO | Admitting: Adult Health

## 2020-06-27 VITALS — BP 124/70 | HR 49 | Ht 67.0 in | Wt 157.5 lb

## 2020-06-27 DIAGNOSIS — Z23 Encounter for immunization: Secondary | ICD-10-CM

## 2020-06-27 DIAGNOSIS — L570 Actinic keratosis: Secondary | ICD-10-CM | POA: Diagnosis not present

## 2020-06-27 NOTE — Progress Notes (Signed)
Subjective:    Patient ID: Todd Watson, male    DOB: 04/18/1941, 79 y.o.   MRN: [id number]  HPI  79 year old male who  has a past medical history of Arthritis, Diverticulitis, GERD (gastroesophageal reflux disease), Herpes zoster, Hiatal hernia, History of kidney stones, Hypertension, Hypothyroidism, Osteoarthritis of lumbar spine, Paralysis ([place]), and Pneumonia.   He presents to the office today for concern of abnormal skin growths on his face and neck. He does have history of AK's which have been burned off in cryotherapy in the past in the past.   Review of Systems See HPI   Past Medical History:  Diagnosis Date   Arthritis    Diverticulitis    GERD (gastroesophageal reflux disease)    Herpes zoster    Hiatal hernia    History of kidney stones    Hypertension    Hypothyroidism    Osteoarthritis of lumbar spine    Paralysis ([facility])    Pneumonia     Social History   Socioeconomic History   Marital status: Married    Spouse name: Not on file   Number of children: 3   Years of education: 10   Highest education level: GED or equivalent  Occupational History   Occupation: retired  Tobacco Use   Smoking status: Never Smoker   Smokeless tobacco: Never Used  Substance and Sexual Activity   Alcohol use: No   Drug use: No   Sexual activity: Yes  Other Topics Concern   Not on file  Social History Narrative   Lives with wife.    3 children     6 grandchildren & 5 great grandchildren   Likes to fish    Social Determinants of Health   Financial Resource Strain:    Difficulty of Paying Living Expenses: Not on file  Food Insecurity: No Food Insecurity   Worried About Charity fundraiser in the Last Year: Never true   Ran Out of Food in the Last Year: Never true  Transportation Needs: No Transportation Needs   Lack of Transportation (Medical): No   Lack of Transportation (Non-Medical): No  Physical Activity: Sufficiently Active   Days of  Exercise per Week: 5 days   Minutes of Exercise per Session: 60 min  Stress: No Stress Concern Present   Feeling of Stress : Only a little  Social Connections: Moderately Isolated   Frequency of Communication with Friends and Family: More than three times a week   Frequency of Social Gatherings with Friends and Family: Once a week   Attends Religious Services: Never   Marine scientist or Organizations: No   Attends Music therapist: Not on file   Marital Status: Married  Human resources officer Violence:    Fear of Current or Ex-Partner: Not on file   Emotionally Abused: Not on file   Physically Abused: Not on file   Sexually Abused: Not on file    Past Surgical History:  Procedure Laterality Date   APPENDECTOMY  2000   INGUINAL HERNIA REPAIR Bilateral    LUMBAR LAMINECTOMY/DECOMPRESSION MICRODISCECTOMY Right 01/28/2020   Procedure: Microdiscectomy - right - Lumbar four-Lumbar five;  Surgeon: Kary Kos, MD;  Location: [facility];  Service: Neurosurgery;  Laterality: Right;   SPINE SURGERY     spurs    Family History  Problem Relation Age of Onset   Heart disease Father        Valve disease   Kidney failure Mother  No Known Allergies  Current Outpatient Medications on File Prior to Visit  Medication Sig Dispense Refill   aspirin 81 MG tablet Take 81 mg by mouth daily.     atorvastatin (LIPITOR) 10 MG tablet Take 10 mg by mouth at bedtime.     Boswellia-Glucosamine-Vit D (OSTEO BI-FLEX ONE PER DAY PO) Take 1 tablet by mouth daily.     diclofenac Sodium (VOLTAREN) 1 % GEL Apply 1 application topically 4 (four) times daily as needed (pain).     DM-Doxylamine-Acetaminophen (NIGHT-TIME COLD/FLU RELIEF PO) Take 1 tablet by mouth at bedtime.     HYDROcodone-acetaminophen (NORCO/VICODIN) 5-325 MG tablet Take 1 tablet by mouth every 6 (six) hours as needed for moderate pain.     HYDROcodone-acetaminophen (NORCO/VICODIN) 5-325 MG tablet Take 1  tablet by mouth every 4 (four) hours as needed for moderate pain. 20 tablet 0   levothyroxine (SYNTHROID) 88 MCG tablet Take 88 mcg by mouth daily before breakfast.     lisinopril (PRINIVIL,ZESTRIL) 10 MG tablet Take 5 mg by mouth in the morning and at bedtime.      methocarbamol (ROBAXIN) 500 MG tablet Take 500 mg by mouth 4 (four) times daily.     methocarbamol (ROBAXIN) 500 MG tablet Take 1 tablet (500 mg total) by mouth 4 (four) times daily. 30 tablet 0   methylPREDNISolone (MEDROL DOSEPAK) 4 MG TBPK tablet Take as directed (Patient taking differently: Take 4-24 mg by mouth See admin instructions. (Typical regimens for 21 tablet dose packs of Methylprednisolone 4mg , Prednisone 5mg , and Prednisone 10mg ) Day 1: 2 tabs before breakfast, 1 tab after lunch, 1 tab after supper, and 2 tabs at bedtime. Day 2: 1 tab before breakfast, 1 tab after lunch, 1 tab after supper, and 2 tabs at bedtime. Day 3: 1 tab before breakfast, 1 tab after lunch, 1 tab after supper, and 1 tab at bedtime. Day 4: 1 tab before breakfast, 1 tab after lunch, and 1 tab at bedtime. Day 5: 1 tab before breakfast and 1 tab at bedtime. Day 6: 1 tab before breakfast.) 21 tablet 0   Multiple Vitamin (MULTIVITAMIN) tablet Take 1 tablet by mouth daily.     naproxen sodium (ANAPROX) 220 MG tablet Take 220 mg by mouth daily as needed (pain).      Omega-3 1000 MG CAPS Take 1,000 mg by mouth daily.     omeprazole (PRILOSEC) 20 MG capsule Take 20 mg by mouth in the morning and at bedtime.     Probiotic Product (PROBIOTIC DAILY PO) Take 1 capsule by mouth daily.      psyllium (METAMUCIL) 58.6 % powder Take 1 packet by mouth daily as needed (constipation).      sildenafil (VIAGRA) 100 MG tablet Take 100 mg by mouth as needed for erectile dysfunction.      tamsulosin (FLOMAX) 0.4 MG CAPS capsule Take 0.4 mg by mouth at bedtime.     vitamin C (ASCORBIC ACID) 500 MG tablet Take 500 mg by mouth daily.     No current  facility-administered medications on file prior to visit.    BP 124/70    Pulse (!) 49    Ht 5\' 7"  (1.702 m)    Wt 157 lb 8 oz (71.4 kg)    SpO2 97%    BMI 24.67 kg/m       Objective:   Physical Exam Vitals and nursing note reviewed.  Constitutional:      Appearance: Normal appearance.  Skin:    General: Skin  is warm and dry.     Comments: Actinic keratosis noted on left cheek and multiple noted around his neck  Neurological:     General: No focal deficit present.     Mental Status: He is alert and oriented to person, place, and time.  Psychiatric:        Mood and Affect: Mood normal.        Behavior: Behavior normal.        Thought Content: Thought content normal.        Judgment: Judgment normal.       Assessment & Plan:  1. Actinic keratosis Patient consented verbally to cryotherapy. Using 2 freeze thaw cycles each; 1 AK was burned on his left cheek and 5 other were burned around his neck. Patient tolerated procedure well. Okay to take Tylenol for symptom relief over the next 2 days. -Follow-up as needed  [name], NP

## 2020-06-27 NOTE — Addendum Note (Signed)
Addended by: Rodrigo Ran on: 06/27/2020 10:36 AM   Modules accepted: Orders

## 2020-08-25 ENCOUNTER — Ambulatory Visit: Payer: Medicare HMO | Admitting: Orthopaedic Surgery

## 2020-08-25 ENCOUNTER — Ambulatory Visit (INDEPENDENT_AMBULATORY_CARE_PROVIDER_SITE_OTHER): Payer: Medicare HMO

## 2020-08-25 VITALS — Ht 67.0 in | Wt 157.0 lb

## 2020-08-25 DIAGNOSIS — M25552 Pain in left hip: Secondary | ICD-10-CM | POA: Diagnosis not present

## 2020-08-25 NOTE — Progress Notes (Signed)
Office Visit Note   Patient: Todd Watson           Date of Birth: [birth year]           MRN: [id number] Visit Date: 08/25/2020              Requested by: [name], NP [address] [place],  [place] [address] PCP: [name], NP   Assessment & Plan: Visit Diagnoses:  1. Pain in left hip     Plan: I gave him reassurance that this is not a hip issue from my standpoint and his hip exam is entirely normal as are his hip x-rays showing only minimal arthritic changes.  I recommended he get a follow-up along with Dr. Saintclair Halsted for an assessment of his low back area.  He may just need physical therapy or potentially even just a facet joint injection or an SI joint injection but based on my exam of his left hip, there is no worrisome features.  All questions and concerns were answered and addressed.  Follow-up with me is as needed  Follow-Up Instructions: Return if symptoms worsen or fail to improve.   Orders:  Orders Placed This Encounter  Procedures  . XR HIP UNILAT W OR W/O PELVIS 1V LEFT   No orders of the defined types were placed in this encounter.     Procedures: No procedures performed   Clinical Data: No additional findings.   Subjective: Chief Complaint  Patient presents with  . Left Hip - Pain  The patient is a very pleasant and active 79 year old gentleman I am seeing for the first time.  I think have seen his wife before.  He does have a history of lumbar spine surgery earlier this year by Dr. Kary Kos.  He comes in today with a chief complaint of left hip pain but he points to the low back in the back of his pelvis the source of his pain.  He denies any left groin pain or pain on the side of his left hip.  He is very active and has been using a chainsaw and doing other activities.  He said this pain has been there for several months but he denies any numbness and tingling.  He denies any change in bowel or bladder function either.  He does not walk with  any assistive device.  HPI  Review of Systems He currently denies any headache, chest pain, short of breath, fever, chills, nausea, vomiting  Objective: Vital Signs: Ht 5\' 7"  (1.702 m)   Wt 157 lb (71.2 kg)   BMI 24.59 kg/m   Physical Exam He is alert and oriented x3 and in no acute distress Ortho Exam Examination of both hips shows smooth and fluid range of motion with no pain in the groin at all or pain with compression of either hip.  There is no pain over the trochanteric area of either hip.  He has a negative straight leg raise bilaterally. Specialty Comments:  No specialty comments available.  Imaging: XR HIP UNILAT W OR W/O PELVIS 1V LEFT  Result Date: 08/25/2020 An AP pelvis and lateral of the left hip shows no acute findings.  There is only minimal arthritic changes.    PMFS History: Patient Active Problem List   Diagnosis Date Noted  . Synovial cyst of lumbar facet joint 01/28/2020  . Bradycardia 07/31/2015  . Essential hypertension, benign 07/04/2014  . Hypothyroidism 07/04/2014  . GERD (gastroesophageal reflux disease) 07/04/2014  . Lumbar  spondylosis 07/04/2014  . Diabetes ([place]) 07/04/2014  . Cholelithiases 07/04/2014  . Herpes zoster 07/04/2014  . History of renal calculi 07/04/2014  . Diverticulitis of colon 07/04/2014  . Hiatal hernia 07/04/2014   Past Medical History:  Diagnosis Date  . Arthritis   . Diverticulitis   . GERD (gastroesophageal reflux disease)   . Herpes zoster   . Hiatal hernia   . History of kidney stones   . Hypertension   . Hypothyroidism   . Osteoarthritis of lumbar spine   . Paralysis ([place])   . Pneumonia     Family History  Problem Relation Age of Onset  . Heart disease Father        Valve disease  . Kidney failure Mother     Past Surgical History:  Procedure Laterality Date  . APPENDECTOMY  2000  . INGUINAL HERNIA REPAIR Bilateral   . LUMBAR LAMINECTOMY/DECOMPRESSION MICRODISCECTOMY Right 01/28/2020   Procedure:  Microdiscectomy - right - Lumbar four-Lumbar five;  Surgeon: Kary Kos, MD;  Location: [facility];  Service: Neurosurgery;  Laterality: Right;  . SPINE SURGERY     spurs   Social History   Occupational History  . Occupation: retired  Tobacco Use  . Smoking status: Never Smoker  . Smokeless tobacco: Never Used  Substance and Sexual Activity  . Alcohol use: No  . Drug use: No  . Sexual activity: Yes

## 2020-08-26 DIAGNOSIS — I10 Essential (primary) hypertension: Secondary | ICD-10-CM | POA: Diagnosis not present

## 2020-08-26 DIAGNOSIS — M5416 Radiculopathy, lumbar region: Secondary | ICD-10-CM | POA: Diagnosis not present

## 2020-08-26 DIAGNOSIS — Z6825 Body mass index (BMI) 25.0-25.9, adult: Secondary | ICD-10-CM | POA: Diagnosis not present

## 2020-08-27 ENCOUNTER — Ambulatory Visit (INDEPENDENT_AMBULATORY_CARE_PROVIDER_SITE_OTHER): Payer: Medicare HMO | Admitting: Family Medicine

## 2020-08-27 ENCOUNTER — Other Ambulatory Visit: Payer: Self-pay

## 2020-08-27 ENCOUNTER — Encounter: Payer: Self-pay | Admitting: Family Medicine

## 2020-08-27 VITALS — BP 124/70 | HR 55 | Temp 98.2°F | Ht 67.0 in | Wt 159.2 lb

## 2020-08-27 DIAGNOSIS — S76212A Strain of adductor muscle, fascia and tendon of left thigh, initial encounter: Secondary | ICD-10-CM | POA: Diagnosis not present

## 2020-08-27 NOTE — Progress Notes (Signed)
° °  Subjective:    Patient ID: Todd Watson, male    DOB: 1940-12-16, 79 y.o.   MRN: [id number]  HPI Here for a pain in the left groin that started this morning as he was riding his garden tractor. When he pushed in the clutch, he felt a sharp pain that has remained. No problems with urinating or stooling.    Review of Systems  Constitutional: Negative.   Respiratory: Negative.   Cardiovascular: Negative.   Gastrointestinal: Negative.        Objective:   Physical Exam Constitutional:      Appearance: Normal appearance. He is not ill-appearing.  Cardiovascular:     Rate and Rhythm: Normal rate and regular rhythm.     Pulses: Normal pulses.     Heart sounds: Normal heart sounds.  Pulmonary:     Effort: Pulmonary effort is normal.     Breath sounds: Normal breath sounds.  Musculoskeletal:     Comments: He is tender over the left hip flexor, full ROM. No hernias.   Neurological:     Mental Status: He is alert.           Assessment & Plan:  Groin strain. Rest and use ice packs. Recheck prn.  Alysia Penna, MD

## 2020-09-16 DIAGNOSIS — M5416 Radiculopathy, lumbar region: Secondary | ICD-10-CM | POA: Diagnosis not present

## 2020-10-09 DIAGNOSIS — M25552 Pain in left hip: Secondary | ICD-10-CM | POA: Diagnosis not present

## 2020-10-23 DIAGNOSIS — M25552 Pain in left hip: Secondary | ICD-10-CM | POA: Diagnosis not present

## 2020-11-03 ENCOUNTER — Telehealth: Payer: Self-pay | Admitting: Adult Health

## 2020-11-03 NOTE — Telephone Encounter (Signed)
Tried calling patient to schedule Medicare Annual Wellness Visit (AWV) either virtually or in office.  No answer  Last AWV 10/12/19  please schedule at anytime with [facility] Nurse Health Advisor 1 or 2   This should be a 45 minute visit.

## 2020-11-06 ENCOUNTER — Encounter: Payer: Self-pay | Admitting: Adult Health

## 2020-11-06 ENCOUNTER — Ambulatory Visit: Payer: Medicare HMO | Admitting: Adult Health

## 2020-11-06 DIAGNOSIS — E1169 Type 2 diabetes mellitus with other specified complication: Secondary | ICD-10-CM | POA: Insufficient documentation

## 2020-11-06 DIAGNOSIS — E782 Mixed hyperlipidemia: Secondary | ICD-10-CM | POA: Insufficient documentation

## 2020-11-06 NOTE — Progress Notes (Deleted)
Subjective:    Patient ID: Todd Watson, male    DOB: 1941/05/07, 80 y.o.   MRN: [id number]  HPI Patient presents for yearly preventative medicine examination. He is a pleasant 80 year old male who  has a past medical history of Arthritis, Diverticulitis, GERD (gastroesophageal reflux disease), Herpes zoster, Hiatal hernia, History of kidney stones, Hypertension, Hypothyroidism, Osteoarthritis of lumbar spine, Paralysis ([place]), and Pneumonia.  He continues to get most of care at the [facility] in [place], [place]   Hyperlipidemia-takes Lipitor 20 mg daily.  He denies myalgia or fatigue Lab Results  Component Value Date   CHOL 160 09/28/2018   HDL 41.10 09/28/2018   LDLCALC 92 09/28/2018   TRIG 133.0 09/28/2018   CHOLHDL 4 09/28/2018    Hypertension-is controlled well with lisinopril 10 mg daily.  He denies dizziness, lightheadedness, chest pain, shortness of breath, or syncopal episodes  BP Readings from Last 3 Encounters:  08/27/20 124/70  06/27/20 124/70  01/29/20 139/71   Hypothyroidism-takes Synthroid 125 mcg every morning.  He does feel well controlled on this medication  Lab Results  Component Value Date   TSH 2.30 09/28/2018    Diabetes mellitus-is diet controlled  Lab Results  Component Value Date   HGBA1C 6.9 (H) 01/28/2020   Osteoarthritis - will take Nsaids PRN and feels as though this works well for him   BPH - Takes Flomax 0.4 mg daily   All immunizations and health maintenance protocols were reviewed with the patient and needed orders were placed.  Appropriate screening laboratory values were ordered for the patient including screening of hyperlipidemia, renal function and hepatic function. If indicated by BPH, a PSA was ordered.  Medication reconciliation,  past medical history, social history, problem list and allergies were reviewed in detail with the patient  Goals were established with regard to weight loss, exercise, and  diet in compliance with  medications. 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  Wt Readings from Last 3 Encounters:  08/27/20 159 lb 3.2 oz (72.2 kg)  08/25/20 157 lb (71.2 kg)  06/27/20 157 lb 8 oz (71.4 kg)   End of life planning was discussed. He has an advanced directive and living will.    Review of Systems  Constitutional: Negative.   HENT: Positive for hearing loss.   Eyes: Negative.   Respiratory: Negative.   Cardiovascular: Negative.   Gastrointestinal: Negative.   Endocrine: Negative.   Genitourinary: Negative.   Musculoskeletal: Positive for arthralgias and back pain.  Skin: Negative.   Allergic/Immunologic: Negative.   Neurological: Negative.   Hematological: Negative.   Psychiatric/Behavioral: Negative.   All other systems reviewed and are negative.  Past Medical History:  Diagnosis Date  . Arthritis   . Diverticulitis   . GERD (gastroesophageal reflux disease)   . Herpes zoster   . Hiatal hernia   . History of kidney stones   . Hypertension   . Hypothyroidism   . Osteoarthritis of lumbar spine   . Paralysis ([place])   . Pneumonia     Social History   Socioeconomic History  . Marital status: Married    Spouse name: Not on file  . Number of children: 3  . Years of education: 10  . Highest education level: GED or equivalent  Occupational History  . Occupation: retired  Tobacco Use  . Smoking status: Never Smoker  . Smokeless tobacco: Never Used  Substance and Sexual Activity  . Alcohol use: No  . Drug use:  No  . Sexual activity: Yes  Other Topics Concern  . Not on file  Social History Narrative   Lives with wife.    3 children     6 grandchildren & 5 great grandchildren   Likes to fish    Social Determinants of Radio broadcast assistant Strain: Not on file  Food Insecurity: Not on file  Transportation Needs: Not on file  Physical Activity: Not on file  Stress: Not on file  Social Connections: Not on file  Intimate Partner Violence: Not on  file    Past Surgical History:  Procedure Laterality Date  . APPENDECTOMY  2000  . INGUINAL HERNIA REPAIR Bilateral   . LUMBAR LAMINECTOMY/DECOMPRESSION MICRODISCECTOMY Right 01/28/2020   Procedure: Microdiscectomy - right - Lumbar four-Lumbar five;  Surgeon: Kary Kos, MD;  Location: [facility];  Service: Neurosurgery;  Laterality: Right;  . SPINE SURGERY     spurs    Family History  Problem Relation Age of Onset  . Heart disease Father        Valve disease  . Kidney failure Mother     No Known Allergies  Current Outpatient Medications on File Prior to Visit  Medication Sig Dispense Refill  . aspirin 81 MG tablet Take 81 mg by mouth daily.    Marland Kitchen atorvastatin (LIPITOR) 10 MG tablet Take 10 mg by mouth at bedtime.    . Boswellia-Glucosamine-Vit D (OSTEO BI-FLEX ONE PER DAY PO) Take 1 tablet by mouth daily.    . diclofenac Sodium (VOLTAREN) 1 % GEL Apply 1 application topically 4 (four) times daily as needed (pain).    . DM-Doxylamine-Acetaminophen (NIGHT-TIME COLD/FLU RELIEF PO) Take 1 tablet by mouth at bedtime.    Marland Kitchen HYDROcodone-acetaminophen (NORCO/VICODIN) 5-325 MG tablet Take 1 tablet by mouth every 6 (six) hours as needed for moderate pain.    Marland Kitchen HYDROcodone-acetaminophen (NORCO/VICODIN) 5-325 MG tablet Take 1 tablet by mouth every 4 (four) hours as needed for moderate pain. 20 tablet 0  . levothyroxine (SYNTHROID) 88 MCG tablet Take 88 mcg by mouth daily before breakfast.    . lisinopril (PRINIVIL,ZESTRIL) 10 MG tablet Take 5 mg by mouth in the morning and at bedtime.     . methocarbamol (ROBAXIN) 500 MG tablet Take 500 mg by mouth 4 (four) times daily.    . methocarbamol (ROBAXIN) 500 MG tablet Take 1 tablet (500 mg total) by mouth 4 (four) times daily. 30 tablet 0  . methylPREDNISolone (MEDROL DOSEPAK) 4 MG TBPK tablet Take as directed (Patient taking differently: Take 4-24 mg by mouth See admin instructions. (Typical regimens for 21 tablet dose packs of Methylprednisolone 4mg ,  Prednisone 5mg , and Prednisone 10mg ) Day 1: 2 tabs before breakfast, 1 tab after lunch, 1 tab after supper, and 2 tabs at bedtime. Day 2: 1 tab before breakfast, 1 tab after lunch, 1 tab after supper, and 2 tabs at bedtime. Day 3: 1 tab before breakfast, 1 tab after lunch, 1 tab after supper, and 1 tab at bedtime. Day 4: 1 tab before breakfast, 1 tab after lunch, and 1 tab at bedtime. Day 5: 1 tab before breakfast and 1 tab at bedtime. Day 6: 1 tab before breakfast.) 21 tablet 0  . Multiple Vitamin (MULTIVITAMIN) tablet Take 1 tablet by mouth daily.    . naproxen sodium (ANAPROX) 220 MG tablet Take 220 mg by mouth daily as needed (pain).     . Omega-3 1000 MG CAPS Take 1,000 mg by mouth daily.    Marland Kitchen  omeprazole (PRILOSEC) 20 MG capsule Take 20 mg by mouth in the morning and at bedtime.    . Probiotic Product (PROBIOTIC DAILY PO) Take 1 capsule by mouth daily.     . psyllium (METAMUCIL) 58.6 % powder Take 1 packet by mouth daily as needed (constipation).     . sildenafil (VIAGRA) 100 MG tablet Take 100 mg by mouth as needed for erectile dysfunction.     . tamsulosin (FLOMAX) 0.4 MG CAPS capsule Take 0.4 mg by mouth at bedtime.    . vitamin C (ASCORBIC ACID) 500 MG tablet Take 500 mg by mouth daily.     No current facility-administered medications on file prior to visit.    There were no vitals taken for this visit.      Objective:   Physical Exam Vitals and nursing note reviewed.  Constitutional:      General: He is not in acute distress.    Appearance: Normal appearance. He is well-developed and normal weight.  HENT:     Head: Normocephalic and atraumatic.     Right Ear: Tympanic membrane, ear canal and external ear normal. There is no impacted cerumen.     Left Ear: Tympanic membrane, ear canal and external ear normal. There is no impacted cerumen.     Nose: Nose normal. No congestion or rhinorrhea.     Mouth/Throat:     Mouth: Mucous membranes are moist.     Pharynx: Oropharynx  is clear. No oropharyngeal exudate or posterior oropharyngeal erythema.  Eyes:     General:        Right eye: No discharge.        Left eye: No discharge.     Extraocular Movements: Extraocular movements intact.     Conjunctiva/sclera: Conjunctivae normal.     Pupils: Pupils are equal, round, and reactive to light.  Neck:     Vascular: No carotid bruit.     Trachea: No tracheal deviation.  Cardiovascular:     Rate and Rhythm: Normal rate and regular rhythm.     Pulses: Normal pulses.     Heart sounds: Normal heart sounds. No murmur heard. No friction rub. No gallop.   Pulmonary:     Effort: Pulmonary effort is normal. No respiratory distress.     Breath sounds: Normal breath sounds. No stridor. No wheezing, rhonchi or rales.  Chest:     Chest wall: No tenderness.  Abdominal:     General: Bowel sounds are normal. There is no distension.     Palpations: Abdomen is soft. There is no mass.     Tenderness: There is no abdominal tenderness. There is no right CVA tenderness, left CVA tenderness, guarding or rebound.     Hernia: No hernia is present.  Musculoskeletal:        General: No swelling, tenderness, deformity or signs of injury. Normal range of motion.     Right lower leg: No edema.     Left lower leg: No edema.  Lymphadenopathy:     Cervical: No cervical adenopathy.  Skin:    General: Skin is warm and dry.     Capillary Refill: Capillary refill takes less than 2 seconds.     Coloration: Skin is not jaundiced or pale.     Findings: No bruising, erythema, lesion or rash.  Neurological:     General: No focal deficit present.     Mental Status: He is alert and oriented to person, place, and time.     Cranial  Nerves: No cranial nerve deficit.     Sensory: No sensory deficit.     Motor: No weakness.     Coordination: Coordination normal.     Gait: Gait normal.     Deep Tendon Reflexes: Reflexes normal.  Psychiatric:        Mood and Affect: Mood normal.        Behavior:  Behavior normal.        Thought Content: Thought content normal.        Judgment: Judgment normal.       Assessment & Plan:

## 2020-11-18 DIAGNOSIS — M25552 Pain in left hip: Secondary | ICD-10-CM | POA: Diagnosis not present

## 2020-11-18 DIAGNOSIS — I10 Essential (primary) hypertension: Secondary | ICD-10-CM | POA: Diagnosis not present

## 2020-11-20 DIAGNOSIS — I10 Essential (primary) hypertension: Secondary | ICD-10-CM | POA: Diagnosis not present

## 2020-11-20 DIAGNOSIS — Z7984 Long term (current) use of oral hypoglycemic drugs: Secondary | ICD-10-CM | POA: Diagnosis not present

## 2020-11-20 DIAGNOSIS — E039 Hypothyroidism, unspecified: Secondary | ICD-10-CM | POA: Diagnosis not present

## 2020-11-20 DIAGNOSIS — E785 Hyperlipidemia, unspecified: Secondary | ICD-10-CM | POA: Diagnosis not present

## 2020-11-20 DIAGNOSIS — N529 Male erectile dysfunction, unspecified: Secondary | ICD-10-CM | POA: Diagnosis not present

## 2020-11-20 DIAGNOSIS — N400 Enlarged prostate without lower urinary tract symptoms: Secondary | ICD-10-CM | POA: Diagnosis not present

## 2020-11-20 DIAGNOSIS — Z791 Long term (current) use of non-steroidal anti-inflammatories (NSAID): Secondary | ICD-10-CM | POA: Diagnosis not present

## 2020-11-20 DIAGNOSIS — E119 Type 2 diabetes mellitus without complications: Secondary | ICD-10-CM | POA: Diagnosis not present

## 2020-11-20 DIAGNOSIS — Z7982 Long term (current) use of aspirin: Secondary | ICD-10-CM | POA: Diagnosis not present

## 2020-11-20 DIAGNOSIS — Z809 Family history of malignant neoplasm, unspecified: Secondary | ICD-10-CM | POA: Diagnosis not present

## 2020-11-28 IMAGING — DX RIGHT ANKLE - COMPLETE 3+ VIEW
3 series · 3 of 3 positions shown · non-contrast
Comparison: None.

CLINICAL DATA: Pain along talus region

EXAM:
RIGHT ANKLE - COMPLETE 3+ VIEW

[ankle ap]
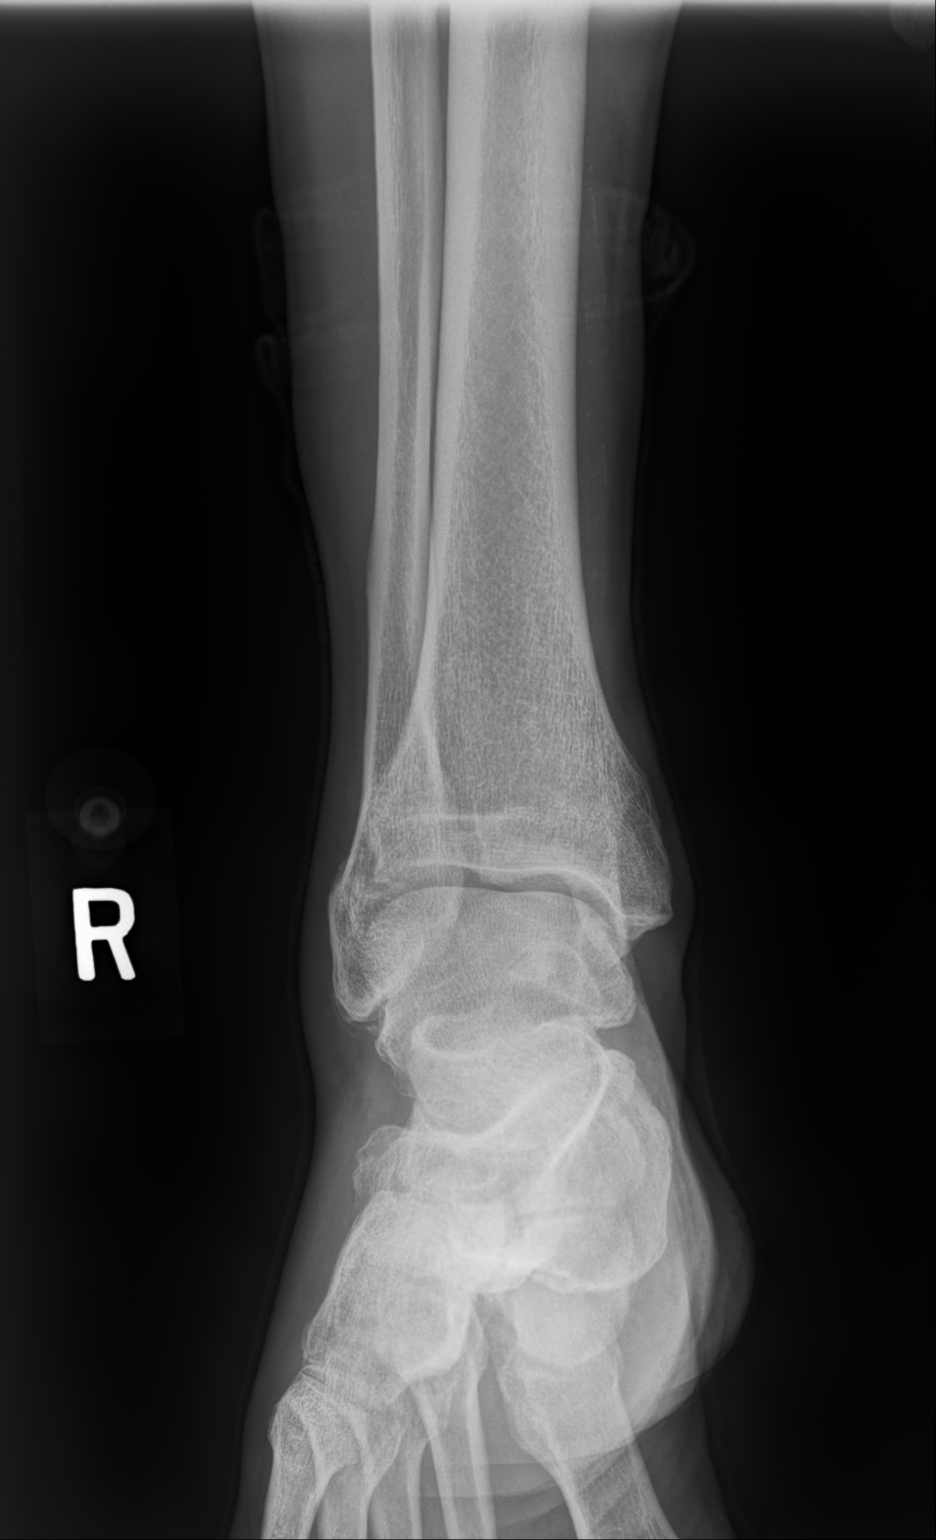

[ankle mlo]
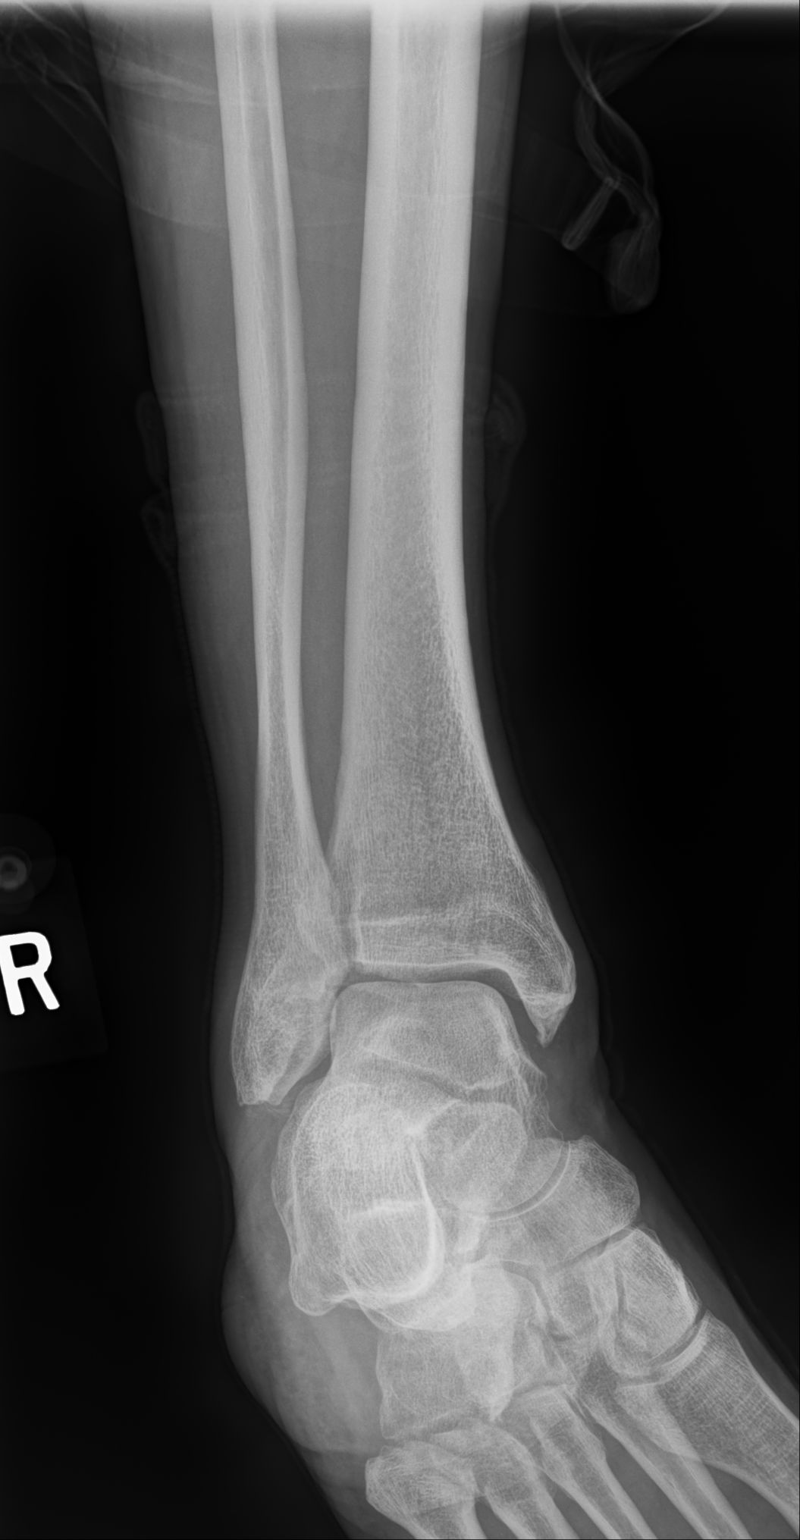

[ankle lat]
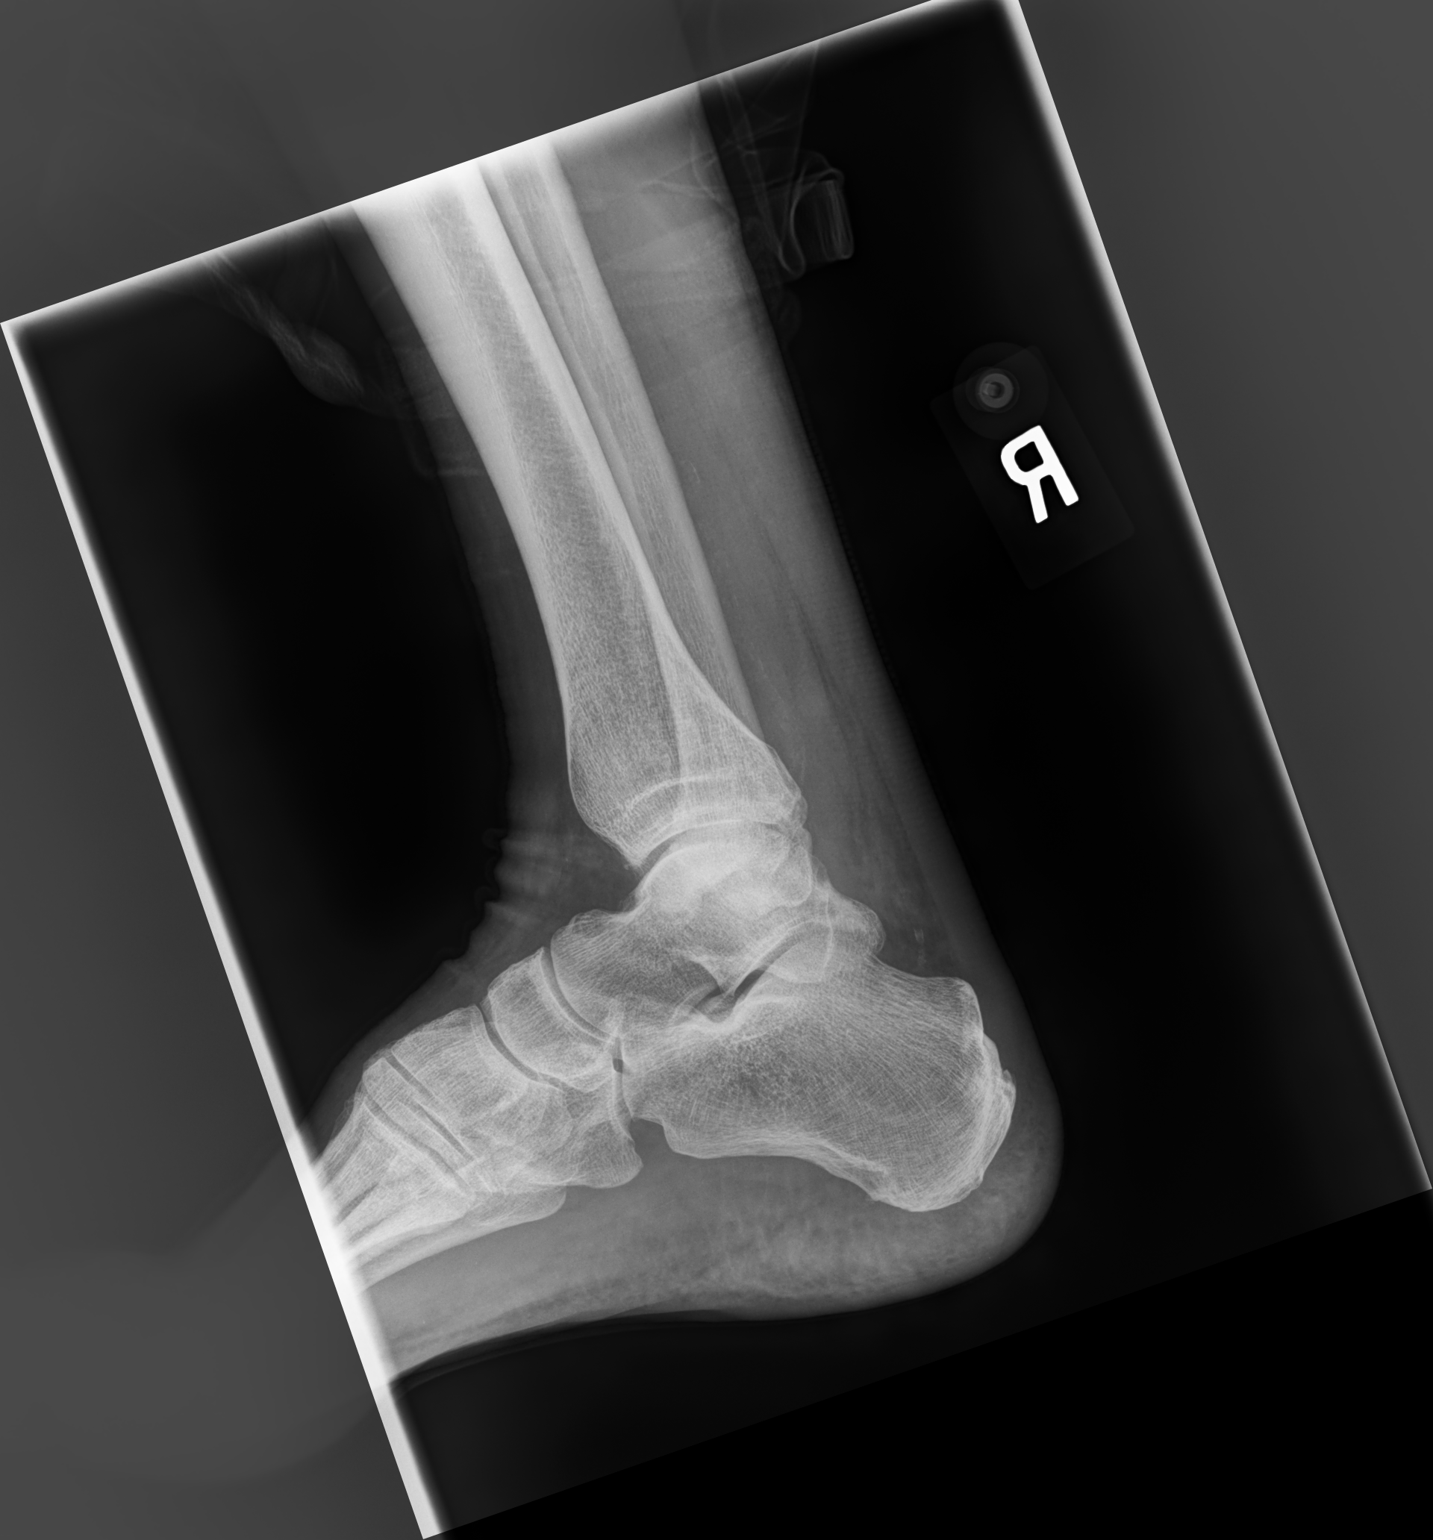

[3 of 3 positions shown; findings below may reference images not displayed]

FINDINGS: Frontal, oblique, and lateral views were obtained. Calcification in
the lateral malleolar region may represent residua of prior trauma.
There is no demonstrable acute fracture or joint effusion. There is
no appreciable joint space narrowing or erosion. There is no
evidence of osteonecrosis. Ankle mortise appears intact.
IMPRESSION: Calcification in the lateral malleolus may represent residua of
prior trauma. No acute appearing fracture evident. No appreciable
joint space narrowing or erosion. The talar dome region appears
normal. Ankle mortise appears grossly intact.

## 2020-12-04 ENCOUNTER — Encounter: Payer: Self-pay | Admitting: Adult Health

## 2020-12-04 ENCOUNTER — Other Ambulatory Visit: Payer: Self-pay

## 2020-12-04 ENCOUNTER — Ambulatory Visit (INDEPENDENT_AMBULATORY_CARE_PROVIDER_SITE_OTHER): Payer: Medicare HMO | Admitting: Adult Health

## 2020-12-04 VITALS — BP 130/60 | HR 60 | Temp 98.6°F | Ht 67.0 in | Wt 148.6 lb

## 2020-12-04 DIAGNOSIS — R1032 Left lower quadrant pain: Secondary | ICD-10-CM | POA: Diagnosis not present

## 2020-12-04 NOTE — Progress Notes (Signed)
Subjective:    Patient ID: Todd Watson, male    DOB: 12-May-1941, 80 y.o.   MRN: [id number]  HPI 80 year old male who  has a past medical history of Arthritis, GERD (gastroesophageal reflux disease), Herpes zoster, Hiatal hernia, Hypothyroidism, and Osteoarthritis of lumbar spine.  He presents to the office today for let groin pain that has been present off and on for the last 2-3 weeks. Reports that pain is worse when he is using his bobcat and steps on the gas. Reports no longer having pain but wants to makes sure he does not have a hernia   Has been using Motrin PRN    Review of Systems See HPI   Past Medical History:  Diagnosis Date  . Arthritis   . GERD (gastroesophageal reflux disease)   . Herpes zoster   . Hiatal hernia   . Hypothyroidism   . Osteoarthritis of lumbar spine     Social History   Socioeconomic History  . Marital status: Married    Spouse name: Not on file  . Number of children: 3  . Years of education: 10  . Highest education level: GED or equivalent  Occupational History  . Occupation: retired  Tobacco Use  . Smoking status: Never Smoker  . Smokeless tobacco: Never Used  Substance and Sexual Activity  . Alcohol use: No  . Drug use: No  . Sexual activity: Yes  Other Topics Concern  . Not on file  Social History Narrative   Lives with wife.    3 children     6 grandchildren & 5 great grandchildren   Likes to fish    Social Determinants of Radio broadcast assistant Strain: Not on file  Food Insecurity: Not on file  Transportation Needs: Not on file  Physical Activity: Not on file  Stress: Not on file  Social Connections: Not on file  Intimate Partner Violence: Not on file    Past Surgical History:  Procedure Laterality Date  . APPENDECTOMY  2000  . INGUINAL HERNIA REPAIR Bilateral   . LUMBAR LAMINECTOMY/DECOMPRESSION MICRODISCECTOMY Right 01/28/2020   Procedure: Microdiscectomy - right - Lumbar four-Lumbar five;  Surgeon: Kary Kos, MD;  Location: [facility];  Service: Neurosurgery;  Laterality: Right;  . SPINE SURGERY     spurs    Family History  Problem Relation Age of Onset  . Heart disease Father        Valve disease  . Kidney failure Mother     No Known Allergies  Current Outpatient Medications on File Prior to Visit  Medication Sig Dispense Refill  . aspirin 81 MG tablet Take 81 mg by mouth daily.    Marland Kitchen atorvastatin (LIPITOR) 10 MG tablet Take 10 mg by mouth at bedtime.    . Boswellia-Glucosamine-Vit D (OSTEO BI-FLEX ONE PER DAY PO) Take 1 tablet by mouth daily.    . diclofenac Sodium (VOLTAREN) 1 % GEL Apply 1 application topically 4 (four) times daily as needed (pain).    Marland Kitchen levothyroxine (SYNTHROID) 88 MCG tablet Take 88 mcg by mouth daily before breakfast.    . lisinopril (PRINIVIL,ZESTRIL) 10 MG tablet Take 5 mg by mouth in the morning and at bedtime.     . Magnesium 250 MG TABS Take by mouth daily.    . metFORMIN (GLUCOPHAGE) 500 MG tablet Take by mouth daily with breakfast.    . Multiple Vitamin (MULTIVITAMIN) tablet Take 1 tablet by mouth daily.    . naproxen  sodium (ANAPROX) 220 MG tablet Take 220 mg by mouth daily as needed (pain).     . Omega-3 1000 MG CAPS Take 1,000 mg by mouth daily.    Marland Kitchen omeprazole (PRILOSEC) 20 MG capsule Take 20 mg by mouth in the morning and at bedtime.    . Probiotic Product (PROBIOTIC DAILY PO) Take 1 capsule by mouth daily.     . psyllium (METAMUCIL) 58.6 % powder Take 1 packet by mouth daily as needed (constipation).     . sildenafil (VIAGRA) 100 MG tablet Take 100 mg by mouth as needed for erectile dysfunction.     . tamsulosin (FLOMAX) 0.4 MG CAPS capsule Take 0.4 mg by mouth at bedtime.    . vitamin C (ASCORBIC ACID) 500 MG tablet Take 500 mg by mouth daily.     No current facility-administered medications on file prior to visit.    BP 130/60 (BP Location: Left Arm, Patient Position: Sitting, Cuff Size: Normal)   Pulse 60   Temp 98.6 F (37 C) (Oral)   Ht 5'  7" (1.702 m)   Wt 148 lb 9.6 oz (67.4 kg)   SpO2 95%   BMI 23.27 kg/m       Objective:   Physical Exam Vitals and nursing note reviewed.  Constitutional:      Appearance: Normal appearance.  Abdominal:     General: Abdomen is flat.     Palpations: Abdomen is soft.     Hernia: No hernia is present.  Musculoskeletal:        General: No tenderness. Normal range of motion.  Skin:    General: Skin is warm and dry.  Neurological:     General: No focal deficit present.     Mental Status: He is alert and oriented to person, place, and time.  Psychiatric:        Mood and Affect: Mood normal.        Behavior: Behavior normal.        Assessment & Plan:  1. Groin pain, left - No hernia noted. No pain with palpation or stretching exercises - Reviewed stretching exercises to do for groin discomfort  - Follow up PRN   [name], NP

## 2020-12-10 ENCOUNTER — Other Ambulatory Visit: Payer: Self-pay

## 2020-12-11 ENCOUNTER — Ambulatory Visit (INDEPENDENT_AMBULATORY_CARE_PROVIDER_SITE_OTHER): Payer: Medicare HMO | Admitting: Adult Health

## 2020-12-11 ENCOUNTER — Encounter: Payer: Self-pay | Admitting: Adult Health

## 2020-12-11 VITALS — BP 138/86 | HR 70 | Temp 97.9°F | Resp 16 | Ht 67.0 in | Wt 158.0 lb

## 2020-12-11 DIAGNOSIS — M150 Primary generalized (osteo)arthritis: Secondary | ICD-10-CM

## 2020-12-11 DIAGNOSIS — E1169 Type 2 diabetes mellitus with other specified complication: Secondary | ICD-10-CM

## 2020-12-11 DIAGNOSIS — E039 Hypothyroidism, unspecified: Secondary | ICD-10-CM | POA: Diagnosis not present

## 2020-12-11 DIAGNOSIS — M8949 Other hypertrophic osteoarthropathy, multiple sites: Secondary | ICD-10-CM

## 2020-12-11 DIAGNOSIS — K219 Gastro-esophageal reflux disease without esophagitis: Secondary | ICD-10-CM

## 2020-12-11 DIAGNOSIS — I10 Essential (primary) hypertension: Secondary | ICD-10-CM | POA: Diagnosis not present

## 2020-12-11 DIAGNOSIS — Z0000 Encounter for general adult medical examination without abnormal findings: Secondary | ICD-10-CM

## 2020-12-11 DIAGNOSIS — M159 Polyosteoarthritis, unspecified: Secondary | ICD-10-CM

## 2020-12-11 DIAGNOSIS — E782 Mixed hyperlipidemia: Secondary | ICD-10-CM

## 2020-12-11 LAB — LIPID PANEL
Cholesterol: 144 mg/dL (ref 0–200)
HDL: 44.7 mg/dL (ref >39.00)
LDL Cholesterol: 84 mg/dL (ref 0–99)
NonHDL: 98.95
Total CHOL/HDL Ratio: 3
Triglycerides: 76 mg/dL (ref 0.0–149.0)
VLDL: 15.2 mg/dL (ref 0.0–40.0)

## 2020-12-11 LAB — CBC WITH DIFFERENTIAL/PLATELET
Basophils Absolute: 0 10*3/uL (ref 0.0–0.1)
Basophils Relative: 0.5 % (ref 0.0–3.0)
Eosinophils Absolute: 0.1 10*3/uL (ref 0.0–0.7)
Eosinophils Relative: 2.9 % (ref 0.0–5.0)
HCT: 40.8 % (ref 39.0–52.0)
Hemoglobin: 14 g/dL (ref 13.0–17.0)
Lymphocytes Relative: 30.3 % (ref 12.0–46.0)
Lymphs Abs: 1.6 10*3/uL (ref 0.7–4.0)
MCHC: 34.2 g/dL (ref 30.0–36.0)
MCV: 92.9 fl (ref 78.0–100.0)
Monocytes Absolute: 0.6 10*3/uL (ref 0.1–1.0)
Monocytes Relative: 12.4 % — ABNORMAL HIGH (ref 3.0–12.0)
Neutro Abs: 2.8 10*3/uL (ref 1.4–7.7)
Neutrophils Relative %: 53.9 % (ref 43.0–77.0)
Platelets: 185 10*3/uL (ref 150.0–400.0)
RBC: 4.4 Mil/uL (ref 4.22–5.81)
RDW: 13.7 % (ref 11.5–15.5)
WBC: 5.1 10*3/uL (ref 4.0–10.5)

## 2020-12-11 LAB — COMPREHENSIVE METABOLIC PANEL
ALT: 28 U/L (ref 0–53)
AST: 27 U/L (ref 0–37)
Albumin: 4.3 g/dL (ref 3.5–5.2)
Alkaline Phosphatase: 64 U/L (ref 39–117)
BUN: 18 mg/dL (ref 6–23)
CO2: 30 mEq/L (ref 19–32)
Calcium: 9.4 mg/dL (ref 8.4–10.5)
Chloride: 103 mEq/L (ref 96–112)
Creatinine, Ser: 0.9 mg/dL (ref 0.40–1.50)
GFR: 79.78 mL/min (ref >60.00)
Glucose, Bld: 124 mg/dL — ABNORMAL HIGH (ref 70–99)
Potassium: 4.8 mEq/L (ref 3.5–5.1)
Sodium: 140 mEq/L (ref 135–145)
Total Bilirubin: 0.9 mg/dL (ref 0.2–1.2)
Total Protein: 6.9 g/dL (ref 6.0–8.3)

## 2020-12-11 LAB — HEMOGLOBIN A1C: Hgb A1c MFr Bld: 6.9 % — ABNORMAL HIGH (ref 4.6–6.5)

## 2020-12-11 LAB — TSH: TSH: 2.17 u[IU]/mL (ref 0.35–4.50)

## 2020-12-11 NOTE — Patient Instructions (Signed)
It was great seeing you today   We will follow up with you regarding your labs   Keep doing what you are doing!   Let me know if you need anything

## 2020-12-11 NOTE — Progress Notes (Signed)
Subjective:    Patient ID: Todd Watson, male    DOB: 07/02/1941, 80 y.o.   MRN: [id number]  HPI  Patient presents for yearly preventative medicine examination. He is a very pleasant and remarkable 80 year old male who  has a past medical history of Arthritis, GERD (gastroesophageal reflux disease), Herpes zoster, Hiatal hernia, Hypothyroidism, and Osteoarthritis of lumbar spine.  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   He gets most of his care at the [facility] in [place]  Hyperlipidemia -only prescribed Lipitor 10 mg at bedtime and 81 mg of aspirin Lab Results  Component Value Date   CHOL 160 09/28/2018   HDL 41.10 09/28/2018   LDLCALC 92 09/28/2018   TRIG 133.0 09/28/2018   CHOLHDL 4 09/28/2018    DM-currently prescribed Metformin 500 mg daily at breakfast.  Denies hypoglycemic events.  Does not check his blood sugars on a routine basis Lab Results  Component Value Date   HGBA1C 6.9 (H) 01/28/2020    HTN-prescribed lisinopril 5 mg in the morning and 5 mg in the evening.  He denies dizziness, lightheadedness, chest pain, or shortness of breath  Hypothyroidism -currently prescribed Synthroid 88 mcg every morning.  Feels well controlled on this medication  BPH-ports being well controlled on Flomax 0.4 mg daily  Osteoarthritis- Mostly in his back and hips - takes anti-inflammatory medication as needed  All immunizations and health maintenance protocols were reviewed with the patient and needed orders were placed.  Appropriate screening laboratory values were ordered for the patient including screening of hyperlipidemia, renal function and hepatic function.  Medication reconciliation,  past medical history, social history, problem list and allergies were reviewed in detail with the patient  Goals were established with regard to weight loss, exercise, and  diet in compliance with medications  Wt Readings from Last 3 Encounters:   12/11/20 158 lb (71.7 kg)  12/04/20 148 lb 9.6 oz (67.4 kg)  08/27/20 159 lb 3.2 oz (72.2 kg)   End of life planning was discussed. He has an advanced directive and living will.   He denies any acute issues   Review of Systems  Constitutional: Negative.   HENT: Negative.   Eyes: Negative.   Respiratory: Negative.   Cardiovascular: Negative.   Gastrointestinal: Negative.   Endocrine: Negative.   Genitourinary: Negative.   Musculoskeletal: Positive for arthralgias and back pain.  Skin: Negative.   Allergic/Immunologic: Negative.   Neurological: Negative.   Hematological: Negative.   Psychiatric/Behavioral: Negative.   All other systems reviewed and are negative.  Past Medical History:  Diagnosis Date  . Arthritis   . GERD (gastroesophageal reflux disease)   . Herpes zoster   . Hiatal hernia   . Hypothyroidism   . Osteoarthritis of lumbar spine     Social History   Socioeconomic History  . Marital status: Married    Spouse name: Not on file  . Number of children: 3  . Years of education: 10  . Highest education level: GED or equivalent  Occupational History  . Occupation: retired  Tobacco Use  . Smoking status: Never Smoker  . Smokeless tobacco: Never Used  Substance and Sexual Activity  . Alcohol use: No  . Drug use: No  . Sexual activity: Yes  Other Topics Concern  . Not on file  Social History Narrative   Lives with wife.    3 children     6 grandchildren & 5 great grandchildren  Likes to fish    Social Determinants of Radio broadcast assistant Strain: Not on file  Food Insecurity: Not on file  Transportation Needs: Not on file  Physical Activity: Not on file  Stress: Not on file  Social Connections: Not on file  Intimate Partner Violence: Not on file    Past Surgical History:  Procedure Laterality Date  . APPENDECTOMY  2000  . INGUINAL HERNIA REPAIR Bilateral   . LUMBAR LAMINECTOMY/DECOMPRESSION MICRODISCECTOMY Right 01/28/2020    Procedure: Microdiscectomy - right - Lumbar four-Lumbar five;  Surgeon: Kary Kos, MD;  Location: [facility];  Service: Neurosurgery;  Laterality: Right;  . SPINE SURGERY     spurs    Family History  Problem Relation Age of Onset  . Heart disease Father        Valve disease  . Kidney failure Mother     No Known Allergies  Current Outpatient Medications on File Prior to Visit  Medication Sig Dispense Refill  . aspirin 81 MG tablet Take 81 mg by mouth daily.    Marland Kitchen atorvastatin (LIPITOR) 10 MG tablet Take 10 mg by mouth at bedtime.    . Boswellia-Glucosamine-Vit D (OSTEO BI-FLEX ONE PER DAY PO) Take 1 tablet by mouth daily.    . diclofenac Sodium (VOLTAREN) 1 % GEL Apply 1 application topically 4 (four) times daily as needed (pain).    Marland Kitchen levothyroxine (SYNTHROID) 88 MCG tablet Take 88 mcg by mouth daily before breakfast.    . lisinopril (PRINIVIL,ZESTRIL) 10 MG tablet Take 5 mg by mouth in the morning and at bedtime.     . Magnesium 250 MG TABS Take by mouth daily.    . metFORMIN (GLUCOPHAGE) 500 MG tablet Take by mouth daily with breakfast.    . Multiple Vitamin (MULTIVITAMIN) tablet Take 1 tablet by mouth daily.    . naproxen sodium (ANAPROX) 220 MG tablet Take 220 mg by mouth daily as needed (pain).     . Omega-3 1000 MG CAPS Take 1,000 mg by mouth daily.    Marland Kitchen omeprazole (PRILOSEC) 20 MG capsule Take 20 mg by mouth in the morning and at bedtime.    . Probiotic Product (PROBIOTIC DAILY PO) Take 1 capsule by mouth daily.     . psyllium (METAMUCIL) 58.6 % powder Take 1 packet by mouth daily as needed (constipation).     . sildenafil (VIAGRA) 100 MG tablet Take 100 mg by mouth as needed for erectile dysfunction.     . tamsulosin (FLOMAX) 0.4 MG CAPS capsule Take 0.4 mg by mouth at bedtime.    . vitamin C (ASCORBIC ACID) 500 MG tablet Take 500 mg by mouth daily.     No current facility-administered medications on file prior to visit.    BP 138/86 (BP Location: Left Arm, Patient Position:  Sitting, Cuff Size: Small)   Pulse 70   Temp 97.9 F (36.6 C) (Oral)   Resp 16   Ht 5\' 7"  (1.702 m)   Wt 158 lb (71.7 kg)   SpO2 98%   BMI 24.75 kg/m       Objective:   Physical Exam Vitals and nursing note reviewed.  Constitutional:      General: He is not in acute distress.    Appearance: Normal appearance. He is well-developed and normal weight.  HENT:     Head: Normocephalic and atraumatic.     Right Ear: Tympanic membrane, ear canal and external ear normal. There is no impacted cerumen.  Left Ear: Tympanic membrane, ear canal and external ear normal. There is no impacted cerumen.     Nose: Nose normal. No congestion or rhinorrhea.     Mouth/Throat:     Mouth: Mucous membranes are moist.     Pharynx: Oropharynx is clear. No oropharyngeal exudate or posterior oropharyngeal erythema.  Eyes:     General:        Right eye: No discharge.        Left eye: No discharge.     Extraocular Movements: Extraocular movements intact.     Conjunctiva/sclera: Conjunctivae normal.     Pupils: Pupils are equal, round, and reactive to light.  Neck:     Vascular: No carotid bruit.     Trachea: No tracheal deviation.  Cardiovascular:     Rate and Rhythm: Normal rate and regular rhythm.     Pulses: Normal pulses.     Heart sounds: Normal heart sounds. No murmur heard. No friction rub. No gallop.   Pulmonary:     Effort: Pulmonary effort is normal. No respiratory distress.     Breath sounds: Normal breath sounds. No stridor. No wheezing, rhonchi or rales.  Chest:     Chest wall: No tenderness.  Abdominal:     General: Bowel sounds are normal. There is no distension.     Palpations: Abdomen is soft. There is no mass.     Tenderness: There is no abdominal tenderness. There is no right CVA tenderness, left CVA tenderness, guarding or rebound.     Hernia: No hernia is present.  Musculoskeletal:        General: No swelling, tenderness, deformity or signs of injury. Normal range of  motion.     Right lower leg: No edema.     Left lower leg: No edema.  Lymphadenopathy:     Cervical: No cervical adenopathy.  Skin:    General: Skin is warm and dry.     Capillary Refill: Capillary refill takes less than 2 seconds.     Coloration: Skin is not jaundiced or pale.     Findings: No bruising, erythema, lesion or rash.  Neurological:     General: No focal deficit present.     Mental Status: He is alert and oriented to person, place, and time.     Cranial Nerves: No cranial nerve deficit.     Sensory: No sensory deficit.     Motor: No weakness.     Coordination: Coordination normal.     Gait: Gait normal.     Deep Tendon Reflexes: Reflexes normal.  Psychiatric:        Mood and Affect: Mood normal.        Behavior: Behavior normal.        Thought Content: Thought content normal.        Judgment: Judgment normal.       Assessment & Plan:  1. Routine general medical examination at a health care facility - Continue to stay active and eat healthy  - Follow up in one year or sooner if needed - [facility]  - CBC with Differential/Platelet; Future - Comprehensive metabolic panel; Future - Lipid panel; Future - TSH; Future - Hemoglobin A1c; Future - Hemoglobin A1c - TSH - Lipid panel - Comprehensive metabolic panel - CBC with Differential/Platelet  2. Gastroesophageal reflux disease without esophagitis - Continue with PPI   3. Mixed hyperlipidemia - Continue with lipitor  - CBC with Differential/Platelet; Future - Comprehensive metabolic  panel; Future - Lipid panel; Future - TSH; Future - Hemoglobin A1c; Future - Hemoglobin A1c - TSH - Lipid panel - Comprehensive metabolic panel - CBC with Differential/Platelet  4. Essential hypertension, benign - BP well controlled. No change in medications  - CBC with Differential/Platelet; Future - Comprehensive metabolic panel; Future - Lipid panel; Future - TSH; Future - Hemoglobin A1c;  Future - Hemoglobin A1c - TSH - Lipid panel - Comprehensive metabolic panel - CBC with Differential/Platelet  5. Hypothyroidism, unspecified type - Consider increase in synthroid  - CBC with Differential/Platelet; Future - Comprehensive metabolic panel; Future - Lipid panel; Future - TSH; Future - Hemoglobin A1c; Future - Hemoglobin A1c - TSH - Lipid panel - Comprehensive metabolic panel - CBC with Differential/Platelet  6. Type 2 diabetes mellitus with other specified complication, without long-term current use of insulin ([place]) - Consider increasing Metformin  - CBC with Differential/Platelet; Future - Comprehensive metabolic panel; Future - Lipid panel; Future - TSH; Future - Hemoglobin A1c; Future - Hemoglobin A1c - TSH - Lipid panel - Comprehensive metabolic panel - CBC with Differential/Platelet  7. Primary osteoarthritis involving multiple joints - Seems to be well controlled with NSAIDS   [name], NP

## 2020-12-17 ENCOUNTER — Telehealth: Payer: Self-pay | Admitting: Adult Health

## 2020-12-17 NOTE — Telephone Encounter (Signed)
Patient called back for lab results

## 2020-12-17 NOTE — Telephone Encounter (Signed)
I spoke with patient and gave lab results.  Nina,cma

## 2021-02-11 DIAGNOSIS — R1032 Left lower quadrant pain: Secondary | ICD-10-CM | POA: Diagnosis not present

## 2021-02-11 DIAGNOSIS — S76212S Strain of adductor muscle, fascia and tendon of left thigh, sequela: Secondary | ICD-10-CM | POA: Diagnosis not present

## 2021-02-11 DIAGNOSIS — R030 Elevated blood-pressure reading, without diagnosis of hypertension: Secondary | ICD-10-CM | POA: Diagnosis not present

## 2021-03-12 DIAGNOSIS — R8271 Bacteriuria: Secondary | ICD-10-CM | POA: Diagnosis not present

## 2021-03-12 DIAGNOSIS — R3912 Poor urinary stream: Secondary | ICD-10-CM | POA: Diagnosis not present

## 2021-03-12 DIAGNOSIS — N401 Enlarged prostate with lower urinary tract symptoms: Secondary | ICD-10-CM | POA: Diagnosis not present

## 2021-03-12 DIAGNOSIS — N451 Epididymitis: Secondary | ICD-10-CM | POA: Diagnosis not present

## 2021-03-12 DIAGNOSIS — R972 Elevated prostate specific antigen [PSA]: Secondary | ICD-10-CM | POA: Diagnosis not present

## 2021-03-12 DIAGNOSIS — Z125 Encounter for screening for malignant neoplasm of prostate: Secondary | ICD-10-CM | POA: Diagnosis not present

## 2021-03-12 LAB — PSA: PSA: 3.56

## 2021-03-17 ENCOUNTER — Telehealth: Payer: Self-pay | Admitting: Adult Health

## 2021-03-17 NOTE — Telephone Encounter (Signed)
Left message for patient to call back and schedule Medicare Annual Wellness Visit (AWV) either virtually or in office.   Last AWV 10/12/19 please schedule at anytime with [facility] Nurse Health Advisor 1 or 2   This should be a 45 minute visit.     Herrington, Dawn Charolette Forward, Robin B Hello,   Nafziger only did a physical/cpe, you can schedule.  Thank you, Dawn    Previous Messages    ----- Message -----  From: Darl Householder  Sent: 03/17/2021   7:34 AM EDT  To: Philbert Riser  Subject: awv refile 12/11/20 visit                       Good Morning  Can 12/11/20 visit be refile for awv or can I schedule patient with AWV

## 2021-04-20 DIAGNOSIS — D229 Melanocytic nevi, unspecified: Secondary | ICD-10-CM | POA: Diagnosis not present

## 2021-04-20 DIAGNOSIS — L821 Other seborrheic keratosis: Secondary | ICD-10-CM | POA: Diagnosis not present

## 2021-04-20 DIAGNOSIS — L578 Other skin changes due to chronic exposure to nonionizing radiation: Secondary | ICD-10-CM | POA: Diagnosis not present

## 2021-04-20 DIAGNOSIS — Z7189 Other specified counseling: Secondary | ICD-10-CM | POA: Diagnosis not present

## 2021-04-21 ENCOUNTER — Telehealth: Payer: Self-pay | Admitting: Adult Health

## 2021-04-21 NOTE — Telephone Encounter (Signed)
Tried calling patient to schedule Medicare Annual Wellness Visit (AWV) either virtually or in office.   Last AWV 10/12/19 ; please schedule at anytime with [facility] Nurse Health Advisor 1 or 2   This should be a 45 minute visit.   Herrington, Dawn Charolette Forward, Robin B Hello,   Nafziger only did a physical/cpe, you can schedule.  Thank you, Dawn         Previous Messages    ----- Message -----  From: Darl Householder  Sent: 03/17/2021   7:34 AM EDT  To: Philbert Riser  Subject: awv refile 12/11/20 visit                       Good Morning  Can 12/11/20 visit be refile for awv or can I schedule patient with AWV

## 2021-06-02 ENCOUNTER — Ambulatory Visit (INDEPENDENT_AMBULATORY_CARE_PROVIDER_SITE_OTHER): Payer: Medicare HMO

## 2021-06-02 DIAGNOSIS — Z0000 Encounter for general adult medical examination without abnormal findings: Secondary | ICD-10-CM

## 2021-06-02 NOTE — Patient Instructions (Signed)
Todd Watson , Thank you for taking time to come for your Medicare Wellness Visit. I appreciate your ongoing commitment to your health goals. Please review the following plan we discussed and let me know if I can assist you in the future.   Screening recommendations/referrals: Colonoscopy: no longer required  Recommended yearly ophthalmology/optometry visit for glaucoma screening and checkup Recommended yearly dental visit for hygiene and checkup  Vaccinations: Influenza vaccine: due in fall 2022 Pneumococcal vaccine: due second  Tdap vaccine: due upon injury  Shingles vaccine: completed series     Advanced directives: none   Conditions/risks identified: none   Next appointment: 06/23/2021  0900  [name]   Preventive Care 4 Years and Older, Male Preventive care refers to lifestyle choices and visits with your health care provider that can promote health and wellness. What does preventive care include? A yearly physical exam. This is also called an annual well check. Dental exams once or twice a year. Routine eye exams. Ask your health care provider how often you should have your eyes checked. Personal lifestyle choices, including: Daily care of your teeth and gums. Regular physical activity. Eating a healthy diet. Avoiding tobacco and drug use. Limiting alcohol use. Practicing safe sex. Taking low doses of aspirin every day. Taking vitamin and mineral supplements as recommended by your health care provider. What happens during an annual well check? The services and screenings done by your health care provider during your annual well check will depend on your age, overall health, lifestyle risk factors, and family history of disease. Counseling  Your health care provider may ask you questions about your: Alcohol use. Tobacco use. Drug use. Emotional well-being. Home and relationship well-being. Sexual activity. Eating habits. History of falls. Memory and ability to  understand (cognition). Work and work Statistician. Screening  You may have the following tests or measurements: Height, weight, and BMI. Blood pressure. Lipid and cholesterol levels. These may be checked every 5 years, or more frequently if you are over 57 years old. Skin check. Lung cancer screening. You may have this screening every year starting at age 61 if you have a 30-pack-year history of smoking and currently smoke or have quit within the past 15 years. Fecal occult blood test (FOBT) of the stool. You may have this test every year starting at age 44. Flexible sigmoidoscopy or colonoscopy. You may have a sigmoidoscopy every 5 years or a colonoscopy every 10 years starting at age 13. Prostate cancer screening. Recommendations will vary depending on your family history and other risks. Hepatitis C blood test. Hepatitis B blood test. Sexually transmitted disease (STD) testing. Diabetes screening. This is done by checking your blood sugar (glucose) after you have not eaten for a while (fasting). You may have this done every 1-3 years. Abdominal aortic aneurysm (AAA) screening. You may need this if you are a current or former smoker. Osteoporosis. You may be screened starting at age 53 if you are at high risk. Talk with your health care provider about your test results, treatment options, and if necessary, the need for more tests. Vaccines  Your health care provider may recommend certain vaccines, such as: Influenza vaccine. This is recommended every year. Tetanus, diphtheria, and acellular pertussis (Tdap, Td) vaccine. You may need a Td booster every 10 years. Zoster vaccine. You may need this after age 61. Pneumococcal 13-valent conjugate (PCV13) vaccine. One dose is recommended after age 53. Pneumococcal polysaccharide (PPSV23) vaccine. One dose is recommended after age 28. Talk to your  health care provider about which screenings and vaccines you need and how often you need them. This  information is not intended to replace advice given to you by your health care provider. Make sure you discuss any questions you have with your health care provider. Document Released: 10/17/2015 Document Revised: 06/09/2016 Document Reviewed: 07/22/2015 Elsevier Interactive Patient Education  2017 [place] Prevention in the Home Falls can cause injuries. They can happen to people of all ages. There are many things you can do to make your home safe and to help prevent falls. What can I do on the outside of my home? Regularly fix the edges of walkways and driveways and fix any cracks. Remove anything that might make you trip as you walk through a door, such as a raised step or threshold. Trim any bushes or trees on the path to your home. Use bright outdoor lighting. Clear any walking paths of anything that might make someone trip, such as rocks or tools. Regularly check to see if handrails are loose or broken. Make sure that both sides of any steps have handrails. Any raised decks and porches should have guardrails on the edges. Have any leaves, snow, or ice cleared regularly. Use sand or salt on walking paths during winter. Clean up any spills in your garage right away. This includes oil or grease spills. What can I do in the bathroom? Use night lights. Install grab bars by the toilet and in the tub and shower. Do not use towel bars as grab bars. Use non-skid mats or decals in the tub or shower. If you need to sit down in the shower, use a plastic, non-slip stool. Keep the floor dry. Clean up any water that spills on the floor as soon as it happens. Remove soap buildup in the tub or shower regularly. Attach bath mats securely with double-sided non-slip rug tape. Do not have throw rugs and other things on the floor that can make you trip. What can I do in the bedroom? Use night lights. Make sure that you have a light by your bed that is easy to reach. Do not use any sheets or  blankets that are too big for your bed. They should not hang down onto the floor. Have a firm chair that has side arms. You can use this for support while you get dressed. Do not have throw rugs and other things on the floor that can make you trip. What can I do in the kitchen? Clean up any spills right away. Avoid walking on wet floors. Keep items that you use a lot in easy-to-reach places. If you need to reach something above you, use a strong step stool that has a grab bar. Keep electrical cords out of the way. Do not use floor polish or wax that makes floors slippery. If you must use wax, use non-skid floor wax. Do not have throw rugs and other things on the floor that can make you trip. What can I do with my stairs? Do not leave any items on the stairs. Make sure that there are handrails on both sides of the stairs and use them. Fix handrails that are broken or loose. Make sure that handrails are as long as the stairways. Check any carpeting to make sure that it is firmly attached to the stairs. Fix any carpet that is loose or worn. Avoid having throw rugs at the top or bottom of the stairs. If you do have throw rugs, attach them  to the floor with carpet tape. Make sure that you have a light switch at the top of the stairs and the bottom of the stairs. If you do not have them, ask someone to add them for you. What else can I do to help prevent falls? Wear shoes that: Do not have high heels. Have rubber bottoms. Are comfortable and fit you well. Are closed at the toe. Do not wear sandals. If you use a stepladder: Make sure that it is fully opened. Do not climb a closed stepladder. Make sure that both sides of the stepladder are locked into place. Ask someone to hold it for you, if possible. Clearly mark and make sure that you can see: Any grab bars or handrails. First and last steps. Where the edge of each step is. Use tools that help you move around (mobility aids) if they are  needed. These include: Canes. Walkers. Scooters. Crutches. Turn on the lights when you go into a dark area. Replace any light bulbs as soon as they burn out. Set up your furniture so you have a clear path. Avoid moving your furniture around. If any of your floors are uneven, fix them. If there are any pets around you, be aware of where they are. Review your medicines with your doctor. Some medicines can make you feel dizzy. This can increase your chance of falling. Ask your doctor what other things that you can do to help prevent falls. This information is not intended to replace advice given to you by your health care provider. Make sure you discuss any questions you have with your health care provider. Document Released: 07/17/2009 Document Revised: 02/26/2016 Document Reviewed: 10/25/2014 Elsevier Interactive Patient Education  2017 [employer].

## 2021-06-02 NOTE — Progress Notes (Signed)
Subjective:   Todd Watson is a 80 y.o. male who presents for an Initial Medicare Annual Wellness Visit.   I connected with Oswaldo Conroy today by telephone and verified that I am speaking with the correct person using two identifiers. Location patient: home Location provider: work Persons participating in the virtual visit: patient, provider.   I discussed the limitations, risks, security and privacy concerns of performing an evaluation and management service by telephone and the availability of in person appointments. I also discussed with the patient that there may be a patient responsible charge related to this service. The patient expressed understanding and verbally consented to this telephonic visit.    Interactive audio and video telecommunications were attempted between this provider and patient, however failed, due to patient having technical difficulties OR patient did not have access to video capability.  We continued and completed visit with audio only.    Review of Systems    N/A       Objective:    There were no vitals filed for this visit. There is no height or weight on file to calculate BMI.  Advanced Directives 10/12/2019 09/29/2019 09/04/2018 08/09/2017  Does Patient Have a Medical Advance Directive? No No No No  Would patient like information on creating a medical advance directive? No - Patient declined Yes (ED - Information included in AVS) - Yes (MAU/Ambulatory/Procedural Areas - Information given)    Current Medications (verified) Outpatient Encounter Medications as of 06/02/2021  Medication Sig   aspirin 81 MG tablet Take 81 mg by mouth daily.   atorvastatin (LIPITOR) 10 MG tablet Take 10 mg by mouth at bedtime.   Boswellia-Glucosamine-Vit D (OSTEO BI-FLEX ONE PER DAY PO) Take 1 tablet by mouth daily.   diclofenac Sodium (VOLTAREN) 1 % GEL Apply 1 application topically 4 (four) times daily as needed (pain).   levothyroxine (SYNTHROID) 88 MCG tablet Take  88 mcg by mouth daily before breakfast.   lisinopril (PRINIVIL,ZESTRIL) 10 MG tablet Take 5 mg by mouth in the morning and at bedtime.    Magnesium 250 MG TABS Take by mouth daily.   metFORMIN (GLUCOPHAGE) 500 MG tablet Take by mouth daily with breakfast.   Multiple Vitamin (MULTIVITAMIN) tablet Take 1 tablet by mouth daily.   naproxen sodium (ANAPROX) 220 MG tablet Take 220 mg by mouth daily as needed (pain).    Omega-3 1000 MG CAPS Take 1,000 mg by mouth daily.   omeprazole (PRILOSEC) 20 MG capsule Take 20 mg by mouth in the morning and at bedtime.   Probiotic Product (PROBIOTIC DAILY PO) Take 1 capsule by mouth daily.    psyllium (METAMUCIL) 58.6 % powder Take 1 packet by mouth daily as needed (constipation).    sildenafil (VIAGRA) 100 MG tablet Take 100 mg by mouth as needed for erectile dysfunction.    tamsulosin (FLOMAX) 0.4 MG CAPS capsule Take 0.4 mg by mouth at bedtime.   vitamin C (ASCORBIC ACID) 500 MG tablet Take 500 mg by mouth daily.   No facility-administered encounter medications on file as of 06/02/2021.    Allergies (verified) Patient has no known allergies.   History: Past Medical History:  Diagnosis Date   Arthritis    GERD (gastroesophageal reflux disease)    Herpes zoster    Hiatal hernia    Hypothyroidism    Osteoarthritis of lumbar spine    Past Surgical History:  Procedure Laterality Date   APPENDECTOMY  2000   INGUINAL HERNIA REPAIR Bilateral  LUMBAR LAMINECTOMY/DECOMPRESSION MICRODISCECTOMY Right 01/28/2020   Procedure: Microdiscectomy - right - Lumbar four-Lumbar five;  Surgeon: Kary Kos, MD;  Location: [facility];  Service: Neurosurgery;  Laterality: Right;   SPINE SURGERY     spurs   Family History  Problem Relation Age of Onset   Heart disease Father        Valve disease   Kidney failure Mother    Social History   Socioeconomic History   Marital status: Married    Spouse name: Not on file   Number of children: 3   Years of education: 10    Highest education level: GED or equivalent  Occupational History   Occupation: retired  Tobacco Use   Smoking status: Never   Smokeless tobacco: Never  Substance and Sexual Activity   Alcohol use: No   Drug use: No   Sexual activity: Yes  Other Topics Concern   Not on file  Social History Narrative   Lives with wife.    3 children     6 grandchildren & 5 great grandchildren   Likes to fish    Social Determinants of Radio broadcast assistant Strain: Not on file  Food Insecurity: Not on file  Transportation Needs: Not on file  Physical Activity: Not on file  Stress: Not on file  Social Connections: Not on file    Tobacco Counseling Counseling given: Not Answered   Clinical Intake:                 Diabetic?yes Nutrition Risk Assessment:  Has the patient had any N/V/D within the last 2 months?  No  Does the patient have any non-healing wounds?  No  Has the patient had any unintentional weight loss or weight gain?  No   Diabetes:  Is the patient diabetic?  Yes  If diabetic, was a CBG obtained today?  No  Did the patient bring in their glucometer from home?  No  How often do you monitor your CBG's? Daily .   Financial Strains and Diabetes Management:  Are you having any financial strains with the device, your supplies or your medication? No .  Does the patient want to be seen by Chronic Care Management for management of their diabetes?  No  Would the patient like to be referred to a Nutritionist or for Diabetic Management?  No   Diabetic Exams:  Diabetic Eye Exam: Completed 11/2020  [facility] Diabetic Foot Exam: Overdue, Pt has been advised about the importance in completing this exam. Pt is scheduled for diabetic foot exam on next office visit .          Activities of Daily Living In your present state of health, do you have any difficulty performing the following activities: 12/11/2020  Hearing? N  Vision? N  Difficulty concentrating or making  decisions? N  Walking or climbing stairs? N  Dressing or bathing? N  Doing errands, shopping? N  Some recent data might be hidden    Patient Care Team: [name], NP as PCP - General (Family Medicine) Kary Kos, MD as Consulting Physician (Neurosurgery)  Indicate any recent Medical Services you may have received from other than [facility] providers in the past year (date may be approximate).     Assessment:   This is a routine wellness examination for Todd Watson.  Hearing/Vision screen No results found.  Dietary issues and exercise activities discussed:     Goals Addressed   None    Depression Screen [facility] 2/9  Scores 09/04/2018 08/09/2017 04/12/2017 07/31/2015 07/23/2014  PHQ - 2 Score 0 0 0 0 0    Fall Risk Fall Risk  10/12/2019 09/04/2018 08/09/2017 04/12/2017 07/31/2015  Falls in the past year? 0 0 No No No  Risk for fall due to : Medication side effect - - - -  Follow up Falls evaluation completed;Education provided;Falls prevention discussed - - - -    FALL RISK PREVENTION PERTAINING TO THE HOME:  Any stairs in or around the home? Yes  If so, are there any without handrails? No  Home free of loose throw rugs in walkways, pet beds, electrical cords, etc? Yes  Adequate lighting in your home to reduce risk of falls? Yes   ASSISTIVE DEVICES UTILIZED TO PREVENT FALLS:  Life alert? No  Use of a cane, walker or w/c? No  Grab bars in the bathroom? Yes  Shower chair or bench in shower? Yes  Elevated toilet seat or a handicapped toilet? No    Cognitive Function: Normal cognitive status assessed by direct observation by this Nurse Health Advisor. No abnormalities found.   MMSE - Mini Mental State Exam 09/04/2018  Not completed: (No Data)        Immunizations Immunization History  Administered Date(s) Administered   Fluad Quad(high Dose 65+) 06/27/2020   Influenza, High Dose Seasonal PF 07/31/2015, 07/04/2017   Influenza,inj,Quad PF,6+ Mos 07/04/2014, 08/05/2019    Influenza-Unspecified 08/21/2018   Moderna Sars-Covid-2 Vaccination 02/04/2020, 03/03/2020, 11/06/2020   Pneumococcal Conjugate-13 07/04/2014   Zoster Recombinat (Shingrix) 04/03/2017, 07/04/2017    TDAP status: Up to date  Flu Vaccine status: Up to date  Pneumococcal vaccine status: Up to date  Covid-19 vaccine status: Completed vaccines  Qualifies for Shingles Vaccine? Yes   Zostavax completed No   Shingrix Completed?: Yes  Screening Tests Health Maintenance  Topic Date Due   TETANUS/TDAP  Never done   PNA vac Low Risk Adult (2 of 2 - PPSV23) 07/05/2015   OPHTHALMOLOGY EXAM  12/04/2020   COVID-19 Vaccine (4 - Booster for Moderna series) 02/03/2021   INFLUENZA VACCINE  05/04/2021   HEMOGLOBIN A1C  06/13/2021   FOOT EXAM  12/11/2021   Zoster Vaccines- Shingrix  Completed   HPV VACCINES  Aged Out    Health Maintenance  Health Maintenance Due  Topic Date Due   TETANUS/TDAP  Never done   PNA vac Low Risk Adult (2 of 2 - PPSV23) 07/05/2015   OPHTHALMOLOGY EXAM  12/04/2020   COVID-19 Vaccine (4 - Booster for Moderna series) 02/03/2021   INFLUENZA VACCINE  05/04/2021    Colorectal cancer screening: No longer required.   Lung Cancer Screening: (Low Dose CT Chest recommended if Age 82-80 years, 30 pack-year currently smoking OR have quit w/in 15years.) does not qualify.   Lung Cancer Screening Referral: n/a  Additional Screening:  Hepatitis C Screening: does not qualify  Vision Screening: Recommended annual ophthalmology exams for early detection of glaucoma and other disorders of the eye. Is the patient up to date with their annual eye exam?  Yes  Who is the provider or what is the name of the office in which the patient attends annual eye exams? [facility]  If pt is not established with a provider, would they like to be referred to a provider to establish care? No .   Dental Screening: Recommended annual dental exams for proper oral hygiene  Community  Resource Referral / Chronic Care Management: CRR required this visit?  No   CCM required this  visit?  No      Plan:     I have personally reviewed and noted the following in the patient's chart:   Medical and social history Use of alcohol, tobacco or illicit drugs  Current medications and supplements including opioid prescriptions. Patient is not currently taking opioid prescriptions. Functional ability and status Nutritional status Physical activity Advanced directives List of other physicians Hospitalizations, surgeries, and ER visits in previous 12 months Vitals Screenings to include cognitive, depression, and falls Referrals and appointments  In addition, I have reviewed and discussed with patient certain preventive protocols, quality metrics, and best practice recommendations. A written personalized care plan for preventive services as well as general preventive health recommendations were provided to patient.     Randel Pigg, LPN   [id number]   Nurse Notes: none

## 2021-06-19 ENCOUNTER — Telehealth: Payer: Self-pay | Admitting: Adult Health

## 2021-06-19 NOTE — Progress Notes (Signed)
  Chronic Care Management   Outreach Note  06/19/2021 Name: Todd Watson MRN: [id number] DOB: 1941/04/08  Referred by: [name], NP Reason for referral : No chief complaint on file.   An unsuccessful telephone outreach was attempted today. The patient was referred to the pharmacist for assistance with care management and care coordination.   Follow Up Plan:   Tatjana Dellinger Upstream Scheduler

## 2021-06-23 ENCOUNTER — Encounter: Payer: Self-pay | Admitting: Adult Health

## 2021-06-23 ENCOUNTER — Other Ambulatory Visit: Payer: Self-pay

## 2021-06-23 ENCOUNTER — Ambulatory Visit (INDEPENDENT_AMBULATORY_CARE_PROVIDER_SITE_OTHER): Payer: Medicare HMO | Admitting: Adult Health

## 2021-06-23 VITALS — BP 100/78 | HR 56 | Temp 98.0°F | Ht 67.0 in | Wt 159.0 lb

## 2021-06-23 DIAGNOSIS — E1169 Type 2 diabetes mellitus with other specified complication: Secondary | ICD-10-CM

## 2021-06-23 DIAGNOSIS — Z23 Encounter for immunization: Secondary | ICD-10-CM

## 2021-06-23 LAB — POCT GLYCOSYLATED HEMOGLOBIN (HGB A1C): Hemoglobin A1C: 6.3 % — AB (ref 4.0–5.6)

## 2021-06-23 NOTE — Progress Notes (Signed)
Subjective:    Patient ID: Todd Watson, male    DOB: [birth year], 80 y.o.   MRN: [id number]  HPI  80 year old male who  has a past medical history of Arthritis, GERD (gastroesophageal reflux disease), Herpes zoster, Hiatal hernia, Hypothyroidism, and Osteoarthritis of lumbar spine.  He presents to the office today for six month follow up regarding DM II. He is currently prescribed Metformin 500 mg daily. He has been well controlled on this dose. Does not check his blood sugars on a routine basis. Denies episodes of hypoglycemia.   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  Just got home [facility] where him and his wife stay during the summer.  Lab Results  Component Value Date   HGBA1C 6.9 (H) 12/11/2020    Review of Systems Past Medical History:  Diagnosis Date   Arthritis    GERD (gastroesophageal reflux disease)    Herpes zoster    Hiatal hernia    Hypothyroidism    Osteoarthritis of lumbar spine     Social History   Socioeconomic History   Marital status: Married    Spouse name: Not on file   Number of children: 3   Years of education: 10   Highest education level: GED or equivalent  Occupational History   Occupation: retired  Tobacco Use   Smoking status: Never   Smokeless tobacco: Never  Substance and Sexual Activity   Alcohol use: No   Drug use: No   Sexual activity: Yes  Other Topics Concern   Not on file  Social History Narrative   Lives with wife.    3 children     6 grandchildren & 5 great grandchildren   Likes to fish    Social Determinants of Radio broadcast assistant Strain: Low Risk    Difficulty of Paying Living Expenses: Not hard at all  Food Insecurity: No Food Insecurity   Worried About Charity fundraiser in the Last Year: Never true   Arboriculturist in the Last Year: Never true  Transportation Needs: No Transportation Needs   Lack of Transportation  (Medical): No   Lack of Transportation (Non-Medical): No  Physical Activity: Sufficiently Active   Days of Exercise per Week: 5 days   Minutes of Exercise per Session: 30 min  Stress: No Stress Concern Present   Feeling of Stress : Not at all  Social Connections: Socially Integrated   Frequency of Communication with Friends and Family: Three times a week   Frequency of Social Gatherings with Friends and Family: Three times a week   Attends Religious Services: 1 to 4 times per year   Active Member of Clubs or Organizations: Yes   Attends Music therapist: More than 4 times per year   Marital Status: Married  Human resources officer Violence: Not At Risk   Fear of Current or Ex-Partner: No   Emotionally Abused: No   Physically Abused: No   Sexually Abused: No    Past Surgical History:  Procedure Laterality Date   APPENDECTOMY  2000   INGUINAL HERNIA REPAIR Bilateral    LUMBAR LAMINECTOMY/DECOMPRESSION MICRODISCECTOMY Right 01/28/2020   Procedure: Microdiscectomy - right - Lumbar four-Lumbar five;  Surgeon: Kary Kos, MD;  Location: [place];  Service: Neurosurgery;  Laterality: Right;   SPINE SURGERY     spurs    Family History  Problem Relation  Age of Onset   Heart disease Father        Valve disease   Kidney failure Mother     No Known Allergies  Current Outpatient Medications on File Prior to Visit  Medication Sig Dispense Refill   aspirin 81 MG tablet Take 81 mg by mouth daily.     atorvastatin (LIPITOR) 10 MG tablet Take 10 mg by mouth at bedtime.     Boswellia-Glucosamine-Vit D (OSTEO BI-FLEX ONE PER DAY PO) Take 1 tablet by mouth daily.     diclofenac Sodium (VOLTAREN) 1 % GEL Apply 1 application topically 4 (four) times daily as needed (pain).     levothyroxine (SYNTHROID) 88 MCG tablet Take 88 mcg by mouth daily before breakfast.     lisinopril (PRINIVIL,ZESTRIL) 10 MG tablet Take 5 mg by mouth in the morning and at bedtime.      Magnesium 250 MG TABS Take by  mouth daily.     metFORMIN (GLUCOPHAGE) 500 MG tablet Take by mouth daily with breakfast.     Multiple Vitamin (MULTIVITAMIN) tablet Take 1 tablet by mouth daily.     naproxen sodium (ANAPROX) 220 MG tablet Take 220 mg by mouth daily as needed (pain).      Omega-3 1000 MG CAPS Take 1,000 mg by mouth daily.     omeprazole (PRILOSEC) 20 MG capsule Take 20 mg by mouth in the morning and at bedtime.     Probiotic Product (PROBIOTIC DAILY PO) Take 1 capsule by mouth daily.      psyllium (METAMUCIL) 58.6 % powder Take 1 packet by mouth daily as needed (constipation).      sildenafil (VIAGRA) 100 MG tablet Take 100 mg by mouth as needed for erectile dysfunction.      tamsulosin (FLOMAX) 0.4 MG CAPS capsule Take 0.4 mg by mouth at bedtime.     vitamin C (ASCORBIC ACID) 500 MG tablet Take 500 mg by mouth daily.     No current facility-administered medications on file prior to visit.    BP 100/78   Pulse (!) 56   Temp 98 F (36.7 C) (Oral)   Ht 5\' 7"  (1.702 m)   Wt 159 lb (72.1 kg)   SpO2 98%   BMI 24.90 kg/m       Objective:   Physical Exam Vitals and nursing note reviewed.  Constitutional:      Appearance: Normal appearance.  Cardiovascular:     Rate and Rhythm: Normal rate and regular rhythm.     Pulses: Normal pulses.     Heart sounds: Normal heart sounds.  Pulmonary:     Effort: Pulmonary effort is normal.     Breath sounds: Normal breath sounds.  Skin:    General: Skin is warm and dry.  Neurological:     General: No focal deficit present.     Mental Status: He is alert and oriented to person, place, and time.  Psychiatric:        Mood and Affect: Mood normal.        Behavior: Behavior normal.        Thought Content: Thought content normal.        Judgment: Judgment normal.      Assessment & Plan:  1. Type 2 diabetes mellitus with other specified complication, without long-term current use of insulin (HCC)  - POC HgB A1c - 6.3  - Controlled. No change in medications   - He gets his medications filled at the [place]  2. Need  for immunization against influenza  - Flu Vaccine QUAD High Dose(Fluad)  [name], NP

## 2021-06-29 ENCOUNTER — Telehealth: Payer: Self-pay | Admitting: Adult Health

## 2021-06-29 NOTE — Chronic Care Management (AMB) (Signed)
  Chronic Care Management   Outreach Note  06/29/2021 Name: Todd Watson MRN: [id number] DOB: 22-May-1941  Referred by: [name], NP Reason for referral : No chief complaint on file.   A second unsuccessful telephone outreach was attempted today. The patient was referred to pharmacist for assistance with care management and care coordination.  Follow Up Plan:   Tatjana Dellinger Upstream Scheduler

## 2021-07-06 ENCOUNTER — Telehealth: Payer: Self-pay | Admitting: Adult Health

## 2021-07-06 NOTE — Progress Notes (Signed)
  Chronic Care Management   Note  07/06/2021 Name: AUDIEL SCHEIBER MRN: [id number] DOB: 1940-12-01  [place] Co Mennella is a 80 y.o. year old male who is a primary care patient of [name], NP. I reached out to Abigail Miyamoto by phone today in response to a referral sent by Mr. Jeffory Snelgrove Spikes's PCP, [name], NP.   Mr. Wigington was given information about Chronic Care Management services today including:  CCM service includes personalized support from designated clinical staff supervised by his physician, including individualized plan of care and coordination with other care providers 24/7 contact phone numbers for assistance for urgent and routine care needs. Service will only be billed when office clinical staff spend 20 minutes or more in a month to coordinate care. Only one practitioner may furnish and bill the service in a calendar month. The patient may stop CCM services at any time (effective at the end of the month) by phone call to the office staff.   Patient agreed to services and verbal consent obtained.   Follow up plan:   Tatjana Secretary/administrator

## 2021-08-24 ENCOUNTER — Telehealth: Payer: Self-pay | Admitting: Pharmacist

## 2021-08-24 NOTE — Chronic Care Management (AMB) (Signed)
Chronic Care Management Pharmacy Assistant   Name: Todd Watson  MRN: [id number] DOB: 10-Sep-1941  Todd Watson is an 80 y.o. year old male who presents for his initial CCM visit with the clinical pharmacist.  Reason for Encounter: Chart prep for initial visit with [name] the clinical pharmacist on 08/31/2021   Conditions to be addressed/monitored: HTN, HLD, DMII, GERD, and Hypothyroidism   Recent office visits:  06/23/2021 [name] NP - Patient was seen for Type 2 diabetes mellitus and an additional issue. No medication changes. No follow up noted.  06/02/2021 Braulio Conte Viers LPN - Medicare annual wellness exam   Recent consult visits:  None  Hospital visits:  None  Medications: Outpatient Encounter Medications as of 08/24/2021  Medication Sig   aspirin 81 MG tablet Take 81 mg by mouth daily.   atorvastatin (LIPITOR) 10 MG tablet Take 10 mg by mouth at bedtime.   Boswellia-Glucosamine-Vit D (OSTEO BI-FLEX ONE PER DAY PO) Take 1 tablet by mouth daily.   diclofenac Sodium (VOLTAREN) 1 % GEL Apply 1 application topically 4 (four) times daily as needed (pain).   levothyroxine (SYNTHROID) 88 MCG tablet Take 88 mcg by mouth daily before breakfast.   lisinopril (PRINIVIL,ZESTRIL) 10 MG tablet Take 5 mg by mouth in the morning and at bedtime.    Magnesium 250 MG TABS Take by mouth daily.   metFORMIN (GLUCOPHAGE) 500 MG tablet Take by mouth daily with breakfast.   Multiple Vitamin (MULTIVITAMIN) tablet Take 1 tablet by mouth daily.   naproxen sodium (ANAPROX) 220 MG tablet Take 220 mg by mouth daily as needed (pain).    Omega-3 1000 MG CAPS Take 1,000 mg by mouth daily.   omeprazole (PRILOSEC) 20 MG capsule Take 20 mg by mouth in the morning and at bedtime.   Probiotic Product (PROBIOTIC DAILY PO) Take 1 capsule by mouth daily.    psyllium (METAMUCIL) 58.6 % powder Take 1 packet by mouth daily as needed (constipation).    sildenafil (VIAGRA) 100 MG tablet Take 100 mg  by mouth as needed for erectile dysfunction.    tamsulosin (FLOMAX) 0.4 MG CAPS capsule Take 0.4 mg by mouth at bedtime.   vitamin C (ASCORBIC ACID) 500 MG tablet Take 500 mg by mouth daily.   No facility-administered encounter medications on file as of 08/24/2021.   Fill History: TAMSULOSIN HYDROCHLORIDE 0.4MG  CAP 03/12/2021 90   Have you seen any other providers since your last visit? **no  Any changes in your medications or health? no  Any side effects from any medications? no  Do you have an symptoms or problems not managed by your medications? ED but he will be seeing his Urologist to discuss this further.  Any concerns about your health right now? no  Has your provider asked that you check blood pressure, blood sugar, or follow special diet at home? No He tries to not eat sugars or foods with sugars.  Do you get any type of exercise on a regular basis? 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  Can you think of a goal you would like to reach for your health? No  Do you have any problems getting your medications? No, most of his meds are filled at the [place]  Is there anything that you would like to discuss during the appointment? No  Please bring medications and supplements to appointment  Care Gaps: AWV - complete 06/02/2021 Last BP - 100/78  on 06/23/2021 Last A1C - 6.3 on 06/23/2021 TDAP - never done Pneumovax - overdue Eye exam - overdue Covid vaccine - overdue  Star Rating Drugs: Atorvastatin 10 mg - no fill history Lisinopril 10 mg - no fill history Metformin 500 mg - no fill history Per patient he gets most of his meds filled at the [facility] Pharmacist Assistant [phone]

## 2021-08-30 NOTE — Progress Notes (Signed)
Chronic Care Management Pharmacy Note  09/02/2021 Name:  Todd Watson MRN:  [id number] DOB:  07-10-1941  Summary: BP is at goal < 140/90  Recommendations/Changes made from today's visit: -Recommended routine BP monitoring at home -Recommended stopping fish oil and aspirin due to risk of bleeding and no clinical ASCVD  Plan: Follow up BP assessment in 3 months  Subjective: Todd Watson is an 80 y.o. year old male who is a primary patient of [name], NP.  The CCM team was consulted for assistance with disease management and care coordination needs.    Engaged with patient face to face for initial visit in response to provider referral for pharmacy case management and/or care coordination services.   Consent to Services:  The patient was given the following information about Chronic Care Management services today, agreed to services, and gave verbal consent: 1. CCM service includes personalized support from designated clinical staff supervised by the primary care provider, including individualized plan of care and coordination with other care providers 2. 24/7 contact phone numbers for assistance for urgent and routine care needs. 3. Service will only be billed when office clinical staff spend 20 minutes or more in a month to coordinate care. 4. Only one practitioner may furnish and bill the service in a calendar month. 5.The patient may stop CCM services at any time (effective at the end of the month) by phone call to the office staff. 6. The patient will be responsible for cost sharing (co-pay) of up to 20% of the service fee (after annual deductible is met). Patient agreed to services and consent obtained.  Patient Care Team: [name], NP as PCP - General (Family Medicine) Kary Kos, MD as Consulting Physician (Neurosurgery) Viona Gilmore, [facility] as Pharmacist (Pharmacist)  Recent office visits: 06/23/2021 [name] NP - Patient was seen for Type 2 diabetes  mellitus and an additional issue. No medication changes. Administered influenza vaccine.   06/02/2021 Randel Pigg LPN - Medicare annual wellness exam.  Recent consult visits: 04/20/21 Lenda Kelp (dermatology): Unable to access notes.  03/12/21 [name] (urology): Unable to access notes.  Hospital visits: None in previous 6 months   Objective:  Lab Results  Component Value Date   CREATININE 0.90 12/11/2020   BUN 18 12/11/2020   GFR 79.78 12/11/2020   GFRNONAA >60 01/28/2020   GFRAA >60 01/28/2020   NA 140 12/11/2020   K 4.8 12/11/2020   CALCIUM 9.4 12/11/2020   CO2 30 12/11/2020   GLUCOSE 124 (H) 12/11/2020    Lab Results  Component Value Date/Time   HGBA1C 6.3 (A) 06/23/2021 09:10 AM   HGBA1C 6.9 (H) 12/11/2020 10:32 AM   HGBA1C 6.9 (H) 01/28/2020 12:23 PM   GFR 79.78 12/11/2020 10:32 AM   GFR 92.21 09/28/2018 11:03 AM   MICROALBUR 2.2 (H) 04/12/2017 10:28 AM    Last diabetic Eye exam: No results found for: HMDIABEYEEXA  Last diabetic Foot exam: No results found for: HMDIABFOOTEX   Lab Results  Component Value Date   CHOL 144 12/11/2020   HDL 44.70 12/11/2020   LDLCALC 84 12/11/2020   TRIG 76.0 12/11/2020   CHOLHDL 3 12/11/2020    Hepatic Function Latest Ref Rng & Units 12/11/2020 09/29/2019 09/28/2018  Total Protein 6.0 - 8.3 g/dL 6.9 7.1 6.9  Albumin 3.5 - 5.2 g/dL 4.3 4.0 4.6  AST 0 - 37 U/L 27 26 27   ALT 0 - 53 U/L 28 31 28   Alk Phosphatase 39 - 117  U/L 64 49 50  Total Bilirubin 0.2 - 1.2 mg/dL 0.9 0.7 0.7  Bilirubin, Direct 0.0 - 0.3 mg/dL - - -    Lab Results  Component Value Date/Time   TSH 2.17 12/11/2020 10:32 AM   TSH 2.30 09/28/2018 11:03 AM    CBC Latest Ref Rng & Units 12/11/2020 01/28/2020 09/29/2019  WBC 4.0 - 10.5 K/uL 5.1 8.9 4.7  Hemoglobin 13.0 - 17.0 g/dL 14.0 16.2 14.4  Hematocrit 39.0 - 52.0 % 40.8 47.7 43.4  Platelets 150.0 - 400.0 K/uL 185.0 193 162    No results found for: VD25OH  Clinical ASCVD: No  The ASCVD Risk  score (Arnett DK, et al., 2019) failed to calculate for the following reasons:   The 2019 ASCVD risk score is only valid for ages 53 to 72    Depression screen PHQ 2/9 06/02/2021 09/04/2018 08/09/2017  Decreased Interest 0 0 0  Down, Depressed, Hopeless 0 0 0  PHQ - 2 Score 0 0 0      Social History   Tobacco Use  Smoking Status Never  Smokeless Tobacco Never   BP Readings from Last 3 Encounters:  06/23/21 100/78  12/11/20 138/86  12/04/20 130/60   Pulse Readings from Last 3 Encounters:  06/23/21 (!) 56  12/11/20 70  12/04/20 60   Wt Readings from Last 3 Encounters:  06/23/21 159 lb (72.1 kg)  12/11/20 158 lb (71.7 kg)  12/04/20 148 lb 9.6 oz (67.4 kg)   BMI Readings from Last 3 Encounters:  06/23/21 24.90 kg/m  12/11/20 24.75 kg/m  12/04/20 23.27 kg/m    Assessment/Interventions: Review of patient past medical history, allergies, medications, health status, including review of consultants reports, laboratory and other test data, was performed as part of comprehensive evaluation and provision of chronic care management services.   SDOH:  (Social Determinants of Health) assessments and interventions performed: Yes SDOH Interventions    Flowsheet Row Most Recent Value  SDOH Interventions   Financial Strain Interventions Intervention Not Indicated  Transportation Interventions Intervention Not Indicated      SDOH Screenings   Alcohol Screen: Not on file  Depression (PHQ2-9): Low Risk    PHQ-2 Score: 0  Financial Resource Strain: Low Risk    Difficulty of Paying Living Expenses: Not very hard  Food Insecurity: No Food Insecurity   Worried About Charity fundraiser in the Last Year: Never true   Ran Out of Food in the Last Year: Never true  Housing: Low Risk    Last Housing Risk Score: 0  Physical Activity: Sufficiently Active   Days of Exercise per Week: 5 days   Minutes of Exercise per Session: 30 min  Social Connections: Socially Integrated   Frequency  of Communication with Friends and Family: Three times a week   Frequency of Social Gatherings with Friends and Family: Three times a week   Attends Religious Services: 1 to 4 times per year   Active Member of Clubs or Organizations: Yes   Attends Music therapist: More than 4 times per year   Marital Status: Married  Stress: No Stress Concern Present   Feeling of Stress : Not at all  Tobacco Use: Low Risk    Smoking Tobacco Use: Never   Smokeless Tobacco Use: Never   Passive Exposure: Not on file  Transportation Needs: No Transportation Needs   Lack of Transportation (Medical): No   Lack of Transportation (Non-Medical): No   Yes, he works like a Administrator, cutting  and stacking wood,  working around his house constantly.  No He tries to not eat sugars or foods with sugars.  Patient likes to work all the time and says busy. He gets up at 6am every morning, takes his thyroid medication, waits 30 mins and then takes his acid reflux medication. He spend a lot of the day cutting wood with help from otherwise and working around the house. 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  He lives with his wife who helps manage his pill box. Patient and his wife usually go to [place] in Jan and February very year and he enjoys fishing while he's there. They also go to [facility] in the Summer for 3 months.   Patient has a daughter in [place] and a son in [place]. His daughter comes and cleans their house. He has another son that he doesn't see as often. He also has a really helpful neighbor whom he gave up his dog to and he brings the dog over so they can visit sometimes.   Patient sleeps pretty well overall and only sometimes gets up at 4am and then can't get back to sleep. He usually gets up at 2am and 4am to go to the bathroom  He also takes a nap sometimes and feels pretty rested in the morning.    Patient  reports the only issue with his medications right now is that he doesn't feel that his sildenafil is helping and inquired about alternative medications.   CCM Care Plan  No Known Allergies  Medications Reviewed Today     Reviewed by Viona Gilmore, [facility] (Pharmacist) on 08/31/21 at 1000  Med List Status: <None>   Medication Order Taking? Sig Documenting Provider Last Dose Status Informant  aspirin 81 MG tablet [id number] Yes Take 81 mg by mouth daily. [provider] Taking Active Spouse/Significant Other  atorvastatin (LIPITOR) 10 MG tablet [id number] Yes Take 10 mg by mouth at bedtime. [provider] Taking Active Spouse/Significant Other  Boswellia-Glucosamine-Vit D (OSTEO BI-FLEX ONE PER DAY PO) [id number] Yes Take 1 tablet by mouth daily. [provider] Taking Active Spouse/Significant Other  diclofenac Sodium (VOLTAREN) 1 % GEL [id number] Yes Apply 1 application topically 4 (four) times daily as needed (pain). [provider] Taking Active Spouse/Significant Other  levothyroxine (SYNTHROID) 88 MCG tablet [id number] Yes Take 88 mcg by mouth daily before breakfast. [provider] Taking Active Spouse/Significant Other  lisinopril (PRINIVIL,ZESTRIL) 10 MG tablet [id number] Yes Take 5 mg by mouth in the morning and at bedtime.  [provider] Taking Active Spouse/Significant Other  Magnesium 250 MG TABS [id number] Yes Take by mouth daily. [provider] Taking Active   metFORMIN (GLUCOPHAGE) 500 MG tablet [id number] Yes Take by mouth daily with breakfast. [provider] Taking Active   Multiple Vitamin (MULTIVITAMIN) tablet [id number]  Take 1 tablet by mouth daily. [provider]  Active Spouse/Significant Other  naproxen sodium (ANAPROX) 220 MG tablet [id number] Yes Take 220 mg by mouth daily as needed (pain).  [provider] Taking Active Spouse/Significant Other  Omega-3 1000 MG CAPS [id number] Yes Take  1,000 mg by mouth daily. [provider] Taking Active Spouse/Significant Other  omeprazole (PRILOSEC) 20 MG capsule [id number] Yes Take 20 mg by mouth in the morning and at bedtime. [provider] Taking Active Spouse/Significant Other  Probiotic Product (PROBIOTIC DAILY PO) [id number] Yes Take 1  capsule by mouth daily.  [provider] Taking Active Spouse/Significant Other  psyllium (METAMUCIL) 58.6 % powder [id number] Yes Take 1 packet by mouth daily as needed (constipation).  [provider] Taking Active Spouse/Significant Other  sildenafil (VIAGRA) 100 MG tablet [id number] Yes Take 100 mg by mouth as needed for erectile dysfunction.  [provider] Taking Active Spouse/Significant Other  tamsulosin (FLOMAX) 0.4 MG CAPS capsule [id number] Yes Take 0.4 mg by mouth at bedtime. [provider] Taking Active Spouse/Significant Other  vitamin C (ASCORBIC ACID) 500 MG tablet [id number] Yes Take 500 mg by mouth daily. [provider] Taking Active Spouse/Significant Other            Patient Active Problem List   Diagnosis Date Noted   Mixed hyperlipidemia 11/06/2020   Essential hypertension, benign 07/04/2014   Hypothyroidism 07/04/2014   GERD (gastroesophageal reflux disease) 07/04/2014   Lumbar spondylosis 07/04/2014   Diabetes ([place]) 07/04/2014   Cholelithiases 07/04/2014   Herpes zoster 07/04/2014   History of renal calculi 07/04/2014   Diverticulitis of colon 07/04/2014   Hiatal hernia 07/04/2014    Immunization History  Administered Date(s) Administered   Fluad Quad(high Dose 65+) 06/27/2020, 06/23/2021   Influenza, High Dose Seasonal PF 07/31/2015, 07/04/2017   Influenza,inj,Quad PF,6+ Mos 07/04/2014, 08/05/2019   Influenza-Unspecified 08/21/2018   Moderna Sars-Covid-2 Vaccination 02/04/2020, 03/03/2020, 11/06/2020   PNEUMOCOCCAL CONJUGATE-20 06/10/2021   Pneumococcal Conjugate-13 07/04/2014   Zoster Recombinat  (Shingrix) 04/03/2017, 07/04/2017    Conditions to be addressed/monitored:  Hypertension, Hyperlipidemia, Diabetes, GERD, Hypothyroidism, and BPH  Care Plan : [place]  Updates made by Viona Gilmore, [place] since 09/02/2021 12:00 AM     Problem: Problem: Hypertension, Hyperlipidemia, Diabetes, GERD, Hypothyroidism, and BPH      Long-Range Goal: Patient-Specific Goal   Start Date: 08/31/2021  Expected End Date: 08/31/2022  This Visit's Progress: On track  Priority: High  Note:   Current Barriers:  Unable to independently monitor therapeutic efficacy  Pharmacist Clinical Goal(s):  Patient will achieve adherence to monitoring guidelines and medication adherence to achieve therapeutic efficacy through collaboration with PharmD and provider.   Interventions: 1:1 collaboration with [name], NP regarding development and update of comprehensive plan of care as evidenced by provider attestation and co-signature Inter-disciplinary care team collaboration (see longitudinal plan of care) Comprehensive medication review performed; medication list updated in electronic medical record  Hypertension (BP goal <140/90) -Controlled -Current treatment: Lisinopril 10 mg 1/2 tablet twice daily -Medications previously tried: unknown  -Current home readings: has an arm cuff that he pumps up himself (does not use often) -Current dietary habits: doesn't add much salt to food but eats out at [employer] for breakfast often; wife doesn't like to cook much anymore -Current exercise habits: active with chopping wood -Denies hypotensive/hypertensive symptoms -Educated on BP goals and benefits of medications for prevention of heart attack, stroke and kidney damage; Daily salt intake goal < 2300 mg; Importance of home blood pressure monitoring; Proper BP monitoring technique; -Counseled to monitor BP at home weekly, document, and provide log at future appointments -Counseled on diet  and exercise extensively Recommended to continue current medication  Hyperlipidemia: (LDL goal < 100) -Controlled -Current treatment: Atorvastatin 10 mg 1 tablet at bedtime Omega 3 fish oil 1000 mg 1 capsule daily -Medications previously tried: none  -Current dietary patterns: eats out more often than he should -Current exercise habits: active around the house -Educated on Cholesterol goals;  Benefits of statin for ASCVD risk reduction; -Counseled  on diet and exercise extensively Recommended to continue current medication Recommended stopping fish oil given risk vs benefit  Diabetes (A1c goal <7%) -Controlled -Current medications: Metformin 500 mg daily with breakfast -Medications previously tried: none  -Current home glucose readings fasting glucose: hard to check blood sugars post prandial glucose: n/a -Denies hypoglycemic/hyperglycemic symptoms -Current meal patterns:  breakfast: [employer] sausage, egg & biscuit  lunch: n/a  dinner: n/a snacks: n/a drinks: coffee in AM -Current exercise: active around the house -Educated on A1c and blood sugar goals; Benefits of routine self-monitoring of blood sugar; Continuous glucose monitoring; Carbohydrate counting and/or plate method -Counseled to check feet daily and get yearly eye exams -Counseled on diet and exercise extensively Recommended to continue current medication Recommended for patient to inquire about CGM with the [place].  Hypothyroidism (Goal: TSH 0.35-4.5) -Controlled -Current treatment  Levothyroxine 88 mcg 1 tablet daily before breakfast -Medications previously tried: none  -Recommended to continue current medication  Osteoarthritis (Goal: minimize pain) -Controlled -Current treatment  Osteo Biflex daily Diclofenac gel 1% as needed Naproxen 220 mg daily as needed -Medications previously tried: none  -Recommended limiting use of NSAIDs.  BPH (Goal: minimize symptoms) -Controlled -Current treatment   Tamsulosin 0.4 mg 1 capsule daily -Medications previously tried: none  -Recommended to continue current medication  GERD/hiatal hernia (Goal: minimize symptoms) -Controlled -Current treatment  Omeprazole 20 mg 1 capsule twice daily - only taking in the morning -Medications previously tried: none  -Recommended to continue current medication   Health Maintenance -Vaccine gaps: tetanus, pneumonia, COVID booster -Current therapy:  Aspirin 81 mg 1 tablet daily Multivitamin 1 tablet daily Probiotic 1 tablet daily Metamucil powder as needed - every 3 days Sildenafil 100 mg as needed Vitamin C 500 mg 1 tablet daily Magnesium 250 mg 1 tablet daily -Educated on Cost vs benefit of each product must be carefully weighed by individual consumer -Patient is satisfied with current therapy and denies issues -Recommended stopping aspirin based on lack of clinical ASCVD.  Patient Goals/Self-Care Activities Patient will:  - take medications as prescribed as evidenced by patient report and record review check blood pressure weekly, document, and provide at future appointments  Follow Up Plan: The care management team will reach out to the patient again over the next 30 days.        Medication Assistance: None required.  Patient affirms current coverage meets needs.  Compliance/Adherence/Medication fill history: Care Gaps: Eye exam, COVID booster, tetanus   [facility]: Unable to access records from the [place]  Patient's preferred pharmacy is:  [facility] [address], [place] [address] [place] [place] [address] Phone: [phone] Fax: [facility] [address], [place] [address] [place] [address] Phone: [phone] Fax: [phone]  Uses pill box? Yes Pt endorses 90% compliance  We discussed: Benefits of medication synchronization, packaging and delivery as well as enhanced  pharmacist oversight with [facility]. Patient decided to: Continue current medication management strategy  Care Plan and Follow Up Patient Decision:  Patient agrees to Care Plan and Follow-up.  Plan: Telephone follow up appointment with care management team member scheduled for:  6 months  [name], PharmD, [place] [facility] [place] [phone]

## 2021-08-31 ENCOUNTER — Ambulatory Visit: Payer: Medicare HMO | Admitting: Pharmacist

## 2021-08-31 DIAGNOSIS — I10 Essential (primary) hypertension: Secondary | ICD-10-CM

## 2021-08-31 DIAGNOSIS — E1169 Type 2 diabetes mellitus with other specified complication: Secondary | ICD-10-CM

## 2021-09-02 NOTE — Patient Instructions (Signed)
Hi Todd Watson,   It was great to get to meet you in person! Below is a summary of some of the topics we discussed. Don't forget to start checking your blood pressure at home. Also, go ahead and stop taking the fish oil and aspirin.  Please reach out to me if you have any questions or need anything before our follow up!  Best, Todd Watson  Todd Watson, Todd Watson, Todd Watson   Visit Information   Goals Addressed             This Visit's Progress    Track and Manage My Blood Pressure-Hypertension       Timeframe:  Long-Range Goal Priority:  Medium Start Date:                             Expected End Date:                       Follow Up Date 2/30/23    - check blood pressure weekly - choose a place to take my blood pressure (home, clinic or office, retail store) - write blood pressure results in a log or diary    Why is this important?   You won't feel high blood pressure, but it can still hurt your blood vessels.  High blood pressure can cause heart or kidney problems. It can also cause a stroke.  Making lifestyle changes like losing a little weight or eating less salt will help.  Checking your blood pressure at home and at different times of the day can help to control blood pressure.  If the doctor prescribes medicine remember to take it the way the doctor ordered.  Call the office if you cannot afford the medicine or if there are questions about it.     Notes:        Patient Care Plan: CCM Pharmacy Care Plan     Problem Identified: Problem: Hypertension, Hyperlipidemia, Diabetes, GERD, Hypothyroidism, and BPH      Long-Range Goal: Patient-Specific Goal   Start Date: 08/31/2021  Expected End Date: 08/31/2022  This Visit's Progress: On track  Priority: High  Note:   Current Barriers:  Unable to independently monitor therapeutic efficacy  Pharmacist Clinical Goal(s):  Patient will achieve adherence to  monitoring guidelines and medication adherence to achieve therapeutic efficacy through collaboration with Todd Watson and provider.   Interventions: 1:1 collaboration with Todd Peng, NP regarding development and update of comprehensive plan of care as evidenced by provider attestation and co-signature Inter-disciplinary care team collaboration (see longitudinal plan of care) Comprehensive medication review performed; medication list updated in electronic medical record  Hypertension (BP goal <140/90) -Controlled -Current treatment: Lisinopril 10 mg 1/2 tablet twice daily -Medications previously tried: unknown  -Current home readings: has an arm cuff that he pumps up himself (does not use often) -Current dietary habits: doesn't add much salt to food but eats out at [facility] for breakfast often; wife doesn't like to cook much anymore -Current exercise habits: active with chopping wood -Denies hypotensive/hypertensive symptoms -Educated on BP goals and benefits of medications for prevention of heart attack, stroke and kidney damage; Daily salt intake goal < 2300 mg; Importance of home blood pressure monitoring; Proper BP monitoring technique; -Counseled to monitor BP at home weekly, document, and provide log at future appointments -Counseled on diet and exercise extensively Recommended to continue current medication  Hyperlipidemia: (LDL goal <  100) -Controlled -Current treatment: Atorvastatin 10 mg 1 tablet at bedtime Omega 3 fish oil 1000 mg 1 capsule daily -Medications previously tried: none  -Current dietary patterns: eats out more often than he should -Current exercise habits: active around the house -Educated on Cholesterol goals;  Benefits of statin for ASCVD risk reduction; -Counseled on diet and exercise extensively Recommended to continue current medication Recommended stopping fish oil given risk vs benefit  Diabetes (A1c goal <7%) -Controlled -Current  medications: Metformin 500 mg daily with breakfast -Medications previously tried: none  -Current home glucose readings fasting glucose: hard to check blood sugars post prandial glucose: n/a -Denies hypoglycemic/hyperglycemic symptoms -Current meal patterns:  breakfast: [employer] sausage, egg & biscuit  lunch: n/a  dinner: n/a snacks: n/a drinks: coffee in AM -Current exercise: active around the house -Educated on A1c and blood sugar goals; Benefits of routine self-monitoring of blood sugar; Continuous glucose monitoring; Carbohydrate counting and/or plate method -Counseled to check feet daily and get yearly eye exams -Counseled on diet and exercise extensively Recommended to continue current medication Recommended for patient to inquire about CGM with the [place].  Hypothyroidism (Goal: TSH 0.35-4.5) -Controlled -Current treatment  Levothyroxine 88 mcg 1 tablet daily before breakfast -Medications previously tried: none  -Recommended to continue current medication  Osteoarthritis (Goal: minimize pain) -Controlled -Current treatment  Osteo Biflex daily Diclofenac gel 1% as needed Naproxen 220 mg daily as needed -Medications previously tried: none  -Recommended limiting use of NSAIDs.  BPH (Goal: minimize symptoms) -Controlled -Current treatment  Tamsulosin 0.4 mg 1 capsule daily -Medications previously tried: none  -Recommended to continue current medication  GERD/hiatal hernia (Goal: minimize symptoms) -Controlled -Current treatment  Omeprazole 20 mg 1 capsule twice daily - only taking in the morning -Medications previously tried: none  -Recommended to continue current medication   Health Maintenance -Vaccine gaps: tetanus, pneumonia, COVID booster -Current therapy:  Aspirin 81 mg 1 tablet daily Multivitamin 1 tablet daily Probiotic 1 tablet daily Metamucil powder as needed - every 3 days Sildenafil 100 mg as needed Vitamin C 500 mg 1 tablet daily Magnesium  250 mg 1 tablet daily -Educated on Cost vs benefit of each product must be carefully weighed by individual consumer -Patient is satisfied with current therapy and denies issues -Recommended stopping aspirin based on lack of clinical ASCVD.  Patient Goals/Self-Care Activities Patient will:  - take medications as prescribed as evidenced by patient report and record review check blood pressure weekly, document, and provide at future appointments  Follow Up Plan: The care management team will reach out to the patient again over the next 30 days.       Todd Watson was given information about Chronic Care Management services today including:  CCM service includes personalized support from designated clinical staff supervised by his physician, including individualized plan of care and coordination with other care providers 24/7 contact phone numbers for assistance for urgent and routine care needs. Standard insurance, coinsurance, copays and deductibles apply for chronic care management only during months in which we provide at least 20 minutes of these services. Most insurances cover these services at 100%, however patients may be responsible for any copay, coinsurance and/or deductible if applicable. This service may help you avoid the need for more expensive face-to-face services. Only one practitioner may furnish and bill the service in a calendar month. The patient may stop CCM services at any time (effective at the end of the month) by phone call to the office staff.  Patient agreed to  services and verbal consent obtained.   The patient verbalized understanding of instructions, educational materials, and care plan provided today and agreed to receive a mailed copy of patient instructions, educational materials, and care plan.  Telephone follow up appointment with pharmacy team member scheduled for: 6 months  Todd Watson, [facility]

## 2021-09-11 ENCOUNTER — Ambulatory Visit: Payer: Medicare HMO | Admitting: Orthopedic Surgery

## 2021-09-18 DIAGNOSIS — R3912 Poor urinary stream: Secondary | ICD-10-CM | POA: Diagnosis not present

## 2021-09-18 DIAGNOSIS — N5201 Erectile dysfunction due to arterial insufficiency: Secondary | ICD-10-CM | POA: Diagnosis not present

## 2021-09-18 DIAGNOSIS — N401 Enlarged prostate with lower urinary tract symptoms: Secondary | ICD-10-CM | POA: Diagnosis not present

## 2021-09-29 ENCOUNTER — Encounter: Payer: Self-pay | Admitting: Adult Health

## 2021-10-01 ENCOUNTER — Other Ambulatory Visit: Payer: Self-pay

## 2021-10-01 ENCOUNTER — Emergency Department (HOSPITAL_BASED_OUTPATIENT_CLINIC_OR_DEPARTMENT_OTHER): Payer: Medicare HMO | Admitting: Radiology

## 2021-10-01 ENCOUNTER — Emergency Department (HOSPITAL_BASED_OUTPATIENT_CLINIC_OR_DEPARTMENT_OTHER)
Admission: EM | Admit: 2021-10-01 | Discharge: 2021-10-02 | Disposition: A | Discharge status: Home / Self Care | Payer: Medicare HMO | Attending: Emergency Medicine | Admitting: Emergency Medicine

## 2021-10-01 DIAGNOSIS — Z20822 Contact with and (suspected) exposure to covid-19: Secondary | ICD-10-CM | POA: Insufficient documentation

## 2021-10-01 DIAGNOSIS — Z79899 Other long term (current) drug therapy: Secondary | ICD-10-CM | POA: Diagnosis not present

## 2021-10-01 DIAGNOSIS — J40 Bronchitis, not specified as acute or chronic: Secondary | ICD-10-CM

## 2021-10-01 DIAGNOSIS — J209 Acute bronchitis, unspecified: Secondary | ICD-10-CM | POA: Diagnosis not present

## 2021-10-01 DIAGNOSIS — M791 Myalgia, unspecified site: Secondary | ICD-10-CM | POA: Diagnosis not present

## 2021-10-01 DIAGNOSIS — M542 Cervicalgia: Secondary | ICD-10-CM | POA: Diagnosis not present

## 2021-10-01 DIAGNOSIS — I10 Essential (primary) hypertension: Secondary | ICD-10-CM | POA: Diagnosis not present

## 2021-10-01 DIAGNOSIS — E039 Hypothyroidism, unspecified: Secondary | ICD-10-CM | POA: Diagnosis not present

## 2021-10-01 DIAGNOSIS — Z7982 Long term (current) use of aspirin: Secondary | ICD-10-CM | POA: Diagnosis not present

## 2021-10-01 DIAGNOSIS — R059 Cough, unspecified: Secondary | ICD-10-CM | POA: Diagnosis present

## 2021-10-01 LAB — CBC WITH DIFFERENTIAL/PLATELET
Abs Immature Granulocytes: 0.02 10*3/uL (ref 0.00–0.07)
Basophils Absolute: 0.1 10*3/uL (ref 0.0–0.1)
Basophils Relative: 1 %
Eosinophils Absolute: 0.3 10*3/uL (ref 0.0–0.5)
Eosinophils Relative: 3 %
HCT: 40.9 % (ref 39.0–52.0)
Hemoglobin: 13.7 g/dL (ref 13.0–17.0)
Immature Granulocytes: 0 %
Lymphocytes Relative: 20 %
Lymphs Abs: 1.8 10*3/uL (ref 0.7–4.0)
MCH: 30.8 pg (ref 26.0–34.0)
MCHC: 33.5 g/dL (ref 30.0–36.0)
MCV: 91.9 fL (ref 80.0–100.0)
Monocytes Absolute: 0.9 10*3/uL (ref 0.1–1.0)
Monocytes Relative: 10 %
Neutro Abs: 5.8 10*3/uL (ref 1.7–7.7)
Neutrophils Relative %: 66 %
Platelets: 173 10*3/uL (ref 150–400)
RBC: 4.45 MIL/uL (ref 4.22–5.81)
RDW: 13.3 % (ref 11.5–15.5)
WBC: 8.8 10*3/uL (ref 4.0–10.5)
nRBC: 0 % (ref 0.0–0.2)

## 2021-10-01 LAB — RESP PANEL BY RT-PCR (FLU A&B, COVID) ARPGX2
Influenza A by PCR: NEGATIVE
Influenza B by PCR: NEGATIVE
SARS Coronavirus 2 by RT PCR: NEGATIVE

## 2021-10-01 NOTE — ED Triage Notes (Signed)
Pt via pov from home with cough x 2 days. Pt also c/o pain in his neck on the left side. Pt has taken benadryl and tylenol when his temperature was high earlier today. Pt alert & oriented, nad noted.

## 2021-10-01 NOTE — ED Provider Notes (Signed)
[facility] EMERGENCY DEPT Provider Note   CSN: [id number] Arrival date & time: 10/01/21  1924     History Chief Complaint  Patient presents with   Cough    Todd Watson is a 80 y.o. male.  Patient with a history of hypertension and osteoarthritis presenting with a 2-day history of cough.  He does use a wood stove at home.  States he is coughing up white and yellow stuff.  Denies fevers, chills, nausea or vomiting.  No chest pain or shortness of breath.  Denies any cardiac or pulmonary history.  Does not smoke. Does not think he had a fever at home but says he is felt warm. Also having left-sided neck pain intermittent for the past 2 days without trauma.  Believes he may have slept wrong.  There is no radiation of the pain down his arm.  No weakness or numbness.  No headache.  No difficulty speaking or difficulty swallowing.  No visual changes.  No chest pain or shortness of breath. No pain with arm movement on the left  The history is provided by the patient.  Cough Associated symptoms: myalgias   Associated symptoms: no chest pain, no fever, no headaches, no rash and no rhinorrhea       Past Medical History:  Diagnosis Date   Arthritis    GERD (gastroesophageal reflux disease)    Herpes zoster    Hiatal hernia    Hypothyroidism    Osteoarthritis of lumbar spine     Patient Active Problem List   Diagnosis Date Noted   Mixed hyperlipidemia 11/06/2020   Essential hypertension, benign 07/04/2014   Hypothyroidism 07/04/2014   GERD (gastroesophageal reflux disease) 07/04/2014   Lumbar spondylosis 07/04/2014   Diabetes ([place]) 07/04/2014   Cholelithiases 07/04/2014   Herpes zoster 07/04/2014   History of renal calculi 07/04/2014   Diverticulitis of colon 07/04/2014   Hiatal hernia 07/04/2014    Past Surgical History:  Procedure Laterality Date   APPENDECTOMY  2000   INGUINAL HERNIA REPAIR Bilateral    LUMBAR LAMINECTOMY/DECOMPRESSION MICRODISCECTOMY  Right 01/28/2020   Procedure: Microdiscectomy - right - Lumbar four-Lumbar five;  Surgeon: Kary Kos, MD;  Location: [facility];  Service: Neurosurgery;  Laterality: Right;   SPINE SURGERY     spurs       Family History  Problem Relation Age of Onset   Heart disease Father        Valve disease   Kidney failure Mother     Social History   Tobacco Use   Smoking status: Never   Smokeless tobacco: Never  Substance Use Topics   Alcohol use: No   Drug use: No    Home Medications Prior to Admission medications   Medication Sig Start Date End Date Taking? Authorizing Provider  aspirin 81 MG tablet Take 81 mg by mouth daily.    [provider]  atorvastatin (LIPITOR) 10 MG tablet Take 10 mg by mouth at bedtime.    [provider]  Boswellia-Glucosamine-Vit D (OSTEO BI-FLEX ONE PER DAY PO) Take 1 tablet by mouth daily.    [provider]  diclofenac Sodium (VOLTAREN) 1 % GEL Apply 1 application topically 4 (four) times daily as needed (pain).    [provider]  levothyroxine (SYNTHROID) 88 MCG tablet Take 88 mcg by mouth daily before breakfast.    [provider]  lisinopril (PRINIVIL,ZESTRIL) 10 MG tablet Take 5 mg by mouth in the morning and at bedtime.  [provider]  Magnesium 250 MG TABS Take by mouth daily.    [provider]  metFORMIN (GLUCOPHAGE) 500 MG tablet Take by mouth daily with breakfast.    [provider]  Multiple Vitamin (MULTIVITAMIN) tablet Take 1 tablet by mouth daily.    [provider]  naproxen sodium (ANAPROX) 220 MG tablet Take 220 mg by mouth daily as needed (pain).     [provider]  Omega-3 1000 MG CAPS Take 1,000 mg by mouth daily.    [provider]  omeprazole (PRILOSEC) 20 MG capsule Take 20 mg by mouth in the morning and at bedtime.    [provider]  Probiotic Product (PROBIOTIC DAILY PO) Take 1 capsule by mouth daily.     [provider]  psyllium (METAMUCIL) 58.6 % powder Take 1 packet by mouth daily as needed (constipation).     [provider]  sildenafil (VIAGRA) 100 MG tablet Take 100 mg by mouth as needed for erectile dysfunction.     [provider]  tamsulosin (FLOMAX) 0.4 MG CAPS capsule Take 0.4 mg by mouth at bedtime.    [provider]  vitamin C (ASCORBIC ACID) 500 MG tablet Take 500 mg by mouth daily.    [provider]    Allergies    Patient has no known allergies.  Review of Systems   Review of Systems  Constitutional:  Negative for activity change, appetite change and fever.  HENT:  Negative for congestion and rhinorrhea.   Respiratory:  Positive for cough. Negative for chest tightness.   Cardiovascular:  Negative for chest pain.  Gastrointestinal:  Negative for abdominal pain, nausea and vomiting.  Genitourinary:  Negative for dysuria, hematuria and testicular pain.  Musculoskeletal:  Positive for arthralgias, myalgias and neck pain. Negative for back pain.  Skin:  Negative for rash.  Neurological:  Negative for dizziness, weakness and headaches.   all other systems are negative except as noted in the HPI and PMH.   Physical Exam Updated Vital Signs BP (!) 162/84    Pulse (!) 58    Temp 98.1 F (36.7 C) (Oral)    Resp 16    Ht 5\' 7"  (1.702 m)    Wt 70.8 kg    SpO2 97%    BMI 24.43 kg/m   Physical Exam Vitals and nursing note reviewed.  Constitutional:      General: He is not in acute distress.    Appearance: He is well-developed.  HENT:     Head: Normocephalic and atraumatic.     Mouth/Throat:     Pharynx: No oropharyngeal exudate.  Eyes:     Conjunctiva/sclera: Conjunctivae normal.     Pupils: Pupils are equal, round, and reactive to light.  Neck:     Comments: L paraspinal cervical tenderness Cardiovascular:     Rate and Rhythm: Normal rate and regular rhythm.     Heart sounds: Normal heart sounds. No murmur heard. Pulmonary:      Effort: Pulmonary effort is normal. No respiratory distress.     Breath sounds: Normal breath sounds.  Chest:     Chest wall: No tenderness.  Abdominal:     Palpations: Abdomen is soft.     Tenderness: There is no abdominal tenderness. There is no guarding or rebound.  Musculoskeletal:        General: No tenderness. Normal range of motion.     Cervical back: Normal range of motion and neck supple.  Skin:  General: Skin is warm.  Neurological:     Mental Status: He is alert and oriented to person, place, and time.     Cranial Nerves: No cranial nerve deficit.     Motor: No abnormal muscle tone.     Coordination: Coordination normal.     Comments:  5/5 strength throughout. CN 2-12 intact.Equal grip strength.   Psychiatric:        Behavior: Behavior normal.    ED Results / Procedures / Treatments   Labs (all labs ordered are listed, but only abnormal results are displayed) Labs Reviewed  BASIC METABOLIC PANEL - Abnormal; Notable for the following components:      Result Value   Glucose, Bld 125 (*)    All other components within normal limits  RESP PANEL BY RT-PCR (FLU A&B, COVID) [id number]  CBC WITH DIFFERENTIAL/PLATELET  TROPONIN I (HIGH SENSITIVITY)  TROPONIN I (HIGH SENSITIVITY)    EKG EKG Interpretation  Date/Time:  Thursday October 01 2021 23:31:57 EST Ventricular Rate:  58 PR Interval:  165 QRS Duration: 99 QT Interval:  400 QTC Calculation: 393 R Axis:   -9 Text Interpretation: Sinus rhythm Ventricular premature complex No significant change was found Confirmed by Ezequiel Essex [phone]) on 10/01/2021 11:49:06 PM  Radiology DG Chest 2 View  Result Date: 10/02/2021 CLINICAL DATA:  Cough. EXAM: CHEST - 2 VIEW COMPARISON:  Chest radiograph dated 05/20/2009. FINDINGS: No focal consolidation, pleural effusion or pneumothorax. Right nipple shadow. The cardiac silhouette is within limits. No acute osseous pathology. IMPRESSION: No active cardiopulmonary disease.  Electronically Signed   By: Anner Crete M.D.   On: 10/02/2021 00:13   CT Angio Neck W and/or Wo Contrast  Result Date: 10/02/2021 CLINICAL DATA:  Cough with left-sided neck pain EXAM: CT ANGIOGRAPHY NECK TECHNIQUE: Multidetector CT imaging of the neck was performed using the standard protocol during bolus administration of intravenous contrast. Multiplanar CT image reconstructions and MIPs were obtained to evaluate the vascular anatomy. Carotid stenosis measurements (when applicable) are obtained utilizing NASCET criteria, using the distal internal carotid diameter as the denominator. CONTRAST:  81mL OMNIPAQUE IOHEXOL 350 MG/ML SOLN COMPARISON:  None. FINDINGS: Aortic arch: Calcific aortic atherosclerosis. Right carotid system: Mild calcific atherosclerosis at the right carotid bifurcation. No hemodynamically significant stenosis. Left carotid system: No evidence of dissection, stenosis (50% or greater) or occlusion. Vertebral arteries: Codominant.  Normal. Visualized intracranial circulation: No occlusion of the visualized intracranial arteries. Skeleton: Negative Other neck: Unremarkable Upper chest: No acute abnormality IMPRESSION: 1. No dissection, occlusion or hemodynamically significant stenosis of the carotid or vertebral arteries. Aortic Atherosclerosis (ICD10-I70.0). Electronically Signed   By: Ulyses Jarred M.D.   On: 10/02/2021 01:57   CT Angio Aortobifemoral W and/or Wo Contrast  Result Date: 10/02/2021 CLINICAL DATA:  Lower extremity arterial embolism concern. Back pain extending into the bilateral lower legs. EXAM: CT ANGIOGRAPHY OF ABDOMINAL AORTA WITH ILIOFEMORAL RUNOFF TECHNIQUE: Multidetector CT imaging of the abdomen, pelvis and lower extremities was performed using the standard protocol during bolus administration of intravenous contrast. Multiplanar CT image reconstructions and MIPs were obtained to evaluate the vascular anatomy. CONTRAST:  165mL OMNIPAQUE IOHEXOL 350 MG/ML SOLN  COMPARISON:  None. FINDINGS: VASCULAR Aorta: Atheromatous calcification of the aorta with mild tortuosity. No aneurysm, dissection, or stenosis. Celiac: Scattered atheromatous calcifications.  No stenosis. SMA: Scattered atheromatous calcifications. Renals: Mild atheromatous plaque at the ostia. No stenosis or dissection IMA: Patent RIGHT Lower Extremity Inflow: Atheromatous plaque without stenosis or aneurysm Outflow: Mild atheromatous plaque to  the SFA. Widely patent SFA and profundus. Runoff: Robust proximal flow. Single-vessel runoff at the ankle which is the posterior tibial. No acute occlusion or discrete stenosis is seen. LEFT Lower Extremity Inflow: Atheromatous plaque without aneurysm, dissection, or major vessel stenosis. Outflow: Mild SFA plaque without stenosis.  Widely patent profundal. Runoff: Less intense opacification of the vessels when compared to the right, but symmetric in the arterial phase at the level of the popliteal arteries on mid femoral coverage, likely physiologic differences. More symmetric flow within arteries of the calf where there has already been some washout with venous return. There is still detectable atheromatous plaque seen intermittently. Other: Baker cyst on the left without complicating features. Veins: Negative without contrast opacification. Review of the MIP images confirms the above findings. NON-VASCULAR Lower chest: Coronary atherosclerosis. Small hiatal hernia which is sliding. Hepatobiliary: Large fissures and caudate lobe with borderline surface lobulation. No evidence of mass lesion.Full gallbladder with calculi. No pericholecystic inflammation or bile duct dilatation. Pancreas: Unremarkable. Spleen: Unremarkable. Adrenals/Urinary Tract: Negative adrenals. Contrast excretion and corticomedullary enhancement in the setting of prior CT contrast administration. No hydronephrosis. Possible trabeculation of the bladder but under distended. Stomach/Bowel:  No  obstruction. No visible bowel inflammation Lymphatic: No mass or adenopathy. Reproductive:No pathologic findings. Other: No ascites or pneumoperitoneum. Musculoskeletal: Generalized spinal degeneration with lumbar scoliosis. Multilevel lumbar foraminal and canal impingement. IMPRESSION: 1. No emergent vascular finding. 2. Atherosclerosis that is typical for age. Dominant atheromatous findings are below the knee. 3. Cholelithiasis without evidence of acute cholecystitis. 4. Advanced lumbar spine degeneration with scoliosis and neural impingement. 5. Liver morphology potentially reflecting cirrhosis, please correlate with risk factors. Electronically Signed   By: Jorje Guild M.D.   On: 10/02/2021 05:16    Procedures Procedures   Medications Ordered in ED Medications - No data to display  ED Course  I have reviewed the triage vital signs and the nursing notes.  Pertinent labs & imaging results that were available during my care of the patient were reviewed by me and considered in my medical decision making (see chart for details).    MDM Rules/Calculators/A&P                         2 days of cough without shortness of breath or chest pain.  No hypoxia or increased work of breathing.  Also atraumatic left-sided neck pain without trauma.  Neurovascularly intact  COVID and flu swabs are negative.  Lungs are clear to auscultation without wheezing.  Chest x-ray is negative for infiltrate. EKG shows no acute ischemia.  Low suspicion for atypical ACS with negative troponin and ongoing symptoms for 2 days  Chest x-ray negative for infiltrate.  CT angiogram neck shows no carotid or vertebral dissection.  No acute vascular pathology.  While having his blood pressure taken, patient began screaming of "sciatic nerve pain".  He describes pain to his bilateral buttock without radiation.  He has had previous back surgeries and had this pain in the past but does not know what he takes for it.  He denies  any fall or trauma today.  Pain came on acutely while he was sitting in the bed.  He has no weakness in his lower extremities.  Intact DP and PT pulses.  CT imaging was pursued to look for emergent vascular pathology including aortic dissection or acute limb ischemia.  This was negative and showed no acute findings.  Patient's pain has resolved with Ativan and Vicodin.  Patient able to ambulate.  States he has had severe pain like this in the past but is uncertain what caused his onset so acutely.  Troponin remains negative.  Low suspicion for ACS  We will treat for bronchitis with antibiotics and cough suppressants.  Low suspicion for ACS or pulmonary embolism.  Follow-up with PCP.  Return precautions discussed.    Final Clinical Impression(s) / ED Diagnoses Final diagnoses:  Bronchitis  Neck pain    Rx / DC Orders ED Discharge Orders     None        Ramsey Midgett, Annie Main, MD 10/02/21 [phone]

## 2021-10-02 ENCOUNTER — Emergency Department (HOSPITAL_BASED_OUTPATIENT_CLINIC_OR_DEPARTMENT_OTHER): Payer: Medicare HMO

## 2021-10-02 ENCOUNTER — Encounter (HOSPITAL_BASED_OUTPATIENT_CLINIC_OR_DEPARTMENT_OTHER): Payer: Self-pay

## 2021-10-02 ENCOUNTER — Ambulatory Visit (INDEPENDENT_AMBULATORY_CARE_PROVIDER_SITE_OTHER): Payer: Medicare HMO

## 2021-10-02 ENCOUNTER — Ambulatory Visit (INDEPENDENT_AMBULATORY_CARE_PROVIDER_SITE_OTHER): Payer: Medicare HMO | Admitting: Family Medicine

## 2021-10-02 ENCOUNTER — Encounter: Payer: Self-pay | Admitting: Family Medicine

## 2021-10-02 VITALS — BP 118/68 | HR 66 | Temp 97.9°F | Ht 67.0 in | Wt 160.3 lb

## 2021-10-02 DIAGNOSIS — K8020 Calculus of gallbladder without cholecystitis without obstruction: Secondary | ICD-10-CM | POA: Diagnosis not present

## 2021-10-02 DIAGNOSIS — M19012 Primary osteoarthritis, left shoulder: Secondary | ICD-10-CM | POA: Diagnosis not present

## 2021-10-02 DIAGNOSIS — R200 Anesthesia of skin: Secondary | ICD-10-CM | POA: Diagnosis not present

## 2021-10-02 DIAGNOSIS — M50322 Other cervical disc degeneration at C5-C6 level: Secondary | ICD-10-CM | POA: Diagnosis not present

## 2021-10-02 DIAGNOSIS — M25512 Pain in left shoulder: Secondary | ICD-10-CM

## 2021-10-02 DIAGNOSIS — M542 Cervicalgia: Secondary | ICD-10-CM

## 2021-10-02 DIAGNOSIS — M2578 Osteophyte, vertebrae: Secondary | ICD-10-CM | POA: Diagnosis not present

## 2021-10-02 DIAGNOSIS — K7460 Unspecified cirrhosis of liver: Secondary | ICD-10-CM | POA: Diagnosis not present

## 2021-10-02 DIAGNOSIS — I700 Atherosclerosis of aorta: Secondary | ICD-10-CM | POA: Diagnosis not present

## 2021-10-02 DIAGNOSIS — M50323 Other cervical disc degeneration at C6-C7 level: Secondary | ICD-10-CM | POA: Diagnosis not present

## 2021-10-02 DIAGNOSIS — R059 Cough, unspecified: Secondary | ICD-10-CM | POA: Diagnosis not present

## 2021-10-02 LAB — BASIC METABOLIC PANEL
Anion gap: 9 (ref 5–15)
BUN: 20 mg/dL (ref 8–23)
CO2: 27 mmol/L (ref 22–32)
Calcium: 9.2 mg/dL (ref 8.9–10.3)
Chloride: 103 mmol/L (ref 98–111)
Creatinine, Ser: 0.87 mg/dL (ref 0.61–1.24)
GFR, Estimated: >60 mL/min (ref >60)
Glucose, Bld: 125 mg/dL — ABNORMAL HIGH (ref 70–99)
Potassium: 4.2 mmol/L (ref 3.5–5.1)
Sodium: 139 mmol/L (ref 135–145)

## 2021-10-02 LAB — TROPONIN I (HIGH SENSITIVITY)
Troponin I (High Sensitivity): 5 ng/L (ref <18)
Troponin I (High Sensitivity): 7 ng/L (ref <18)

## 2021-10-02 MED ORDER — HYDROCODONE-ACETAMINOPHEN 5-325 MG PO TABS
1.0000 | ORAL_TABLET | Freq: Once | ORAL | Status: AC
  Administered 2021-10-02: 03:00:00 1 via ORAL
  Filled 2021-10-02: qty 1

## 2021-10-02 MED ORDER — KETOROLAC TROMETHAMINE 30 MG/ML IJ SOLN
15.0000 mg | Freq: Once | INTRAMUSCULAR | Status: AC
  Administered 2021-10-02: 03:00:00 15 mg via INTRAVENOUS
  Filled 2021-10-02: qty 1

## 2021-10-02 MED ORDER — BENZONATATE 100 MG PO CAPS: 100.0000 mg | ORAL_CAPSULE | Freq: Three times a day (TID) | ORAL | 0 refills | Status: DC

## 2021-10-02 MED ORDER — IOHEXOL 350 MG/ML SOLN
100.0000 mL | Freq: Once | INTRAVENOUS | Status: AC | PRN
  Administered 2021-10-02: 05:00:00 100 mL via INTRAVENOUS

## 2021-10-02 MED ORDER — DOXYCYCLINE HYCLATE 100 MG PO CAPS: 100.0000 mg | ORAL_CAPSULE | Freq: Two times a day (BID) | ORAL | 0 refills | Status: DC

## 2021-10-02 MED ORDER — LORAZEPAM 1 MG PO TABS
0.5000 mg | ORAL_TABLET | Freq: Once | ORAL | Status: AC
  Administered 2021-10-02: 03:00:00 0.5 mg via ORAL
  Filled 2021-10-02: qty 1

## 2021-10-02 MED ORDER — IOHEXOL 350 MG/ML SOLN
80.0000 mL | Freq: Once | INTRAVENOUS | Status: AC | PRN
  Administered 2021-10-02: 01:00:00 80 mL via INTRAVENOUS

## 2021-10-02 NOTE — Progress Notes (Signed)
Todd Watson DOB: 1941-05-07 Encounter date: 10/02/2021  This is a 80 y.o. male who presents with Chief Complaint  Patient presents with   Neck Pain    Patient complains of left-sided neck pain x1 week   Shoulder Pain    Patient complains of left shoulder pain x1 week, no known injury    History of present illness: Left shoulder has been bothering him for awhile. Hurts to lift above shoulder.left side neck hurting more in last few days. No known injury. Wonders if fell asleep wrong. Does feel like it is better. No headache. Stays active, cuts wood,etc. Tried aleve yesterday which did help.  Patient was in ER overnight.went in for cough and as they were setting up IV he got sudden severe leg pain. CT angio neck along with aortobifem completed and both non acute; AO/bifem report: IMPRESSION: 1. No emergent vascular finding. 2. Atherosclerosis that is typical for age. Dominant atheromatous findings are below the knee. 3. Cholelithiasis without evidence of acute cholecystitis. 4. Advanced lumbar spine degeneration with scoliosis and neural impingement. 5. Liver morphology potentially reflecting cirrhosis, please correlate with risk factors.  No Known Allergies Current Meds  Medication Sig   aspirin 81 MG tablet Take 81 mg by mouth daily.   atorvastatin (LIPITOR) 10 MG tablet Take 10 mg by mouth at bedtime.   benzonatate (TESSALON) 100 MG capsule Take 1 capsule (100 mg total) by mouth every 8 (eight) hours.   Boswellia-Glucosamine-Vit D (OSTEO BI-FLEX ONE PER DAY PO) Take 1 tablet by mouth daily.   diclofenac Sodium (VOLTAREN) 1 % GEL Apply 1 application topically 4 (four) times daily as needed (pain).   doxycycline (VIBRAMYCIN) 100 MG capsule Take 1 capsule (100 mg total) by mouth 2 (two) times daily.   levothyroxine (SYNTHROID) 88 MCG tablet Take 88 mcg by mouth daily before breakfast.   lisinopril (PRINIVIL,ZESTRIL) 10 MG tablet Take 5 mg by mouth in the morning and at bedtime.     Magnesium 250 MG TABS Take by mouth daily.   metFORMIN (GLUCOPHAGE) 500 MG tablet Take by mouth daily with breakfast.   Multiple Vitamin (MULTIVITAMIN) tablet Take 1 tablet by mouth daily.   naproxen sodium (ANAPROX) 220 MG tablet Take 220 mg by mouth daily as needed (pain).    Omega-3 1000 MG CAPS Take 1,000 mg by mouth daily.   omeprazole (PRILOSEC) 20 MG capsule Take 20 mg by mouth in the morning and at bedtime.   Probiotic Product (PROBIOTIC DAILY PO) Take 1 capsule by mouth daily.    psyllium (METAMUCIL) 58.6 % powder Take 1 packet by mouth daily as needed (constipation).    sildenafil (VIAGRA) 100 MG tablet Take 100 mg by mouth as needed for erectile dysfunction.    tamsulosin (FLOMAX) 0.4 MG CAPS capsule Take 0.4 mg by mouth at bedtime.   vitamin C (ASCORBIC ACID) 500 MG tablet Take 500 mg by mouth daily.    Review of Systems  Constitutional:  Negative for chills, fatigue and fever.  Respiratory:  Positive for cough. Negative for chest tightness, shortness of breath and wheezing.   Cardiovascular:  Negative for chest pain, palpitations and leg swelling.  Musculoskeletal:  Positive for arthralgias (left shoulder), neck pain and neck stiffness.  Neurological:  Positive for numbness (left finger tips).   Objective:  BP 118/68 (BP Location: Left Arm, Patient Position: Sitting, Cuff Size: Normal)    Pulse 66    Temp 97.9 F (36.6 C) (Oral)    Ht 5\' 7"  (1.702  m)    Wt 160 lb 4.8 oz (72.7 kg)    SpO2 98%    BMI 25.11 kg/m   Weight: 160 lb 4.8 oz (72.7 kg)   BP Readings from Last 3 Encounters:  10/02/21 118/68  10/02/21 (!) 149/83  06/23/21 100/78   Wt Readings from Last 3 Encounters:  10/02/21 160 lb 4.8 oz (72.7 kg)  10/01/21 156 lb (70.8 kg)  06/23/21 159 lb (72.1 kg)    Physical Exam Constitutional:      General: He is not in acute distress.    Appearance: He is well-developed.  Cardiovascular:     Rate and Rhythm: Normal rate and regular rhythm.     Heart sounds:  Normal heart sounds. No murmur heard.   No friction rub.  Pulmonary:     Effort: Pulmonary effort is normal. No respiratory distress.     Breath sounds: Normal breath sounds. No wheezing or rales.  Musculoskeletal:     Right lower leg: No edema.     Left lower leg: No edema.     Comments: He has symmetric right he does have some tenderness left paraspinal musculature up to the occiput.  Pressing on this area does reproduce muscular stiffness that he describes.  Negative Spurling's.  Left shoulder abduction is limited to 90 degrees.  Positive Hawkins.  No significant weakness noted of the shoulder.  Neurological:     Mental Status: He is alert and oriented to person, place, and time.     Comments: Normal strength of hands and arms.  Psychiatric:        Behavior: Behavior normal.    Assessment/Plan 1. Left shoulder pain, unspecified chronicity Start with x-ray.  Consider further evaluation pending this.  Patient plans to leave the state next week, so would benefit from follow-up plan.  Discussed continuing with gentle range of motion exercises for the shoulder.  Avoid heavy lifting. Suggested taking aleve (he uses this regularly) twice daily for next 1-2 weeks to help with inflammation.  - DG Shoulder Left; Future  2. Neck pain Due to some described neuropathy in fingertips (seems to spare thumb) we will also get neck x-ray and consider further evaluation pending these results. - DG Cervical Spine Complete; Future  Return for pending xray.       [name], MD

## 2021-10-02 NOTE — Patient Instructions (Addendum)
Do some easy range of motion:  Walking fingers across table or up the wall. Gentle range of motion exercises for neck  Take the aleve with food twice daily for the next 1-2 weeks. Let us know if not better at that time.

## 2021-10-02 NOTE — Discharge Instructions (Addendum)
COVID and flu test are negative.  No evidence of pneumonia.  Take the antibiotic for bronchitis as prescribed.  Follow-up with your doctor.  Return to the ED with chest pain, shortness of breath, any other concerns.

## 2021-10-02 NOTE — ED Notes (Signed)
Pt gone to CT 

## 2021-10-07 ENCOUNTER — Encounter: Payer: Self-pay | Admitting: Adult Health

## 2021-10-07 ENCOUNTER — Ambulatory Visit (INDEPENDENT_AMBULATORY_CARE_PROVIDER_SITE_OTHER): Payer: Medicare HMO | Admitting: Adult Health

## 2021-10-07 VITALS — BP 128/70 | HR 52 | Temp 97.8°F | Ht 67.0 in | Wt 158.0 lb

## 2021-10-07 DIAGNOSIS — M25512 Pain in left shoulder: Secondary | ICD-10-CM

## 2021-10-07 DIAGNOSIS — M542 Cervicalgia: Secondary | ICD-10-CM

## 2021-10-07 DIAGNOSIS — J40 Bronchitis, not specified as acute or chronic: Secondary | ICD-10-CM

## 2021-10-07 NOTE — Progress Notes (Signed)
Subjective:    Patient ID: Todd Watson, male    DOB: December 13, 1940, 81 y.o.   MRN: [id number]  HPI 81 year old male who  has a past medical history of Arthritis, GERD (gastroesophageal reflux disease), Herpes zoster, Hiatal hernia, Hypothyroidism, and Osteoarthritis of lumbar spine.  He was seen in the emergency room 6 days ago with a 2-day history of cough.  He reported coughing up white and yellow sputum.  Denied fevers, chills, nausea, or vomiting.  He did not have any chest pain or shortness of breath.  He is also experiencing left-sided neck pain that was intermittent over the last 2 days without trauma.  He believes he may have slept wrong.  There was no radiation of pain down his arm.  No weakness or numbness.  No headache, no difficulty speaking or difficulty swallowing.  His COVID and flu swabs were negative.  Chest x-ray was negative for infiltrate.  EKG showed no acute ischemia.  CT angiogram neck showed no carotid or vertebral dissection  He was treated for bronchitis with doxycycline and Tessalon Perles  He was then seen in this clinic by another provider on 10/02/2021 for continued left shoulder pain and neck pain.  Xray of Cervical spine showed IMPRESSION: 1. Advanced degenerative disc disease at C5-C6 and C6-C7. 2. No acute cervical spine fracture.  Xray of left shoulder showed IMPRESSION: Moderate severity degenerative changes without evidence of acute fracture or dislocation.    Today he reports that he is feeling much better. He is no longer coughing and his shoulder and back pain has almost resolved with rest and NSAIDS.   Review of Systems See HPI   Past Medical History:  Diagnosis Date   Arthritis    GERD (gastroesophageal reflux disease)    Herpes zoster    Hiatal hernia    Hypothyroidism    Osteoarthritis of lumbar spine     Social History   Socioeconomic History   Marital status: Married    Spouse name: Not on file   Number of children: 3    Years of education: 10   Highest education level: GED or equivalent  Occupational History   Occupation: retired  Tobacco Use   Smoking status: Never   Smokeless tobacco: Never  Substance and Sexual Activity   Alcohol use: No   Drug use: No   Sexual activity: Yes  Other Topics Concern   Not on file  Social History Narrative   Lives with wife.    3 children     6 grandchildren & 5 great grandchildren   Likes to fish    Social Determinants of Radio broadcast assistant Strain: Low Risk    Difficulty of Paying Living Expenses: Not very hard  Food Insecurity: No Food Insecurity   Worried About Charity fundraiser in the Last Year: Never true   Arboriculturist in the Last Year: Never true  Transportation Needs: No Transportation Needs   Lack of Transportation (Medical): No   Lack of Transportation (Non-Medical): No  Physical Activity: Sufficiently Active   Days of Exercise per Week: 5 days   Minutes of Exercise per Session: 30 min  Stress: No Stress Concern Present   Feeling of Stress : Not at all  Social Connections: Socially Integrated   Frequency of Communication with Friends and Family: Three times a week   Frequency of Social Gatherings with Friends and Family: Three times a week   Attends Religious Services: 1  to 4 times per year   Active Member of Clubs or Organizations: Yes   Attends Archivist Meetings: More than 4 times per year   Marital Status: Married  Human resources officer Violence: Not At Risk   Fear of Current or Ex-Partner: No   Emotionally Abused: No   Physically Abused: No   Sexually Abused: No    Past Surgical History:  Procedure Laterality Date   APPENDECTOMY  2000   INGUINAL HERNIA REPAIR Bilateral    LUMBAR LAMINECTOMY/DECOMPRESSION MICRODISCECTOMY Right 01/28/2020   Procedure: Microdiscectomy - right - Lumbar four-Lumbar five;  Surgeon: Kary Kos, MD;  Location: [facility];  Service: Neurosurgery;  Laterality: Right;   SPINE SURGERY     spurs     Family History  Problem Relation Age of Onset   Heart disease Father        Valve disease   Kidney failure Mother     No Known Allergies  Current Outpatient Medications on File Prior to Visit  Medication Sig Dispense Refill   aspirin 81 MG tablet Take 81 mg by mouth daily.     atorvastatin (LIPITOR) 10 MG tablet Take 10 mg by mouth at bedtime.     benzonatate (TESSALON) 100 MG capsule Take 1 capsule (100 mg total) by mouth every 8 (eight) hours. 21 capsule 0   Boswellia-Glucosamine-Vit D (OSTEO BI-FLEX ONE PER DAY PO) Take 1 tablet by mouth daily.     diclofenac Sodium (VOLTAREN) 1 % GEL Apply 1 application topically 4 (four) times daily as needed (pain).     doxycycline (VIBRAMYCIN) 100 MG capsule Take 1 capsule (100 mg total) by mouth 2 (two) times daily. 20 capsule 0   levothyroxine (SYNTHROID) 88 MCG tablet Take 88 mcg by mouth daily before breakfast.     lisinopril (PRINIVIL,ZESTRIL) 10 MG tablet Take 5 mg by mouth in the morning and at bedtime.      Magnesium 250 MG TABS Take by mouth daily.     metFORMIN (GLUCOPHAGE) 500 MG tablet Take by mouth daily with breakfast.     Multiple Vitamin (MULTIVITAMIN) tablet Take 1 tablet by mouth daily.     naproxen sodium (ANAPROX) 220 MG tablet Take 220 mg by mouth daily as needed (pain).      Omega-3 1000 MG CAPS Take 1,000 mg by mouth daily.     omeprazole (PRILOSEC) 20 MG capsule Take 20 mg by mouth in the morning and at bedtime.     Probiotic Product (PROBIOTIC DAILY PO) Take 1 capsule by mouth daily.      psyllium (METAMUCIL) 58.6 % powder Take 1 packet by mouth daily as needed (constipation).      sildenafil (VIAGRA) 100 MG tablet Take 100 mg by mouth as needed for erectile dysfunction.      tamsulosin (FLOMAX) 0.4 MG CAPS capsule Take 0.4 mg by mouth at bedtime.     vitamin C (ASCORBIC ACID) 500 MG tablet Take 500 mg by mouth daily.     No current facility-administered medications on file prior to visit.    BP 128/70    Pulse  (!) 52    Temp 97.8 F (36.6 C) (Oral)    Ht 5\' 7"  (1.702 m)    Wt 158 lb (71.7 kg)    SpO2 96%    BMI 24.75 kg/m       Objective:   Physical Exam Vitals and nursing note reviewed.  Constitutional:      Appearance: Normal appearance.  Cardiovascular:  Rate and Rhythm: Normal rate and regular rhythm.     Pulses: Normal pulses.     Heart sounds: Normal heart sounds.  Pulmonary:     Effort: Pulmonary effort is normal.     Breath sounds: Normal breath sounds.  Musculoskeletal:        General: No swelling, tenderness or deformity. Normal range of motion.  Skin:    General: Skin is warm and dry.     Capillary Refill: Capillary refill takes less than 2 seconds.  Neurological:     General: No focal deficit present.     Mental Status: He is alert and oriented to person, place, and time. Mental status is at baseline.  Psychiatric:        Mood and Affect: Mood normal.        Behavior: Behavior normal.        Thought Content: Thought content normal.        Judgment: Judgment normal.      Assessment & Plan:  1. Bronchitis - Advised to finish abx therapy  - Lungs sounds clear throughout  - Follow up as needed  2. Left shoulder pain, unspecified chronicity - Has normal ROM without pain today   3. Neck pain - Resolved   [name], NP

## 2021-11-09 ENCOUNTER — Telehealth: Payer: Self-pay | Admitting: Pharmacist

## 2021-11-09 NOTE — Chronic Care Management (AMB) (Signed)
Chronic Care Management Pharmacy Assistant   Name: Todd Watson  MRN: [id number] DOB: 1940/12/18  Reason for Encounter: Disease State / Hypertension Assessment Call   Conditions to be addressed/monitored: HTN  Recent office visits:  10/07/2021 Todd Peng NP - Patient was seen for bronchitis and additional issues. No medication changes. No follow up noted.   10/02/2021 Todd Rough MD - Patient was seen for left shoulder pain and an additional issue. No medication changes. Follow up for pending xray.  Recent consult visits:  None  Hospital visits: Patient was seen at [facility] ED on 10/01/2021 (8 hours) due to Cough and additional issue. Discharge date was 10/02/2021.   New?Medications Started at [facility] Discharge:?? Benzonatate 100 mg every 8 hours Doxycycline 100 mg twice daily Medication Changes at Hospital Discharge: -No medication changes Medications Discontinued at Hospital Discharge: -No medications discontinued Medications that remain the same after Hospital Discharge:??  -All other medications will remain the same.    Medications: Outpatient Encounter Medications as of 11/09/2021  Medication Sig   aspirin 81 MG tablet Take 81 mg by mouth daily.   atorvastatin (LIPITOR) 10 MG tablet Take 10 mg by mouth at bedtime.   benzonatate (TESSALON) 100 MG capsule Take 1 capsule (100 mg total) by mouth every 8 (eight) hours.   Boswellia-Glucosamine-Vit D (OSTEO BI-FLEX ONE PER DAY PO) Take 1 tablet by mouth daily.   diclofenac Sodium (VOLTAREN) 1 % GEL Apply 1 application topically 4 (four) times daily as needed (pain).   doxycycline (VIBRAMYCIN) 100 MG capsule Take 1 capsule (100 mg total) by mouth 2 (two) times daily.   levothyroxine (SYNTHROID) 88 MCG tablet Take 88 mcg by mouth daily before breakfast.   lisinopril (PRINIVIL,ZESTRIL) 10 MG tablet Take 5 mg by mouth in the morning and at bedtime.    Magnesium 250 MG TABS Take by mouth daily.   metFORMIN  (GLUCOPHAGE) 500 MG tablet Take by mouth daily with breakfast.   Multiple Vitamin (MULTIVITAMIN) tablet Take 1 tablet by mouth daily.   naproxen sodium (ANAPROX) 220 MG tablet Take 220 mg by mouth daily as needed (pain).    Omega-3 1000 MG CAPS Take 1,000 mg by mouth daily.   omeprazole (PRILOSEC) 20 MG capsule Take 20 mg by mouth in the morning and at bedtime.   Probiotic Product (PROBIOTIC DAILY PO) Take 1 capsule by mouth daily.    psyllium (METAMUCIL) 58.6 % powder Take 1 packet by mouth daily as needed (constipation).    sildenafil (VIAGRA) 100 MG tablet Take 100 mg by mouth as needed for erectile dysfunction.    tamsulosin (FLOMAX) 0.4 MG CAPS capsule Take 0.4 mg by mouth at bedtime.   vitamin C (ASCORBIC ACID) 500 MG tablet Take 500 mg by mouth daily.   No facility-administered encounter medications on file as of 11/09/2021.  Fill History: TADALAFIL 20MG       TAB 09/18/2021 6   TAMSULOSIN HYDROCHLORIDE 0.4MG  CAP 03/12/2021 90   SILDENAFIL 100MG  TAB 04/21/2021 30   Reviewed chart prior to disease state call. Spoke with patient regarding BP  Recent Office Vitals: BP Readings from Last 3 Encounters:  10/07/21 128/70  10/02/21 118/68  10/02/21 (!) 149/83   Pulse Readings from Last 3 Encounters:  10/07/21 (!) 52  10/02/21 66  10/02/21 63    Wt Readings from Last 3 Encounters:  10/07/21 158 lb (71.7 kg)  10/02/21 160 lb 4.8 oz (72.7 kg)  10/01/21 156 lb (70.8 kg)     Kidney  Function Lab Results  Component Value Date/Time   CREATININE 0.87 10/01/2021 11:37 PM   CREATININE 0.90 12/11/2020 10:32 AM   GFR 79.78 12/11/2020 10:32 AM   GFRNONAA >60 10/01/2021 11:37 PM   GFRAA >60 01/28/2020 12:23 PM    BMP Latest Ref Rng & Units 10/01/2021 12/11/2020 01/28/2020  Glucose 70 - 99 mg/dL 125(H) 124(H) 147(H)  BUN 8 - 23 mg/dL 20 18 18   Creatinine 0.61 - 1.24 mg/dL 0.87 0.90 0.81  Sodium 135 - 145 mmol/L 139 140 138  Potassium 3.5 - 5.1 mmol/L 4.2 4.8 4.1  Chloride 98 - 111  mmol/L 103 103 100  CO2 22 - 32 mmol/L 27 30 24   Calcium 8.9 - 10.3 mg/dL 9.2 9.4 9.2    Current antihypertensive regimen:  Lisinopril 10 mg take 1/2 tablet twice daily  How often are you checking your Blood Pressure? Patient is checking blood pressures infrequently   Current home BP readings: Patient checked his blood pressure today it was 115/66  What recent interventions/DTPs have been made by any provider to improve Blood Pressure control since last CPP Visit: No recent interventions.   Any recent hospitalizations or ED visits since last visit with CPP? Yes, patient was seen at the ED for Bronchitis on 10/01/2021.  What diet changes have been made to improve Blood Pressure Control?  Patient tries to not eat sugars or foods with sugars Breakfast - Egg and cheese biscuit or cereal Lunch - sandwich if he is [place] Dinner - generally eats vegetables and a meat  What exercise is being done to improve your Blood Pressure Control?  Patient works around his house, splits and stacks his wood when his is not in [place], while he is in [place] he will walk daily.  Adherence Review: Is the patient currently on ACE/ARB medication? Yes Does the patient have >5 day gap between last estimated fill dates? Filled at [facility]: AWV - complete 06/02/2021 Last BP - 128/70 on 10/07/2021 Last A1C - 6.3 on 06/23/2021 Pneumovax - overdue Eye exam - overdue Covid vaccine - overdue  Star Rating Drugs: Atorvastatin 10 mg - no fill history Lisinopril 10 mg - no fill history Metformin 500 mg - no fill history Per patient he gets most of his meds filled at the [place] Pharmacist Assistant [phone]

## 2021-12-24 DIAGNOSIS — E039 Hypothyroidism, unspecified: Secondary | ICD-10-CM | POA: Diagnosis not present

## 2021-12-24 DIAGNOSIS — M1990 Unspecified osteoarthritis, unspecified site: Secondary | ICD-10-CM | POA: Diagnosis not present

## 2021-12-24 DIAGNOSIS — K219 Gastro-esophageal reflux disease without esophagitis: Secondary | ICD-10-CM | POA: Diagnosis not present

## 2021-12-24 DIAGNOSIS — E785 Hyperlipidemia, unspecified: Secondary | ICD-10-CM | POA: Diagnosis not present

## 2021-12-24 DIAGNOSIS — Z7982 Long term (current) use of aspirin: Secondary | ICD-10-CM | POA: Diagnosis not present

## 2021-12-24 DIAGNOSIS — E119 Type 2 diabetes mellitus without complications: Secondary | ICD-10-CM | POA: Diagnosis not present

## 2021-12-24 DIAGNOSIS — I10 Essential (primary) hypertension: Secondary | ICD-10-CM | POA: Diagnosis not present

## 2021-12-24 DIAGNOSIS — N529 Male erectile dysfunction, unspecified: Secondary | ICD-10-CM | POA: Diagnosis not present

## 2021-12-24 DIAGNOSIS — I700 Atherosclerosis of aorta: Secondary | ICD-10-CM | POA: Diagnosis not present

## 2021-12-24 DIAGNOSIS — N400 Enlarged prostate without lower urinary tract symptoms: Secondary | ICD-10-CM | POA: Diagnosis not present

## 2021-12-24 DIAGNOSIS — G8929 Other chronic pain: Secondary | ICD-10-CM | POA: Diagnosis not present

## 2021-12-24 DIAGNOSIS — Z008 Encounter for other general examination: Secondary | ICD-10-CM | POA: Diagnosis not present

## 2021-12-24 DIAGNOSIS — Z791 Long term (current) use of non-steroidal anti-inflammatories (NSAID): Secondary | ICD-10-CM | POA: Diagnosis not present

## 2022-01-06 ENCOUNTER — Telehealth: Payer: Self-pay

## 2022-01-06 NOTE — Telephone Encounter (Signed)
Last OV for medical conditions 06/23/21.  ?Attempted to call pt to schedule CPE with labs at earliest convenience.   ?

## 2022-01-07 NOTE — Telephone Encounter (Signed)
Pt has a CPE May 9. ?

## 2022-01-22 ENCOUNTER — Encounter: Payer: Self-pay | Admitting: Adult Health

## 2022-01-22 ENCOUNTER — Ambulatory Visit (INDEPENDENT_AMBULATORY_CARE_PROVIDER_SITE_OTHER): Payer: Medicare HMO | Admitting: Adult Health

## 2022-01-22 VITALS — BP 130/68 | HR 50 | Temp 98.3°F | Ht 67.0 in | Wt 157.0 lb

## 2022-01-22 DIAGNOSIS — T148XXA Other injury of unspecified body region, initial encounter: Secondary | ICD-10-CM

## 2022-01-22 MED ORDER — METHYLPREDNISOLONE 4 MG PO TBPK: ORAL_TABLET | ORAL | 0 refills | Status: DC

## 2022-01-22 NOTE — Progress Notes (Signed)
? ?Subjective:  ? ? Patient ID: Todd Watson, male    DOB: 1941/05/28, 81 y.o.   MRN: [id number] ? ?HPI ?81 year old male who  has a past medical history of Arthritis, GERD (gastroesophageal reflux disease), Herpes zoster, Hiatal hernia, Hypothyroidism, and Osteoarthritis of lumbar spine. ? ?He presents to the office today for an acute issue.  Patient reports "I did something stupid, I was digging a ditch with my back telling him across the piece of cement.  I got out of the [place] and tried to move the piece of cement and when I did I injured my back.".  Happens approximately 4 days ago.  He has been using Aleve at home which helps to some degree but with certain positions or activities it causes right upper back pain. ? ? ?Review of Systems ?See HPI  ? ?Past Medical History:  ?Diagnosis Date  ? Arthritis   ? GERD (gastroesophageal reflux disease)   ? Herpes zoster   ? Hiatal hernia   ? Hypothyroidism   ? Osteoarthritis of lumbar spine   ? ? ?Social History  ? ?Socioeconomic History  ? Marital status: Married  ?  Spouse name: Not on file  ? Number of children: 3  ? Years of education: 10  ? Highest education level: GED or equivalent  ?Occupational History  ? Occupation: retired  ?Tobacco Use  ? Smoking status: Never  ? Smokeless tobacco: Never  ?Substance and Sexual Activity  ? Alcohol use: No  ? Drug use: No  ? Sexual activity: Yes  ?Other Topics Concern  ? Not on file  ?Social History Narrative  ? Lives with wife.   ? 3 children   ?  6 grandchildren & 5 great grandchildren  ? Likes to fish   ? ?Social Determinants of Health  ? ?Financial Resource Strain: Low Risk   ? Difficulty of Paying Living Expenses: Not very hard  ?Food Insecurity: No Food Insecurity  ? Worried About Charity fundraiser in the Last Year: Never true  ? Ran Out of Food in the Last Year: Never true  ?Transportation Needs: No Transportation Needs  ? Lack of Transportation (Medical): No  ? Lack of Transportation (Non-Medical): No  ?Physical  Activity: Sufficiently Active  ? Days of Exercise per Week: 5 days  ? Minutes of Exercise per Session: 30 min  ?Stress: No Stress Concern Present  ? Feeling of Stress : Not at all  ?Social Connections: Socially Integrated  ? Frequency of Communication with Friends and Family: Three times a week  ? Frequency of Social Gatherings with Friends and Family: Three times a week  ? Attends Religious Services: 1 to 4 times per year  ? Active Member of Clubs or Organizations: Yes  ? Attends Archivist Meetings: More than 4 times per year  ? Marital Status: Married  ?Intimate Partner Violence: Not At Risk  ? Fear of Current or Ex-Partner: No  ? Emotionally Abused: No  ? Physically Abused: No  ? Sexually Abused: No  ? ? ?Past Surgical History:  ?Procedure Laterality Date  ? APPENDECTOMY  2000  ? INGUINAL HERNIA REPAIR Bilateral   ? LUMBAR LAMINECTOMY/DECOMPRESSION MICRODISCECTOMY Right 01/28/2020  ? Procedure: Microdiscectomy - right - Lumbar four-Lumbar five;  Surgeon: Kary Kos, MD;  Location: [facility];  Service: Neurosurgery;  Laterality: Right;  ? SPINE SURGERY    ? spurs  ? ? ?Family History  ?Problem Relation Age of Onset  ? Heart disease Father   ?  Valve disease  ? Kidney failure Mother   ? ? ?No Known Allergies ? ?Current Outpatient Medications on File Prior to Visit  ?Medication Sig Dispense Refill  ? aspirin 81 MG tablet Take 81 mg by mouth daily.    ? atorvastatin (LIPITOR) 10 MG tablet Take 10 mg by mouth at bedtime.    ? Boswellia-Glucosamine-Vit D (OSTEO BI-FLEX ONE PER DAY PO) Take 1 tablet by mouth daily.    ? diclofenac Sodium (VOLTAREN) 1 % GEL Apply 1 application topically 4 (four) times daily as needed (pain).    ? levothyroxine (SYNTHROID) 88 MCG tablet Take 88 mcg by mouth daily before breakfast.    ? lisinopril (PRINIVIL,ZESTRIL) 10 MG tablet Take 5 mg by mouth in the morning and at bedtime.     ? Magnesium 250 MG TABS Take by mouth daily.    ? metFORMIN (GLUCOPHAGE) 500 MG tablet Take by mouth  daily with breakfast.    ? Multiple Vitamin (MULTIVITAMIN) tablet Take 1 tablet by mouth daily.    ? naproxen sodium (ANAPROX) 220 MG tablet Take 220 mg by mouth daily as needed (pain).     ? Omega-3 1000 MG CAPS Take 1,000 mg by mouth daily.    ? omeprazole (PRILOSEC) 20 MG capsule Take 20 mg by mouth in the morning and at bedtime.    ? Probiotic Product (PROBIOTIC DAILY PO) Take 1 capsule by mouth daily.     ? psyllium (METAMUCIL) 58.6 % powder Take 1 packet by mouth daily as needed (constipation).     ? sildenafil (VIAGRA) 100 MG tablet Take 100 mg by mouth as needed for erectile dysfunction.     ? tamsulosin (FLOMAX) 0.4 MG CAPS capsule Take 0.4 mg by mouth at bedtime.    ? vitamin C (ASCORBIC ACID) 500 MG tablet Take 500 mg by mouth daily.    ? ?No current facility-administered medications on file prior to visit.  ? ? ?BP 130/68   Pulse (!) 50   Temp 98.3 ?F (36.8 ?C) (Oral)   Ht '5\' 7"'$  (1.702 m)   Wt 157 lb (71.2 kg)   SpO2 98%   BMI 24.59 kg/m?  ? ? ?   ?Objective:  ? Physical Exam ?Vitals and nursing note reviewed.  ?Constitutional:   ?   Appearance: Normal appearance.  ?Musculoskeletal:     ?   General: Tenderness present. Normal range of motion.  ?     Back: ? ?Skin: ?   General: Skin is warm and dry.  ?Neurological:  ?   General: No focal deficit present.  ?   Mental Status: He is alert and oriented to person, place, and time.  ?Psychiatric:     ?   Mood and Affect: Mood normal.     ?   Behavior: Behavior normal.     ?   Thought Content: Thought content normal.  ? ?   ?Assessment & Plan:  ?1. Muscle strain ?- methylPREDNISolone (MEDROL DOSEPAK) 4 MG TBPK tablet; Take as directed  Dispense: 21 tablet; Refill: 0 ?- Can continue to take Aleve as needed ?- Warm compresses and gentle stretching exercises ? ? ?[name], NP ? ? ?

## 2022-02-03 ENCOUNTER — Ambulatory Visit (INDEPENDENT_AMBULATORY_CARE_PROVIDER_SITE_OTHER): Payer: Medicare HMO

## 2022-02-03 ENCOUNTER — Ambulatory Visit (INDEPENDENT_AMBULATORY_CARE_PROVIDER_SITE_OTHER): Payer: Medicare HMO | Admitting: Orthopaedic Surgery

## 2022-02-03 DIAGNOSIS — G8929 Other chronic pain: Secondary | ICD-10-CM | POA: Diagnosis not present

## 2022-02-03 DIAGNOSIS — M25511 Pain in right shoulder: Secondary | ICD-10-CM

## 2022-02-03 NOTE — Progress Notes (Signed)
The patient is an 81 year old gentleman who comes in today with what he describes right shoulder pain but he points to the scapula and between his shoulder blades a source of his pain.  He is active and he was working chopping wood and this is when the place flared up on him.  His primary care physician appropriate tried a Dosepak and he said that was very helpful but his shoulder is hurting again but again is not his shoulder he describes what is hurting. ? ?On exam his right shoulder moves smoothly and fluidly.  His pain is along the medial border of the scapula at the mid thoracic spine areas where he hurts.  This is more of a trigger point pain and probably some muscle strain. ? ?X-rays of his right shoulder show no acute findings. ? ?I did talk about physical therapy but he said this is something he really cannot afford it is too expensive for him when he has had this for his back before.  I did offer him a trigger point injection in the area of where he was hurting the most and he agreed to this and tolerated it well.  I also recommended Voltaren gel.  All question concerns were answered and addressed.  Follow-up is as needed. ?

## 2022-02-09 ENCOUNTER — Encounter: Payer: Self-pay | Admitting: Adult Health

## 2022-02-09 ENCOUNTER — Ambulatory Visit (INDEPENDENT_AMBULATORY_CARE_PROVIDER_SITE_OTHER): Payer: Medicare HMO | Admitting: Adult Health

## 2022-02-09 VITALS — BP 120/68 | HR 53 | Temp 98.0°F | Ht 66.5 in | Wt 156.0 lb

## 2022-02-09 DIAGNOSIS — M159 Polyosteoarthritis, unspecified: Secondary | ICD-10-CM

## 2022-02-09 DIAGNOSIS — E039 Hypothyroidism, unspecified: Secondary | ICD-10-CM | POA: Diagnosis not present

## 2022-02-09 DIAGNOSIS — M150 Primary generalized (osteo)arthritis: Secondary | ICD-10-CM

## 2022-02-09 DIAGNOSIS — Z0000 Encounter for general adult medical examination without abnormal findings: Secondary | ICD-10-CM | POA: Diagnosis not present

## 2022-02-09 DIAGNOSIS — I10 Essential (primary) hypertension: Secondary | ICD-10-CM | POA: Diagnosis not present

## 2022-02-09 DIAGNOSIS — E1169 Type 2 diabetes mellitus with other specified complication: Secondary | ICD-10-CM | POA: Diagnosis not present

## 2022-02-09 DIAGNOSIS — E782 Mixed hyperlipidemia: Secondary | ICD-10-CM

## 2022-02-09 DIAGNOSIS — T148XXA Other injury of unspecified body region, initial encounter: Secondary | ICD-10-CM

## 2022-02-09 MED ORDER — DOXYCYCLINE HYCLATE 100 MG PO CAPS: 100.0000 mg | ORAL_CAPSULE | Freq: Two times a day (BID) | ORAL | 0 refills | Status: DC

## 2022-02-09 NOTE — Progress Notes (Signed)
Subjective:    Patient ID: Todd Watson, male    DOB: Dec 18, 1940, 81 y.o.   MRN: [id number]  HPI Patient presents for yearly preventative medicine examination. He is a pleasant 81 year old male who  has a past medical history of Arthritis, GERD (gastroesophageal reflux disease), Herpes zoster, Hiatal hernia, Hypothyroidism, and Osteoarthritis of lumbar spine.  He gets most of his care done at the [facility] - was last seen there on 01/13/2022  Hyperlipidemia-prescribed Lipitor 10 mg at bedtime and aspirin 81 mg. Lab Results  Component Value Date   CHOL 144 12/11/2020   HDL 44.70 12/11/2020   LDLCALC 84 12/11/2020   TRIG 76.0 12/11/2020   CHOLHDL 3 12/11/2020   Hypertension-prescribed lisinopril 5 mg in the morning and 5 mg in the evening.  He denies dizziness, lightheadedness, chest pain, or shortness of breath BP Readings from Last 3 Encounters:  02/09/22 120/68  01/22/22 130/68  10/07/21 128/70    Diabetes mellitus type II-managed with metformin 500 mg daily at breakfast.  He denies hypoglycemic events.  He does not check his blood sugar on a routine basis. His A1c at the [facility] was 6.2  Lab Results  Component Value Date   HGBA1C 6.3 (A) 06/23/2021   Hypothyroidism-managed with Synthroid 88 mcg daily. His last TSH was 2.51 at the [facility]  Lab Results  Component Value Date   TSH 2.17 12/11/2020   BPH-controlled on Flomax 0.4 mg daily  Osteoarthritis-mostly in his hips and back.  He does use anti-inflammatory medication as needed and this seems to work well for him  All immunizations and health maintenance protocols were reviewed with the patient and needed orders were placed.  Appropriate screening laboratory values were ordered for the patient including screening of hyperlipidemia, renal function and hepatic function.   Medication reconciliation,  past medical history, social history, problem list and allergies were reviewed in detail with the patient  Goals were established with  regard to weight loss, exercise, and  diet in compliance with medications  His only acute complaint is that of a blister on his right heel. He reports that he got new work boots and they were too small and have been rubbing. He has not noticed any signs of infection. Has been keeping the blister covered   Review of Systems  Constitutional: Negative.   HENT: Negative.    Eyes: Negative.   Respiratory: Negative.    Cardiovascular: Negative.   Gastrointestinal: Negative.   Endocrine: Negative.   Genitourinary: Negative.   Musculoskeletal: Negative.   Skin:  Positive for wound.  Allergic/Immunologic: Negative.   Neurological: Negative.   Hematological: Negative.   Psychiatric/Behavioral: Negative.    All other systems reviewed and are negative.  Past Medical History:  Diagnosis Date   Arthritis    GERD (gastroesophageal reflux disease)    Herpes zoster    Hiatal hernia    Hypothyroidism    Osteoarthritis of lumbar spine     Social History   Socioeconomic History   Marital status: Married    Spouse name: Not on file   Number of children: 3   Years of education: 10   Highest education level: GED or equivalent  Occupational History   Occupation: retired  Tobacco Use   Smoking status: Never   Smokeless tobacco: Never  Substance and Sexual Activity   Alcohol use: No   Drug use: No   Sexual activity: Yes  Other Topics Concern   Not on file  Social History  Narrative   Lives with wife.    3 children     6 grandchildren & 5 great grandchildren   Likes to fish    Social Determinants of Corporate investment banker Strain: Low Risk    Difficulty of Paying Living Expenses: Not very hard  Food Insecurity: No Food Insecurity   Worried About Programme researcher, broadcasting/film/video in the Last Year: Never true   Barista in the Last Year: Never true  Transportation Needs: No Transportation Needs   Lack of Transportation (Medical): No   Lack of Transportation (Non-Medical): No   Physical Activity: Sufficiently Active   Days of Exercise per Week: 5 days   Minutes of Exercise per Session: 30 min  Stress: No Stress Concern Present   Feeling of Stress : Not at all  Social Connections: Socially Integrated   Frequency of Communication with Friends and Family: Three times a week   Frequency of Social Gatherings with Friends and Family: Three times a week   Attends Religious Services: 1 to 4 times per year   Active Member of Clubs or Organizations: Yes   Attends Engineer, structural: More than 4 times per year   Marital Status: Married  Catering manager Violence: Not At Risk   Fear of Current or Ex-Partner: No   Emotionally Abused: No   Physically Abused: No   Sexually Abused: No    Past Surgical History:  Procedure Laterality Date   APPENDECTOMY  2000   INGUINAL HERNIA REPAIR Bilateral    LUMBAR LAMINECTOMY/DECOMPRESSION MICRODISCECTOMY Right 01/28/2020   Procedure: Microdiscectomy - right - Lumbar four-Lumbar five;  Surgeon: Donalee Citrin, MD;  Location: [facility] OR;  Service: Neurosurgery;  Laterality: Right;   SPINE SURGERY     spurs    Family History  Problem Relation Age of Onset   Heart disease Father        Valve disease   Kidney failure Mother     No Known Allergies  Current Outpatient Medications on File Prior to Visit  Medication Sig Dispense Refill   aspirin 81 MG tablet Take 81 mg by mouth daily.     atorvastatin (LIPITOR) 10 MG tablet Take 10 mg by mouth at bedtime.     Boswellia-Glucosamine-Vit D (OSTEO BI-FLEX ONE PER DAY PO) Take 1 tablet by mouth daily.     diclofenac Sodium (VOLTAREN) 1 % GEL Apply 1 application topically 4 (four) times daily as needed (pain).     levothyroxine (SYNTHROID) 88 MCG tablet Take 88 mcg by mouth daily before breakfast.     lisinopril (PRINIVIL,ZESTRIL) 10 MG tablet Take 5 mg by mouth in the morning and at bedtime.      Magnesium 250 MG TABS Take by mouth daily.     metFORMIN (GLUCOPHAGE) 500 MG tablet  Take by mouth daily with breakfast.     methylPREDNISolone (MEDROL DOSEPAK) 4 MG TBPK tablet Take as directed 21 tablet 0   Multiple Vitamin (MULTIVITAMIN) tablet Take 1 tablet by mouth daily.     naproxen sodium (ANAPROX) 220 MG tablet Take 220 mg by mouth daily as needed (pain).      Omega-3 1000 MG CAPS Take 1,000 mg by mouth daily.     omeprazole (PRILOSEC) 20 MG capsule Take 20 mg by mouth in the morning and at bedtime.     Probiotic Product (PROBIOTIC DAILY PO) Take 1 capsule by mouth daily.      psyllium (METAMUCIL) 58.6 % powder Take 1 packet  by mouth daily as needed (constipation).      sildenafil (VIAGRA) 100 MG tablet Take 100 mg by mouth as needed for erectile dysfunction.      tamsulosin (FLOMAX) 0.4 MG CAPS capsule Take 0.4 mg by mouth at bedtime.     vitamin C (ASCORBIC ACID) 500 MG tablet Take 500 mg by mouth daily.     No current facility-administered medications on file prior to visit.    BP 120/68   Pulse (!) 53   Temp 98 F (36.7 C) (Oral)   Ht 5' 6.5" (1.689 m)   Wt 156 lb (70.8 kg)   SpO2 97%   BMI 24.80 kg/m       Objective:   Physical Exam Vitals and nursing note reviewed.  Constitutional:      General: He is not in acute distress.    Appearance: Normal appearance. He is well-developed and normal weight.  HENT:     Head: Normocephalic and atraumatic.     Right Ear: Tympanic membrane, ear canal and external ear normal. There is no impacted cerumen.     Left Ear: Tympanic membrane, ear canal and external ear normal. There is no impacted cerumen.     Nose: Nose normal. No congestion or rhinorrhea.     Mouth/Throat:     Mouth: Mucous membranes are moist.     Pharynx: Oropharynx is clear. No oropharyngeal exudate or posterior oropharyngeal erythema.  Eyes:     General:        Right eye: No discharge.        Left eye: No discharge.     Extraocular Movements: Extraocular movements intact.     Conjunctiva/sclera: Conjunctivae normal.     Pupils: Pupils  are equal, round, and reactive to light.  Neck:     Vascular: No carotid bruit.     Trachea: No tracheal deviation.  Cardiovascular:     Rate and Rhythm: Normal rate and regular rhythm.     Pulses: Normal pulses.     Heart sounds: Normal heart sounds. No murmur heard.   No friction rub. No gallop.  Pulmonary:     Effort: Pulmonary effort is normal. No respiratory distress.     Breath sounds: Normal breath sounds. No stridor. No wheezing, rhonchi or rales.  Chest:     Chest wall: No tenderness.  Abdominal:     General: Bowel sounds are normal. There is no distension.     Palpations: Abdomen is soft. There is no mass.     Tenderness: There is no abdominal tenderness. There is no right CVA tenderness, left CVA tenderness, guarding or rebound.     Hernia: No hernia is present.  Musculoskeletal:        General: No swelling, tenderness, deformity or signs of injury. Normal range of motion.     Right lower leg: No edema.     Left lower leg: No edema.  Lymphadenopathy:     Cervical: No cervical adenopathy.  Skin:    General: Skin is warm and dry.     Capillary Refill: Capillary refill takes less than 2 seconds.     Coloration: Skin is not jaundiced or pale.     Findings: Wound present. No bruising, erythema, lesion or rash.     Comments: 1 inch blister on right heel. No sighs of infection   Neurological:     General: No focal deficit present.     Mental Status: He is alert and oriented to person, place, and  time.     Cranial Nerves: No cranial nerve deficit.     Sensory: No sensory deficit.     Motor: No weakness.     Coordination: Coordination normal.     Gait: Gait normal.     Deep Tendon Reflexes: Reflexes normal.  Psychiatric:        Mood and Affect: Mood normal.        Behavior: Behavior normal.        Thought Content: Thought content normal.        Judgment: Judgment normal.      Assessment & Plan:  1. Routine general medical examination at a health care facility -  Remarkable 81 year old male. Continue to stay active and eat healthy  - Reviewed labs that were done recently at the [facility].  - Will hold off on additional labs at this time   2. Essential hypertension, benign - Well controlled.  - No change in medications   3. Type 2 diabetes mellitus with other specified complication, without long-term current use of insulin (HCC) - [facility] with [facility]  4. Hypothyroidism, unspecified type [facility]  5. Mixed hyperlipidemia [facility]  6. Primary osteoarthritis involving multiple joints - Can continue with NSAIDS  7. Blister - Will cover with doxycycline 100 mg BID x 7 days  - Follow up with signs of infection    Shirline Frees, NP

## 2022-02-10 ENCOUNTER — Telehealth: Payer: Self-pay | Admitting: Adult Health

## 2022-02-10 NOTE — Telephone Encounter (Signed)
Tried to call pt no answer vm full.  ?

## 2022-02-10 NOTE — Telephone Encounter (Signed)
Patient notified of update  and verbalized understanding. 

## 2022-02-10 NOTE — Telephone Encounter (Signed)
Pt call and stated he saw [facility] yesterday and want him to send him something for leg cramps to  ?[facility] - [place], [place] - [address] Phone:  [phone]  ?Fax:  [phone]  ?  ? ?

## 2022-02-11 NOTE — Patient Instructions (Signed)
Health Maintenance Due  ?Topic Date Due  ? OPHTHALMOLOGY EXAM  12/04/2020  ? ? ? ? Row Labels 02/09/2022  ?  7:15 AM 10/02/2021  ?  1:46 PM 06/02/2021  ? 10:35 AM  ?Depression screen PHQ 2/9   Section Header. No data exists in this row.     ?Decreased Interest   0 0 0  ?Down, Depressed, Hopeless   0 0 0  ?PHQ - 2 Score   0 0 0  ?Altered sleeping   0 2   ?Tired, decreased energy   0 0   ?Change in appetite   0 0   ?Feeling bad or failure about yourself    0 0   ?Trouble concentrating   0 0   ?Moving slowly or fidgety/restless   0 0   ?Suicidal thoughts   0 0   ?PHQ-9 Score   0 2   ?Difficult doing work/chores   Not difficult at all    ? ? ?

## 2022-02-16 LAB — HM DIABETES EYE EXAM

## 2022-02-19 ENCOUNTER — Other Ambulatory Visit: Payer: Self-pay | Admitting: Adult Health

## 2022-02-19 DIAGNOSIS — E1169 Type 2 diabetes mellitus with other specified complication: Secondary | ICD-10-CM

## 2022-02-19 DIAGNOSIS — I10 Essential (primary) hypertension: Secondary | ICD-10-CM

## 2022-02-23 ENCOUNTER — Telehealth: Payer: Self-pay | Admitting: Pharmacist

## 2022-02-23 NOTE — Chronic Care Management (AMB) (Signed)
    Chronic Care Management Pharmacy Assistant   Name: Todd Watson  MRN: [id number] DOB: 1941/07/22  02/24/2022 APPOINTMENT REMINDER  Georgian Co Neuzil was reminded to have all medications, supplements and any blood glucose and blood pressure readings available for review with [name], Pharm. D, at his telephone visit on 02/24/2022 at 3:00.   Care Gaps: AWV - message sent to Ramond Craver Last BP - 120/68 on 02/09/2022 Last A1C - 6.3 on 06/23/2021 Eye exam - overdue  Star Rating Drug: Atorvastatin 10 mg - filled at [facility] Lisinopril 10 mg - filled at [facility] Metformin 500 mg - filled at [facility]  Any gaps in medications fill history? [facility] Pharmacist Assistant [phone]

## 2022-02-24 ENCOUNTER — Ambulatory Visit (INDEPENDENT_AMBULATORY_CARE_PROVIDER_SITE_OTHER): Payer: Medicare HMO | Admitting: Pharmacist

## 2022-02-24 DIAGNOSIS — I10 Essential (primary) hypertension: Secondary | ICD-10-CM

## 2022-02-24 DIAGNOSIS — E039 Hypothyroidism, unspecified: Secondary | ICD-10-CM

## 2022-02-24 NOTE — Patient Instructions (Signed)
Hi Phinehas,  It was great to catch up!  Please reach out to me if you have any questions or need anything before our follow up!  Best, Maddie  [name], PharmD, [place] Pharmacist [facility]   Visit Information   Goals Addressed   None    Patient Care Plan: CCM Pharmacy Care Plan     Problem Identified: Problem: Hypertension, Hyperlipidemia, Diabetes, GERD, Hypothyroidism, and BPH      Long-Range Goal: Patient-Specific Goal   Start Date: 08/31/2021  Expected End Date: 08/31/2022  Recent Progress: On track  Priority: High  Note:   Current Barriers:  Unable to independently monitor therapeutic efficacy  Pharmacist Clinical Goal(s):  Patient will achieve adherence to monitoring guidelines and medication adherence to achieve therapeutic efficacy through collaboration with PharmD and provider.   Interventions: 1:1 collaboration with [name], NP regarding development and update of comprehensive plan of care as evidenced by provider attestation and co-signature Inter-disciplinary care team collaboration (see longitudinal plan of care) Comprehensive medication review performed; medication list updated in electronic medical record  Hypertension (BP goal <140/90) -Controlled -Current treatment: Lisinopril 10 mg 1/2 tablet twice daily - Appropriate, Effective, Safe, Accessible -Medications previously tried: unknown  -Current home readings: has an arm cuff that he pumps up himself (does not use often) -Current dietary habits: doesn't add much salt to food but eats out at [employer] for breakfast often; wife doesn't like to cook much anymore -Current exercise habits: active with chopping wood -Denies hypotensive/hypertensive symptoms -Educated on BP goals and benefits of medications for prevention of heart attack, stroke and kidney damage; Daily salt intake goal < 2300 mg; Importance of home blood pressure monitoring; Proper  BP monitoring technique; -Counseled to monitor BP at home weekly, document, and provide log at future appointments -Counseled on diet and exercise extensively Recommended to continue current medication  Hyperlipidemia: (LDL goal < 100) -Controlled -Current treatment: Atorvastatin 10 mg 1 tablet at bedtime - Appropriate, Effective, Safe, Accessible Omega 3 fish oil 1000 mg 1 capsule daily - Appropriate, Effective, Safe, Accessible -Medications previously tried: none  -Current dietary patterns: eats out more often than he should -Current exercise habits: active around the house -Educated on Cholesterol goals;  Benefits of statin for ASCVD risk reduction; -Counseled on diet and exercise extensively Recommended to continue current medication Recommended stopping fish oil given risk vs benefit  Diabetes (A1c goal <7%) -Controlled -Current medications: Metformin 500 mg daily with breakfast - Appropriate, Effective, Safe, Accessible -Medications previously tried: none  -Current home glucose readings fasting glucose: hard to check blood sugars post prandial glucose: n/a -Denies hypoglycemic/hyperglycemic symptoms -Current meal patterns:  breakfast: [employer] sausage, egg & biscuit  lunch: n/a  dinner: n/a snacks: n/a drinks: coffee in AM -Current exercise: active around the house -Educated on A1c and blood sugar goals; Benefits of routine self-monitoring of blood sugar; Continuous glucose monitoring; Carbohydrate counting and/or plate method -Counseled to check feet daily and get yearly eye exams -Counseled on diet and exercise extensively Recommended to continue current medication Recommended for patient to inquire about CGM with the [place].  Hypothyroidism (Goal: TSH 0.35-4.5) -Controlled -Current treatment  Levothyroxine 88 mcg 1 tablet daily before breakfast - Appropriate, Effective, Safe, Accessible -Medications previously tried: none  -Recommended to continue current  medication  Osteoarthritis (Goal: minimize pain) -Controlled -Current treatment  Osteo Biflex daily - Appropriate, Effective, Safe, Accessible Diclofenac gel 1% as needed - Appropriate, Effective, Safe, Accessible Naproxen 220 mg daily as needed -  Appropriate, Effective, Safe, Accessible -Medications previously tried: none  -Recommended limiting use of NSAIDs.  BPH (Goal: minimize symptoms) -Controlled -Current treatment  Tamsulosin 0.4 mg 1 capsule daily - Appropriate, Effective, Safe, Accessible -Medications previously tried: none  -Recommended to continue current medication  GERD/hiatal hernia (Goal: minimize symptoms) -Controlled -Current treatment  Omeprazole 20 mg 1 capsule twice daily - only taking in the morning - Appropriate, Effective, Safe, Accessible -Medications previously tried: none  -Recommended to continue current medication   Health Maintenance -Vaccine gaps: tetanus, pneumonia, COVID booster -Current therapy:  Aspirin 81 mg 1 tablet daily Multivitamin 1 tablet daily Probiotic 1 tablet daily Metamucil powder as needed - every 3 days Sildenafil 100 mg as needed Vitamin C 500 mg 1 tablet daily Magnesium 250 mg 1 tablet daily -Educated on Cost vs benefit of each product must be carefully weighed by individual consumer -Patient is satisfied with current therapy and denies issues -Recommended to continue current medication  Patient Goals/Self-Care Activities Patient will:  - take medications as prescribed as evidenced by patient report and record review check blood pressure weekly, document, and provide at future appointments  Follow Up Plan: Telephone follow up appointment with care management team member scheduled for: 6 months       Patient verbalizes understanding of instructions and care plan provided today and agrees to view in [place]. Active MyChart status and patient understanding of how to access instructions and care plan via MyChart confirmed  with patient.    The pharmacy team will reach out to the patient again over the next 90 days.   Viona Gilmore, [facility]

## 2022-02-24 NOTE — Progress Notes (Signed)
Chronic Care Management Pharmacy Note  02/24/2022 Name:  Todd Watson MRN:  [id number] DOB:  09-Sep-1941  Summary: BP is at goal < 140/90  Recommendations/Changes made from today's visit: -Recommended weekly/bi-weekly BP monitoring at home -Recommended stopping aspirin due to risk of bleeding and no clinical ASCVD and removed from med list  Plan: Follow up BP assessment in 3 months Follow up in 6 months  Subjective: Todd Watson is an 81 y.o. year old male who is a primary patient of [name], NP.  The CCM team was consulted for assistance with disease management and care coordination needs.    Engaged with patient by telephone for follow up visit in response to provider referral for pharmacy case management and/or care coordination services.   Consent to Services:  The patient was given information about Chronic Care Management services, agreed to services, and gave verbal consent prior to initiation of services.  Please see initial visit note for detailed documentation.   Patient Care Team: [name], NP as PCP - General (Family Medicine) Kary Kos, MD as Consulting Physician (Neurosurgery) Viona Gilmore, [facility] as Pharmacist (Pharmacist)  Recent office visits: 02/09/22 [name], NP: Patient presented for annual exam. Prescribed doxycycline BID x 7 days for blister.  01/22/22 [name], NP: Patient presented for muscle strain. Prescribed medrol dosepak.  10/07/2021 [name] NP - Patient was seen for bronchitis and additional issues. No medication changes. No follow up noted.    10/02/2021 [name] MD - Patient was seen for left shoulder pain and an additional issue. No medication changes. Follow up for pending xray.  Recent consult visits: 02/16/22 Noelle Penner ([facility]): Patient presented for eye exam.   01/13/22 Charlyne Petrin, MD ([place] PCP): [facility]  02/03/22 [name], MD (ortho):  Patient presented for right shoulder pain. Trigger point injection administered and recommended Voltaren gel for pain.  04/20/21 Lenda Kelp (dermatology): Unable to access notes.  03/12/21 [name] (urology): Unable to access notes.  Hospital visits: 10/07/2021 [name] NP - Patient was seen for bronchitis and additional issues. No medication changes. No follow up noted.    10/02/2021 [name] MD - Patient was seen for left shoulder pain and an additional issue. No medication changes. Follow up for pending xray.   Objective:  Lab Results  Component Value Date   CREATININE 0.87 10/01/2021   BUN 20 10/01/2021   GFR 79.78 12/11/2020   GFRNONAA >60 10/01/2021   GFRAA >60 01/28/2020   NA 139 10/01/2021   K 4.2 10/01/2021   CALCIUM 9.2 10/01/2021   CO2 27 10/01/2021   GLUCOSE 125 (H) 10/01/2021    Lab Results  Component Value Date/Time   HGBA1C 6.3 (A) 06/23/2021 09:10 AM   HGBA1C 6.9 (H) 12/11/2020 10:32 AM   HGBA1C 6.9 (H) 01/28/2020 12:23 PM   GFR 79.78 12/11/2020 10:32 AM   GFR 92.21 09/28/2018 11:03 AM   MICROALBUR 2.2 (H) 04/12/2017 10:28 AM    Last diabetic Eye exam: No results found for: HMDIABEYEEXA  Last diabetic Foot exam: No results found for: HMDIABFOOTEX   Lab Results  Component Value Date   CHOL 144 12/11/2020   HDL 44.70 12/11/2020   LDLCALC 84 12/11/2020   TRIG 76.0 12/11/2020   CHOLHDL 3 12/11/2020       Latest Ref Rng & Units 12/11/2020   10:32 AM 09/29/2019   10:31 AM 09/28/2018   11:03 AM  Hepatic Function  Total Protein 6.0 - 8.3 g/dL 6.9   7.1   6.9    Albumin 3.5 - 5.2 g/dL 4.3   4.0   4.6    AST 0 - 37 U/L 27   26   27     ALT 0 - 53 U/L 28   31   28     Alk Phosphatase 39 - 117 U/L 64   49   50    Total Bilirubin 0.2 - 1.2 mg/dL 0.9   0.7   0.7      Lab Results  Component Value Date/Time   TSH 2.17 12/11/2020 10:32 AM   TSH 2.30 09/28/2018 11:03 AM       Latest Ref Rng & Units 10/01/2021   11:37 PM 12/11/2020    10:32 AM 01/28/2020   12:23 PM  CBC  WBC 4.0 - 10.5 K/uL 8.8   5.1   8.9    Hemoglobin 13.0 - 17.0 g/dL 13.7   14.0   16.2    Hematocrit 39.0 - 52.0 % 40.9   40.8   47.7    Platelets 150 - 400 K/uL 173   185.0   193      No results found for: VD25OH  Clinical ASCVD: No  The ASCVD Risk score (Arnett DK, et al., 2019) failed to calculate for the following reasons:   The 2019 ASCVD risk score is only valid for ages 82 to 26       02/09/2022    7:15 AM 10/02/2021    1:46 PM 06/02/2021   10:35 AM  Depression screen PHQ 2/9  Decreased Interest 0 0 0  Down, Depressed, Hopeless 0 0 0  PHQ - 2 Score 0 0 0  Altered sleeping 0 2   Tired, decreased energy 0 0   Change in appetite 0 0   Feeling bad or failure about yourself  0 0   Trouble concentrating 0 0   Moving slowly or fidgety/restless 0 0   Suicidal thoughts 0 0   PHQ-9 Score 0 2   Difficult doing work/chores Not difficult at all        Social History   Tobacco Use  Smoking Status Never  Smokeless Tobacco Never   BP Readings from Last 3 Encounters:  02/09/22 120/68  01/22/22 130/68  10/07/21 128/70   Pulse Readings from Last 3 Encounters:  02/09/22 (!) 53  01/22/22 (!) 50  10/07/21 (!) 52   Wt Readings from Last 3 Encounters:  02/09/22 156 lb (70.8 kg)  01/22/22 157 lb (71.2 kg)  10/07/21 158 lb (71.7 kg)   BMI Readings from Last 3 Encounters:  02/09/22 24.80 kg/m  01/22/22 24.59 kg/m  10/07/21 24.75 kg/m    Assessment/Interventions: Review of patient past medical history, allergies, medications, health status, including review of consultants reports, laboratory and other test data, was performed as part of comprehensive evaluation and provision of chronic care management services.   SDOH:  (Social Determinants of Health) assessments and interventions performed: Yes   SDOH Screenings   Alcohol Screen: Not on file  Depression (PHQ2-9): Low Risk    PHQ-2 Score: 0  Financial Resource Strain: Low Risk     Difficulty of Paying Living Expenses: Not very hard  Food Insecurity: No Food Insecurity   Worried About Charity fundraiser in the Last Year: Never true   Ran Out of Food in the Last Year: Never true  Housing: Low Risk    Last Housing Risk Score:  0  Physical Activity: Sufficiently Active   Days of Exercise per Week: 5 days   Minutes of Exercise per Session: 30 min  Social Connections: Socially Integrated   Frequency of Communication with Friends and Family: Three times a week   Frequency of Social Gatherings with Friends and Family: Three times a week   Attends Religious Services: 1 to 4 times per year   Active Member of Clubs or Organizations: Yes   Attends Music therapist: More than 4 times per year   Marital Status: Married  Stress: No Stress Concern Present   Feeling of Stress : Not at all  Tobacco Use: Low Risk    Smoking Tobacco Use: Never   Smokeless Tobacco Use: Never   Passive Exposure: Not on file  Transportation Needs: No Transportation Needs   Lack of Transportation (Medical): No   Lack of Transportation (Non-Medical): No    CCM Care Plan  No Known Allergies  Medications Reviewed Today     Reviewed by Viona Gilmore, [facility] (Pharmacist) on 02/24/22 at 1521  Med List Status: <None>   Medication Order Taking? Sig Documenting Provider Last Dose Status Informant  aspirin 81 MG tablet [id number] Yes Take 81 mg by mouth daily. [provider] Taking Active Spouse/Significant Other  atorvastatin (LIPITOR) 10 MG tablet [id number] Yes Take 10 mg by mouth at bedtime. [provider] Taking Active Spouse/Significant Other  Boswellia-Glucosamine-Vit D (OSTEO BI-FLEX ONE PER DAY PO) [id number]  Take 1 tablet by mouth daily. [provider]  Active Spouse/Significant Other  diclofenac Sodium (VOLTAREN) 1 % GEL [id number]  Apply 1 application topically 4 (four) times daily as needed (pain). [provider]  Active Spouse/Significant  Other  levothyroxine (SYNTHROID) 88 MCG tablet [id number] Yes Take 88 mcg by mouth daily before breakfast. [provider] Taking Active Spouse/Significant Other  lisinopril (PRINIVIL,ZESTRIL) 10 MG tablet [id number] Yes Take 5 mg by mouth in the morning and at bedtime.  [provider] Taking Active Spouse/Significant Other  Magnesium 250 MG TABS [id number] Yes Take by mouth daily. [provider] Taking Active   metFORMIN (GLUCOPHAGE) 500 MG tablet [id number] Yes Take by mouth daily with breakfast. [provider] Taking Active   Multiple Vitamin (MULTIVITAMIN) tablet [id number]  Take 1 tablet by mouth daily. [provider]  Active Spouse/Significant Other  naproxen sodium (ANAPROX) 220 MG tablet [id number]  Take 220 mg by mouth daily as needed (pain).  [provider]  Active Spouse/Significant Other  Omega-3 1000 MG CAPS [id number]  Take 1,000 mg by mouth daily. [provider]  Active Spouse/Significant Other  omeprazole (PRILOSEC) 20 MG capsule [id number] Yes Take 20 mg by mouth in the morning and at bedtime. [provider] Taking Active Spouse/Significant Other  Probiotic Product (PROBIOTIC DAILY PO) [id number]  Take 1 capsule by mouth daily.  [provider]  Active Spouse/Significant Other  psyllium (METAMUCIL) 58.6 % powder [id number]  Take 1 packet by mouth daily as needed (constipation).  [provider]  Active Spouse/Significant Other  sildenafil (VIAGRA) 100 MG tablet [id number] Yes Take 100 mg by mouth as needed for erectile dysfunction.  [provider] Taking Active Spouse/Significant Other  tamsulosin (FLOMAX) 0.4 MG CAPS capsule [id number] Yes Take 0.4 mg by mouth at bedtime. [provider] Taking Active Spouse/Significant Other  vitamin C (ASCORBIC ACID) 500 MG tablet [id number]  Take 500 mg by mouth daily. [provider]  Active Spouse/Significant Other  Patient  Active Problem List   Diagnosis Date Noted   Mixed hyperlipidemia 11/06/2020   Essential hypertension, benign 07/04/2014   Hypothyroidism 07/04/2014   GERD (gastroesophageal reflux disease) 07/04/2014   Lumbar spondylosis 07/04/2014   Diabetes ([place]) 07/04/2014   Cholelithiases 07/04/2014   Herpes zoster 07/04/2014   History of renal calculi 07/04/2014   Diverticulitis of colon 07/04/2014   Hiatal hernia 07/04/2014    Immunization History  Administered Date(s) Administered   Fluad Quad(high Dose 65+) 06/27/2020, 06/23/2021   Influenza, High Dose Seasonal PF 07/31/2015, 07/04/2017   Influenza,inj,Quad PF,6+ Mos 07/04/2014, 08/05/2019   Influenza-Unspecified 08/21/2018   Moderna Sars-Covid-2 Vaccination 02/04/2020, 03/03/2020, 11/06/2020   PNEUMOCOCCAL CONJUGATE-20 06/10/2021   Pneumococcal Conjugate-13 07/04/2014   Zoster Recombinat (Shingrix) 04/03/2017, 07/04/2017    Conditions to be addressed/monitored:  Hypertension, Hyperlipidemia, Diabetes, GERD, Hypothyroidism, and BPH  Conditions addressed this visit: Hypertension, BPH  Care Plan : [place]  Updates made by Viona Gilmore, [place] since 02/24/2022 12:00 AM     Problem: Problem: Hypertension, Hyperlipidemia, Diabetes, GERD, Hypothyroidism, and BPH      Long-Range Goal: Patient-Specific Goal   Start Date: 08/31/2021  Expected End Date: 08/31/2022  Recent Progress: On track  Priority: High  Note:   Current Barriers:  Unable to independently monitor therapeutic efficacy  Pharmacist Clinical Goal(s):  Patient will achieve adherence to monitoring guidelines and medication adherence to achieve therapeutic efficacy through collaboration with PharmD and provider.   Interventions: 1:1 collaboration with [name], NP regarding development and update of comprehensive plan of care as evidenced by provider attestation and co-signature Inter-disciplinary care team collaboration (see longitudinal plan of  care) Comprehensive medication review performed; medication list updated in electronic medical record  Hypertension (BP goal <140/90) -Controlled -Current treatment: Lisinopril 10 mg 1/2 tablet twice daily - Appropriate, Effective, Safe, Accessible -Medications previously tried: unknown  -Current home readings: has an arm cuff that he pumps up himself (does not use often) -Current dietary habits: doesn't add much salt to food but eats out at [employer] for breakfast often; wife doesn't like to cook much anymore -Current exercise habits: active with chopping wood -Denies hypotensive/hypertensive symptoms -Educated on BP goals and benefits of medications for prevention of heart attack, stroke and kidney damage; Daily salt intake goal < 2300 mg; Importance of home blood pressure monitoring; Proper BP monitoring technique; -Counseled to monitor BP at home weekly, document, and provide log at future appointments -Counseled on diet and exercise extensively Recommended to continue current medication  Hyperlipidemia: (LDL goal < 100) -Controlled -Current treatment: Atorvastatin 10 mg 1 tablet at bedtime - Appropriate, Effective, Safe, Accessible Omega 3 fish oil 1000 mg 1 capsule daily - Appropriate, Effective, Safe, Accessible -Medications previously tried: none  -Current dietary patterns: eats out more often than he should -Current exercise habits: active around the house -Educated on Cholesterol goals;  Benefits of statin for ASCVD risk reduction; -Counseled on diet and exercise extensively Recommended to continue current medication Recommended stopping fish oil given risk vs benefit  Diabetes (A1c goal <7%) -Controlled -Current medications: Metformin 500 mg daily with breakfast - Appropriate, Effective, Safe, Accessible -Medications previously tried: none  -Current home glucose readings fasting glucose: hard to check blood sugars post prandial glucose: n/a -Denies  hypoglycemic/hyperglycemic symptoms -Current meal patterns:  breakfast: [employer] sausage, egg & biscuit  lunch: n/a  dinner: n/a snacks: n/a drinks: coffee in AM -Current exercise: active around the house -Educated on A1c and blood sugar goals; Benefits of routine  self-monitoring of blood sugar; Continuous glucose monitoring; Carbohydrate counting and/or plate method -Counseled to check feet daily and get yearly eye exams -Counseled on diet and exercise extensively Recommended to continue current medication Recommended for patient to inquire about CGM with the [place].  Hypothyroidism (Goal: TSH 0.35-4.5) -Controlled -Current treatment  Levothyroxine 88 mcg 1 tablet daily before breakfast - Appropriate, Effective, Safe, Accessible -Medications previously tried: none  -Recommended to continue current medication  Osteoarthritis (Goal: minimize pain) -Controlled -Current treatment  Osteo Biflex daily - Appropriate, Effective, Safe, Accessible Diclofenac gel 1% as needed - Appropriate, Effective, Safe, Accessible Naproxen 220 mg daily as needed - Appropriate, Effective, Safe, Accessible -Medications previously tried: none  -Recommended limiting use of NSAIDs.  BPH (Goal: minimize symptoms) -Controlled -Current treatment  Tamsulosin 0.4 mg 1 capsule daily - Appropriate, Effective, Safe, Accessible -Medications previously tried: none  -Recommended to continue current medication  GERD/hiatal hernia (Goal: minimize symptoms) -Controlled -Current treatment  Omeprazole 20 mg 1 capsule twice daily - only taking in the morning - Appropriate, Effective, Safe, Accessible -Medications previously tried: none  -Recommended to continue current medication   Health Maintenance -Vaccine gaps: tetanus, pneumonia, COVID booster -Current therapy:  Aspirin 81 mg 1 tablet daily Multivitamin 1 tablet daily Probiotic 1 tablet daily Metamucil powder as needed - every 3 days Sildenafil 100 mg as  needed Vitamin C 500 mg 1 tablet daily Magnesium 250 mg 1 tablet daily -Educated on Cost vs benefit of each product must be carefully weighed by individual consumer -Patient is satisfied with current therapy and denies issues -Recommended to continue current medication  Patient Goals/Self-Care Activities Patient will:  - take medications as prescribed as evidenced by patient report and record review check blood pressure weekly, document, and provide at future appointments  Follow Up Plan: Telephone follow up appointment with care management team member scheduled for: 6 months       Medication Assistance: None required.  Patient affirms current coverage meets needs.  Compliance/Adherence/Medication fill history: Care Gaps: Eye exam, tetanus  Last BP - 120/68 on 02/09/2022 Last A1C - 6.3 on 06/23/2021  [facility]: Unable to access records from the [place] Atorvastatin 10 mg - filled at [place] Lisinopril 10 mg - filled at [facility] Metformin 500 mg - filled at [facility]  Patient's preferred pharmacy is:  [facility] [address] [place] - [place] [address] [place] [place] [address] Phone: [phone] Fax: [facility] [address] [place] - [place] [address] Phone: [phone] Fax: [phone]  [facility] - [place], [place] - [address] [place] [address] Phone: [phone] Fax: [phone]   Uses pill box? Yes Pt endorses 90% compliance  We discussed: Benefits of medication synchronization, packaging and delivery as well as enhanced pharmacist oversight with [employer]. Patient decided to: Continue current medication management strategy  Care Plan and Follow Up Patient Decision:  Patient agrees to Care Plan and Follow-up.  Plan: Telephone follow up appointment with care management team member scheduled for:  6 months  [name], PharmD,  [place] [facility] [place] [phone]

## 2022-03-02 DIAGNOSIS — R3912 Poor urinary stream: Secondary | ICD-10-CM | POA: Diagnosis not present

## 2022-03-02 DIAGNOSIS — N5201 Erectile dysfunction due to arterial insufficiency: Secondary | ICD-10-CM | POA: Diagnosis not present

## 2022-03-03 DIAGNOSIS — E1169 Type 2 diabetes mellitus with other specified complication: Secondary | ICD-10-CM | POA: Diagnosis not present

## 2022-03-03 DIAGNOSIS — E785 Hyperlipidemia, unspecified: Secondary | ICD-10-CM

## 2022-03-03 DIAGNOSIS — Z7984 Long term (current) use of oral hypoglycemic drugs: Secondary | ICD-10-CM | POA: Diagnosis not present

## 2022-03-03 DIAGNOSIS — I10 Essential (primary) hypertension: Secondary | ICD-10-CM | POA: Diagnosis not present

## 2022-03-03 DIAGNOSIS — E039 Hypothyroidism, unspecified: Secondary | ICD-10-CM | POA: Diagnosis not present

## 2022-03-03 DIAGNOSIS — N400 Enlarged prostate without lower urinary tract symptoms: Secondary | ICD-10-CM

## 2022-03-05 ENCOUNTER — Encounter: Payer: Self-pay | Admitting: Adult Health

## 2022-03-19 ENCOUNTER — Other Ambulatory Visit: Payer: Self-pay

## 2022-03-19 ENCOUNTER — Emergency Department (HOSPITAL_BASED_OUTPATIENT_CLINIC_OR_DEPARTMENT_OTHER)
Admission: EM | Admit: 2022-03-19 | Discharge: 2022-03-19 | Disposition: A | Discharge status: Home / Self Care | Payer: Medicare HMO | Attending: Emergency Medicine | Admitting: Emergency Medicine

## 2022-03-19 ENCOUNTER — Encounter (HOSPITAL_BASED_OUTPATIENT_CLINIC_OR_DEPARTMENT_OTHER): Payer: Self-pay

## 2022-03-19 DIAGNOSIS — S46912A Strain of unspecified muscle, fascia and tendon at shoulder and upper arm level, left arm, initial encounter: Secondary | ICD-10-CM

## 2022-03-19 DIAGNOSIS — X503XXA Overexertion from repetitive movements, initial encounter: Secondary | ICD-10-CM | POA: Insufficient documentation

## 2022-03-19 DIAGNOSIS — E039 Hypothyroidism, unspecified: Secondary | ICD-10-CM | POA: Insufficient documentation

## 2022-03-19 DIAGNOSIS — S56912A Strain of unspecified muscles, fascia and tendons at forearm level, left arm, initial encounter: Secondary | ICD-10-CM

## 2022-03-19 DIAGNOSIS — S53402A Unspecified sprain of left elbow, initial encounter: Secondary | ICD-10-CM | POA: Diagnosis not present

## 2022-03-19 DIAGNOSIS — S59902A Unspecified injury of left elbow, initial encounter: Secondary | ICD-10-CM | POA: Diagnosis present

## 2022-03-19 MED ORDER — MELOXICAM 7.5 MG PO TABS: ORAL_TABLET | ORAL | 0 refills | Status: DC

## 2022-03-19 MED ORDER — NAPROXEN 250 MG PO TABS
375.0000 mg | ORAL_TABLET | Freq: Once | ORAL | Status: AC
  Administered 2022-03-19: 375 mg via ORAL
  Filled 2022-03-19: qty 2

## 2022-03-19 NOTE — ED Provider Notes (Signed)
[facility] Provider Note: Todd Spurling, MD, FACEP  CSN: [id number] MRN: [id number] ARRIVAL: 03/19/22 at [place]: [place]  Arm Pain   HISTORY OF PRESENT ILLNESS  03/19/22 3:00 AM Todd Watson is a 81 y.o. male who is pleasant splitting firewood for the past several weeks.  He is here with 2 days of pain in his left arm.  Specifically the pain is around his left elbow.  He rates it as a 7 out of 10, aching in nature.  It is worse with palpation but not so much with movement.  He has had relief with Aleve.  He has also had relief soaking it in a hot tub of water.  He is having no numbness or functional deficit distal to his elbow.   Past Medical History:  Diagnosis Date   Arthritis    GERD (gastroesophageal reflux disease)    Herpes zoster    Hiatal hernia    Hypothyroidism    Osteoarthritis of lumbar spine     Past Surgical History:  Procedure Laterality Date   APPENDECTOMY  2000   INGUINAL HERNIA REPAIR Bilateral    LUMBAR LAMINECTOMY/DECOMPRESSION MICRODISCECTOMY Right 01/28/2020   Procedure: Microdiscectomy - right - Lumbar four-Lumbar five;  Surgeon: Todd Kos, MD;  Location: [place];  Service: Neurosurgery;  Laterality: Right;   SPINE SURGERY     spurs    Family History  Problem Relation Age of Onset   Heart disease Father        Valve disease   Kidney failure Mother     Social History   Tobacco Use   Smoking status: Never   Smokeless tobacco: Never  Substance Use Topics   Alcohol use: No   Drug use: No    Prior to Admission medications   Medication Sig Start Date End Date Taking? Authorizing Provider  meloxicam (MOBIC) 7.5 MG tablet Take 1 tablet daily as needed for elbow pain. 03/19/22  Yes Todd Dollins, MD  atorvastatin (LIPITOR) 10 MG tablet Take 10 mg by mouth at bedtime.    [provider]  Boswellia-Glucosamine-Vit D (OSTEO BI-FLEX ONE PER DAY PO) Take 1 tablet by mouth daily.    [provider]   diclofenac Sodium (VOLTAREN) 1 % GEL Apply 1 application topically 4 (four) times daily as needed (pain).    [provider]  levothyroxine (SYNTHROID) 88 MCG tablet Take 88 mcg by mouth daily before breakfast.    [provider]  lisinopril (PRINIVIL,ZESTRIL) 10 MG tablet Take 5 mg by mouth in the morning and at bedtime.     [provider]  Magnesium 250 MG TABS Take by mouth daily.    [provider]  metFORMIN (GLUCOPHAGE) 500 MG tablet Take by mouth daily with breakfast.    [provider]  Multiple Vitamin (MULTIVITAMIN) tablet Take 1 tablet by mouth daily.    [provider]  Omega-3 1000 MG CAPS Take 1,000 mg by mouth daily.    [provider]  omeprazole (PRILOSEC) 20 MG capsule Take 20 mg by mouth in the morning and at bedtime.    [provider]  Probiotic Product (PROBIOTIC DAILY PO) Take 1 capsule by mouth daily.     [provider]  psyllium (METAMUCIL) 58.6 % powder Take 1 packet by mouth daily as needed (constipation).     [provider]  sildenafil (VIAGRA) 100 MG tablet Take 100 mg by mouth as needed for erectile dysfunction.  [provider]  tamsulosin (FLOMAX) 0.4 MG CAPS capsule Take 0.4 mg by mouth at bedtime.    [provider]  vitamin C (ASCORBIC ACID) 500 MG tablet Take 500 mg by mouth daily.    [provider]    Allergies Patient has no known allergies.   REVIEW OF SYSTEMS  Negative except as noted here or in the History of Present Illness.   PHYSICAL EXAMINATION  Initial Vital Signs Blood pressure (!) 148/79, pulse (!) 47, temperature 97.6 F (36.4 C), resp. rate 16, height '5\' 7"'$  (1.702 m), weight 70.3 kg, SpO2 99 %.  Examination General: Well-developed, well-nourished male in no acute distress; appearance consistent with age of record HENT: normocephalic; atraumatic Eyes: Normal appearing Neck: supple Heart: regular rate and  rhythm Lungs: clear to auscultation bilaterally Abdomen: soft; nondistended; nontender; bowel sounds present Extremities: No deformity; tenderness of soft tissues just proximal and distal to left elbow without tenderness of the epicondyles themselves, left upper extremity distally neurovascularly intact Neurologic: Awake, alert and oriented; motor function intact in all extremities and symmetric; no facial droop Skin: Warm and dry Psychiatric: Normal mood and affect   RESULTS  Summary of this visit's results, reviewed and interpreted by myself:   EKG Interpretation  Date/Time:    Ventricular Rate:    PR Interval:    QRS Duration:   QT Interval:    QTC Calculation:   R Axis:     Text Interpretation:         Laboratory Studies: No results found for this or any previous visit (from the past 24 hour(s)). Imaging Studies: No results found.  ED COURSE and MDM  Nursing notes, initial and subsequent vitals signs, including pulse oximetry, reviewed and interpreted by myself.  Vitals:   03/19/22 0054 03/19/22 0100  BP: (!) 148/79   Pulse: (!) 47   Resp: 16   Temp: 97.6 F (36.4 C)   SpO2: 99%   Weight:  70.3 kg  Height:  '5\' 7"'$  (1.702 m)   Medications  naproxen (NAPROSYN) tablet 375 mg (has no administration in time range)   We will place the patient on low-dose Mobic as this may be gentler on his stomach then naproxen.  His creatinine as of 6 months ago was less than 1.   PROCEDURES  Procedures   ED DIAGNOSES     ICD-10-CM   1. Elbow strain, left, initial encounter  C78.938B          Todd Rosser, MD 03/19/22 [phone]

## 2022-03-19 NOTE — ED Triage Notes (Signed)
Pt states he has been splitting wood x 2 days and is c/o muscular left arm pain.   Has taken 2 aleve and has applied horse linament to arm with some relief

## 2022-03-22 DIAGNOSIS — M25512 Pain in left shoulder: Secondary | ICD-10-CM | POA: Diagnosis not present

## 2022-04-05 DIAGNOSIS — M25512 Pain in left shoulder: Secondary | ICD-10-CM | POA: Diagnosis not present

## 2022-04-15 DIAGNOSIS — H6123 Impacted cerumen, bilateral: Secondary | ICD-10-CM | POA: Diagnosis not present

## 2022-04-15 DIAGNOSIS — Z6824 Body mass index (BMI) 24.0-24.9, adult: Secondary | ICD-10-CM | POA: Diagnosis not present

## 2022-04-21 ENCOUNTER — Ambulatory Visit: Payer: Medicare HMO | Admitting: Orthopaedic Surgery

## 2022-04-21 ENCOUNTER — Encounter: Payer: Self-pay | Admitting: Orthopaedic Surgery

## 2022-04-21 DIAGNOSIS — M542 Cervicalgia: Secondary | ICD-10-CM | POA: Diagnosis not present

## 2022-04-21 MED ORDER — GABAPENTIN 100 MG PO CAPS: 100.0000 mg | ORAL_CAPSULE | Freq: Every day | ORAL | 1 refills | Status: DC

## 2022-04-21 NOTE — Progress Notes (Signed)
The patient is an 81 year old gentleman who comes in with left-sided neck pain with numbness and tingling going down with his left arm and hand.  He originally had some issues with his left shoulder but that is resolved.  He has had a steroid injection in the left shoulder before and x-rays.  He says he was stacking wood recently and was having pain.  He does state that having a warm bath helps him quite a bit.  He was prescribed meloxicam by an ER physician.  He does a lot of work with his hands.  He has had 2 lumbar spine surgeries by Dr. Saintclair Halsted in town.  He denies any back issues.  He has excellent range of motion of his left shoulder.  He does have left-sided neck pain and a positive Spurling's on the left side with good range of motion of his neck.  He has some slight weak grip strength on the left side when comparing left and right side but this is slight.  He has some subjective numbness in his left hand.  I would like to start him on Neurontin 100 mg with a low-dose to take at bedtime.  We will see him back in 4 weeks to see how he is doing overall.  I would like an AP and lateral of the cervical spine at that visit if he is having issues still.  All question concerns were answered and addressed.  He agrees with this treatment plan.

## 2022-05-06 ENCOUNTER — Telehealth: Payer: Self-pay | Admitting: Pharmacist

## 2022-05-06 NOTE — Chronic Care Management (AMB) (Signed)
Chronic Care Management Pharmacy Assistant   Name: Todd Watson  MRN: [id number] DOB: 1940/10/08  Reason for Encounter: Disease State / Hypertension Assessment Call   Conditions to be addressed/monitored: HTN  Recent office visits:  None  Recent consult visits:  04/21/2022 Todd Rosenthal MD (ortho) - Patient was seen for cervicalgia. Started Gabapentin 100 mg daily. Follow up in 4 weeks.  04/15/2022 Todd Dies NP ([facility]) - Patient was seen for impacted Cerumen, bilateral . No medication changes.   04/05/2022 Todd Gaw MD with [facility]. Patient was seen for pain of left shoulder joint. No other chart notes.   03/22/2022 Todd Gaw MD (sport medicine) Patient was seen for pain of left shoulder joint. No other chart notes.   03/02/2022 Todd Watson (urology) - Patient was seen for poof urinary stream and erectile dysfunction due to arterial insufficiency. No other chart notes.   Hospital visits:  Patient was seen at [facility] ED on 03/19/2022 (1 hour) due to Elbow strain, left.   New?Medications Started at [facility] Discharge:?? Meloxicam 7.5 mg take 1 tablet daily Medication Changes at Hospital Discharge: No medication changes Medications Discontinued at Hospital Discharge: Naproxen 220 mg Medications that remain the same after Hospital Discharge:??  -All other medications will remain the same.    Medications: Outpatient Encounter Medications as of 05/06/2022  Medication Sig   atorvastatin (LIPITOR) 10 MG tablet Take 10 mg by mouth at bedtime.   Boswellia-Glucosamine-Vit D (OSTEO BI-FLEX ONE PER DAY PO) Take 1 tablet by mouth daily.   diclofenac Sodium (VOLTAREN) 1 % GEL Apply 1 application topically 4 (four) times daily as needed (pain).   gabapentin (NEURONTIN) 100 MG capsule Take 1 capsule (100 mg total) by mouth at bedtime.   levothyroxine (SYNTHROID) 88 MCG tablet Take 88 mcg by mouth daily before breakfast.    lisinopril (PRINIVIL,ZESTRIL) 10 MG tablet Take 5 mg by mouth in the morning and at bedtime.    Magnesium 250 MG TABS Take by mouth daily.   meloxicam (MOBIC) 7.5 MG tablet Take 1 tablet daily as needed for elbow pain.   metFORMIN (GLUCOPHAGE) 500 MG tablet Take by mouth daily with breakfast.   Multiple Vitamin (MULTIVITAMIN) tablet Take 1 tablet by mouth daily.   Omega-3 1000 MG CAPS Take 1,000 mg by mouth daily.   omeprazole (PRILOSEC) 20 MG capsule Take 20 mg by mouth in the morning and at bedtime.   Probiotic Product (PROBIOTIC DAILY PO) Take 1 capsule by mouth daily.    psyllium (METAMUCIL) 58.6 % powder Take 1 packet by mouth daily as needed (constipation).    sildenafil (VIAGRA) 100 MG tablet Take 100 mg by mouth as needed for erectile dysfunction.    tamsulosin (FLOMAX) 0.4 MG CAPS capsule Take 0.4 mg by mouth at bedtime.   vitamin C (ASCORBIC ACID) 500 MG tablet Take 500 mg by mouth daily.   No facility-administered encounter medications on file as of 05/06/2022.  Fill History: GABAPENTIN '100MG'$     CAP 04/21/2022 30   MELOXICAM 7.'5MG'$      TAB 03/19/2022 30   tadalafil 20 mg tablet 02/26/2022 30   TRAMADOL HCL '50MG'$  TAB 04/19/2022 5   Reviewed chart prior to disease state call. Spoke with patient regarding BP  Recent Office Vitals: BP Readings from Last 3 Encounters:  03/19/22 (!) 157/74  02/09/22 120/68  01/22/22 130/68   Pulse Readings from Last 3 Encounters:  03/19/22 (!) 44  02/09/22 (!) 53  01/22/22 (!)  50    Wt Readings from Last 3 Encounters:  03/19/22 155 lb (70.3 kg)  02/09/22 156 lb (70.8 kg)  01/22/22 157 lb (71.2 kg)     Kidney Function Lab Results  Component Value Date/Time   CREATININE 0.87 10/01/2021 11:37 PM   CREATININE 0.90 12/11/2020 10:32 AM   GFR 79.78 12/11/2020 10:32 AM   GFRNONAA >60 10/01/2021 11:37 PM   GFRAA >60 01/28/2020 12:23 PM       Latest Ref Rng & Units 10/01/2021   11:37 PM 12/11/2020   10:32 AM 01/28/2020   12:23 PM  BMP   Glucose 70 - 99 mg/dL 125  124  147   BUN 8 - 23 mg/dL '20  18  18   '$ Creatinine 0.61 - 1.24 mg/dL 0.87  0.90  0.81   Sodium 135 - 145 mmol/L 139  140  138   Potassium 3.5 - 5.1 mmol/L 4.2  4.8  4.1   Chloride 98 - 111 mmol/L 103  103  100   CO2 22 - 32 mmol/L '27  30  24   '$ Calcium 8.9 - 10.3 mg/dL 9.2  9.4  9.2     Current antihypertensive regimen:  Lisinopril 10 mg twice daily  How often are you checking your Blood Pressure? He checks his blood pressure about once per month  Current home BP readings: Patient states his last reading was 147/70, [place] for one more month, when he gets home he will check his blood pressures more often.   What recent interventions/DTPs have been made by any provider to improve Blood Pressure control since last CPP Visit: No recent interventions  Any recent hospitalizations or ED visits since last visit with CPP? Patient was seen at [facility] ED on 03/19/2022 (1 hour) due to Elbow strain, left.   What diet changes have been made to improve Blood Pressure Control?  Patient tries to not eat sugars or foods with sugars Breakfast - Egg and cheese biscuit or cereal Lunch - sandwich if he is [place] Dinner - generally eats vegetables and a meat  What exercise is being done to improve your Blood Pressure Control?  Patient is currently staying at a campground in [place], he states he walks the [place] daily.   Adherence Review: Is the patient currently on ACE/ARB medication? Yes Does the patient have >5 day gap between last estimated fill dates? No fill history   Care Gaps: AWV - message sent to Todd Watson Last BP - 120/68 on 02/09/2022 Last A1C - 6.3 on 06/23/2021 Flu - due  Star Rating Drugs: Atorvastatin 10 mg - no fill history in chart Lisinopril 10 mg - no fill history in chart Metformin 500 mg - no fill history in chart  [place] Pharmacist  Assistant [phone]

## 2022-05-24 ENCOUNTER — Encounter: Payer: Self-pay | Admitting: Orthopaedic Surgery

## 2022-05-24 ENCOUNTER — Ambulatory Visit (INDEPENDENT_AMBULATORY_CARE_PROVIDER_SITE_OTHER): Payer: Medicare HMO

## 2022-05-24 ENCOUNTER — Ambulatory Visit: Payer: Medicare HMO | Admitting: Orthopaedic Surgery

## 2022-05-24 DIAGNOSIS — M542 Cervicalgia: Secondary | ICD-10-CM

## 2022-05-24 NOTE — Progress Notes (Signed)
The patient is an 81 year old gentleman who still comes in with complaints of left shoulder pain but also numbness and tingling going down his left arm into his left hand.  He did have an injection in his left shoulder when he was [place] the mountains back in [place].  He says that has not helped.  Previous x-rays of the left shoulder from a year or 2 ago showed arthritic changes in the shoulder joint.  X-rays today of the cervical spine showing significant generative changes at the lower cervical spine.  He has had 2 lumbar surgeries by Dr. Saintclair Halsted in town.  We started him on Neurontin 100 mg at night and he said he really cannot tell a difference.  I offered him physical therapy but he really wants to see what else that he can potentially do for his left upper extremity.  On exam his left shoulder does have some grinding at the glenohumeral joint and pain throughout the arc of motion.  He also has subjective numbness going into his left hand and thumb as well.  There is some weakness of the triceps.  I would like to send him to Dr. Ernestina Patches for left upper extremity nerve conduction studies to help determine the source of his radicular symptoms.  He agrees with this plan.

## 2022-05-25 ENCOUNTER — Other Ambulatory Visit: Payer: Self-pay

## 2022-05-25 DIAGNOSIS — M542 Cervicalgia: Secondary | ICD-10-CM

## 2022-05-27 ENCOUNTER — Telehealth: Payer: Self-pay | Admitting: Adult Health

## 2022-05-27 NOTE — Telephone Encounter (Signed)
Left message for patient to call back and schedule Medicare Annual Wellness Visit (AWV) either virtually or in office. Left  my Herbie Drape number [phone]   Last AWV ;06/02/21  please schedule at anytime with [facility] Nurse Health Advisor 1 or 2

## 2022-06-04 ENCOUNTER — Telehealth: Payer: Self-pay

## 2022-06-04 NOTE — Telephone Encounter (Signed)
---  Caller states that he just took his blood pressure and it was 106/61 and 112/62. His left arm is tingling as well that has been present for about a month. Saw MD for this, placed on meloxicam. Pain returned and he was placed on gabapentin  06/03/2022 3:48:03 PM SEE PCP WITHIN 3 DAYS Yes Joya Gaskins, RN, Todd Watson  06/04/22 at 1208 - LVM instructions for pt to call to schedule appt with PCP. Pt can also schedule appt with orthopedic which is who put him on the medication  &is following the arm tingling.

## 2022-06-08 ENCOUNTER — Ambulatory Visit (INDEPENDENT_AMBULATORY_CARE_PROVIDER_SITE_OTHER): Payer: Medicare HMO

## 2022-06-08 ENCOUNTER — Telehealth: Payer: Self-pay | Admitting: Physical Medicine and Rehabilitation

## 2022-06-08 VITALS — BP 110/58 | HR 59 | Temp 97.7°F | Ht 66.5 in | Wt 160.4 lb

## 2022-06-08 DIAGNOSIS — Z0000 Encounter for general adult medical examination without abnormal findings: Secondary | ICD-10-CM | POA: Diagnosis not present

## 2022-06-08 NOTE — Telephone Encounter (Signed)
Patient called needing to Cx his appointment at this time.   Ph# is [phone]

## 2022-06-08 NOTE — Progress Notes (Signed)
Subjective:   KEYLER HOGE is a 81 y.o. male who presents for Medicare Annual/Subsequent preventive examination.  Review of Systems     Cardiac Risk Factors include: advanced age (>3mn, >>74women);diabetes mellitus;dyslipidemia;hypertension;male gender     Objective:    Today's Vitals   06/08/22 0950  BP: (!) 110/58  Pulse: (!) 59  Temp: 97.7 F (36.5 C)  TempSrc: Oral  SpO2: 96%  Weight: 160 lb 6.4 oz (72.8 kg)  Height: 5' 6.5" (1.689 m)   Body mass index is 25.5 kg/m.     06/08/2022   10:10 AM 10/01/2021    7:54 PM 06/02/2021   10:37 AM 10/12/2019   12:28 PM 09/29/2019    9:27 AM 09/04/2018    8:41 AM 08/09/2017    8:33 AM  Advanced Directives  Does Patient Have a Medical Advance Directive? No No No No No No No  Would patient like information on creating a medical advance directive? No - Patient declined  No - Patient declined No - Patient declined Yes (ED - Information included in AVS)  Yes (MAU/Ambulatory/Procedural Areas - Information given)    Current Medications (verified) Outpatient Encounter Medications as of 06/08/2022  Medication Sig   atorvastatin (LIPITOR) 10 MG tablet Take 10 mg by mouth at bedtime.   Boswellia-Glucosamine-Vit D (OSTEO BI-FLEX ONE PER DAY PO) Take 1 tablet by mouth daily.   levothyroxine (SYNTHROID) 88 MCG tablet Take 88 mcg by mouth daily before breakfast.   lisinopril (PRINIVIL,ZESTRIL) 10 MG tablet Take 5 mg by mouth in the morning and at bedtime.    Magnesium 250 MG TABS Take by mouth daily.   meloxicam (MOBIC) 7.5 MG tablet Take 1 tablet daily as needed for elbow pain.   metFORMIN (GLUCOPHAGE) 500 MG tablet Take by mouth daily with breakfast.   Multiple Vitamin (MULTIVITAMIN) tablet Take 1 tablet by mouth daily.   Omega-3 1000 MG CAPS Take 1,000 mg by mouth daily.   omeprazole (PRILOSEC) 20 MG capsule Take 20 mg by mouth in the morning and at bedtime.   Probiotic Product (PROBIOTIC DAILY PO) Take 1 capsule by mouth daily.     tamsulosin (FLOMAX) 0.4 MG CAPS capsule Take 0.4 mg by mouth at bedtime.   vitamin C (ASCORBIC ACID) 500 MG tablet Take 500 mg by mouth daily.   diclofenac Sodium (VOLTAREN) 1 % GEL Apply 1 application topically 4 (four) times daily as needed (pain). (Patient not taking: Reported on 06/08/2022)   gabapentin (NEURONTIN) 100 MG capsule Take 1 capsule (100 mg total) by mouth at bedtime. (Patient not taking: Reported on 06/08/2022)   psyllium (METAMUCIL) 58.6 % powder Take 1 packet by mouth daily as needed (constipation).  (Patient not taking: Reported on 06/08/2022)   sildenafil (VIAGRA) 100 MG tablet Take 100 mg by mouth as needed for erectile dysfunction.  (Patient not taking: Reported on 06/08/2022)   No facility-administered encounter medications on file as of 06/08/2022.    Allergies (verified) Patient has no known allergies.   History: Past Medical History:  Diagnosis Date   Arthritis    GERD (gastroesophageal reflux disease)    Herpes zoster    Hiatal hernia    Hypothyroidism    Osteoarthritis of lumbar spine    Past Surgical History:  Procedure Laterality Date   APPENDECTOMY  2000   INGUINAL HERNIA REPAIR Bilateral    LUMBAR LAMINECTOMY/DECOMPRESSION MICRODISCECTOMY Right 01/28/2020   Procedure: Microdiscectomy - right - Lumbar four-Lumbar five;  Surgeon: CKary Kos MD;  Location:  [facility];  Service: Neurosurgery;  Laterality: Right;   SPINE SURGERY     spurs   Family History  Problem Relation Age of Onset   Heart disease Father        Valve disease   Kidney failure Mother    Social History   Socioeconomic History   Marital status: Married    Spouse name: Not on file   Number of children: 3   Years of education: 10   Highest education level: GED or equivalent  Occupational History   Occupation: retired  Tobacco Use   Smoking status: Never   Smokeless tobacco: Never  Vaping Use   Vaping Use: Never used  Substance and Sexual Activity   Alcohol use: No   Drug use: No    Sexual activity: Yes  Other Topics Concern   Not on file  Social History Narrative   Lives with wife.    3 children     6 grandchildren & 5 great grandchildren   Likes to fish    Social Determinants of Health   Financial Resource Strain: Low Risk  (06/08/2022)   Overall Financial Resource Strain (CARDIA)    Difficulty of Paying Living Expenses: Not hard at all  Food Insecurity: No Food Insecurity (06/08/2022)   Hunger Vital Sign    Worried About Running Out of Food in the Last Year: Never true    Ran Out of Food in the Last Year: Never true  Transportation Needs: No Transportation Needs (06/08/2022)   PRAPARE - Hydrologist (Medical): No    Lack of Transportation (Non-Medical): No  Physical Activity: Inactive (06/08/2022)   Exercise Vital Sign    Days of Exercise per Week: 0 days    Minutes of Exercise per Session: 0 min  Stress: No Stress Concern Present (06/08/2022)   [place]    Feeling of Stress : Not at all  Social Connections: [place] (06/02/2021)   Social Connection and Isolation Panel [NHANES]    Frequency of Communication with Friends and Family: Three times a week    Frequency of Social Gatherings with Friends and Family: Three times a week    Attends Religious Services: 1 to 4 times per year    Active Member of Clubs or Organizations: Yes    Attends Music therapist: More than 4 times per year    Marital Status: Married    Tobacco Counseling Counseling given: Not Answered   Clinical Intake:  Pre-visit preparation completed: Yes  Pain : No/denies pain     Nutritional Status: BMI 25 -29 Overweight Nutritional Risks: None Diabetes: Yes  How often do you need to have someone help you when you read instructions, pamphlets, or other written materials from your doctor or pharmacy?: 1 - Never What is the last grade level you completed in school?:  10th grade GED  Diabetic? Yes Nutrition Risk Assessment:  Has the patient had any N/V/D within the last 2 months?  No  Does the patient have any non-healing wounds?  No  Has the patient had any unintentional weight loss or weight gain?  No   Diabetes:  Is the patient diabetic?  Yes  If diabetic, was a CBG obtained today?  No  Did the patient bring in their glucometer from home?  No  How often do you monitor your CBG's? Does not.   Financial Strains and Diabetes Management:  Are you having any  financial strains with the device, your supplies or your medication? No .  Does the patient want to be seen by Chronic Care Management for management of their diabetes?  No  Would the patient like to be referred to a Nutritionist or for Diabetic Management?  No   Diabetic Exams:  Diabetic Eye Exam: Completed 02/16/2022 Diabetic Foot Exam: Completed 02/09/2022   Interpreter Needed?: No  Information entered by :: NAllen LPN   Activities of Daily Living    06/08/2022   10:13 AM 02/09/2022    7:16 AM  In your present state of health, do you have any difficulty performing the following activities:  Hearing? 1 0  Comment has hearing aids but does not use them   Vision? 0 0  Difficulty concentrating or making decisions? 0 0  Walking or climbing stairs? 0 0  Dressing or bathing? 0 0  Doing errands, shopping? 0 0  Preparing Food and eating ? N   Using the Toilet? N   In the past six months, have you accidently leaked urine? N   Do you have problems with loss of bowel control? N   Managing your Medications? N   Managing your Finances? N   Housekeeping or managing your Housekeeping? N     Patient Care Team: [name], NP as PCP - General (Family Medicine) Kary Kos, MD as Consulting Physician (Neurosurgery) Viona Gilmore, [facility] as Pharmacist (Pharmacist)  Indicate any recent Medical Services you may have received from other than [facility] providers in the past year (date may be  approximate).     Assessment:   This is a routine wellness examination for [place].  Hearing/Vision screen Vision Screening - Comments:: [facility]  Dietary issues and exercise activities discussed: Current Exercise Habits: The patient does not participate in regular exercise at present   Goals Addressed             This Visit's Progress    Patient Stated       06/08/2022, no goals       Depression Screen    06/08/2022   10:13 AM 02/09/2022    7:15 AM 10/02/2021    1:46 PM 06/02/2021   10:35 AM 09/04/2018    8:47 AM 08/09/2017    8:39 AM 04/12/2017    9:38 AM  PHQ 2/9 Scores  PHQ - 2 Score 0 0 0 0 0 0 0  PHQ- 9 Score  0 2        Fall Risk    06/08/2022   10:13 AM 02/09/2022    7:16 AM 06/02/2021   10:38 AM 10/12/2019   12:35 PM 09/04/2018    8:47 AM  Fall Risk   Falls in the past year? 0 0 0 0 0  Number falls in past yr: 0 0 0    Injury with Fall? 0 0 0    Risk for fall due to : Medication side effect  No Fall Risks Medication side effect   Follow up Falls prevention discussed;Education provided;Falls evaluation completed  Falls evaluation completed Falls evaluation completed;Education provided;Falls prevention discussed     FALL RISK PREVENTION PERTAINING TO THE HOME:  Any stairs in or around the home? Yes  If so, are there any without handrails? No  Home free of loose throw rugs in walkways, pet beds, electrical cords, etc? Yes  Adequate lighting in your home to reduce risk of falls? Yes   ASSISTIVE DEVICES UTILIZED TO PREVENT FALLS:  Life alert?  No  Use of a cane, walker or w/c? No  Grab bars in the bathroom? Yes  Shower chair or bench in shower? Yes  Elevated toilet seat or a handicapped toilet? Yes   TIMED UP AND GO:  Was the test performed? Yes .  Length of time to ambulate 10 feet: 5 sec.   Gait slow and steady without use of assistive device  Cognitive Function:        06/08/2022   10:16 AM  6CIT Screen  What Year? 0 points  What month?  0 points  What time? 0 points  Count back from 20 4 points  Months in reverse 4 points  Repeat phrase 4 points  Total Score 12 points    Immunizations Immunization History  Administered Date(s) Administered   Fluad Quad(high Dose 65+) 06/27/2020, 06/23/2021   Influenza, High Dose Seasonal PF 07/31/2015, 07/04/2017   Influenza,inj,Quad PF,6+ Mos 07/04/2014, 08/05/2019   Influenza-Unspecified 08/21/2018   Moderna Covid-19 Vaccine Bivalent Booster 7yr & up 09/14/2021   Moderna Sars-Covid-2 Vaccination 02/04/2020, 03/03/2020, 11/06/2020   PNEUMOCOCCAL CONJUGATE-20 06/10/2021   Pneumococcal Conjugate-13 07/04/2014   Zoster Recombinat (Shingrix) 04/03/2017, 07/04/2017    TDAP status: Up to date  Flu Vaccine status: Due, Education has been provided regarding the importance of this vaccine. Advised may receive this vaccine at local pharmacy or Health Dept. Aware to provide a copy of the vaccination record if obtained from local pharmacy or Health Dept. Verbalized acceptance and understanding.  Pneumococcal vaccine status: Up to date  Covid-19 vaccine status: Completed vaccines  Qualifies for Shingles Vaccine? Yes   Zostavax completed Yes   Shingrix Completed?: Yes  Screening Tests Health Maintenance  Topic Date Due   COVID-19 Vaccine (7 - Moderna risk series) 11/09/2021   INFLUENZA VACCINE  05/04/2022   TETANUS/TDAP  07/04/2022   HEMOGLOBIN A1C  07/15/2022   FOOT EXAM  02/10/2023   OPHTHALMOLOGY EXAM  02/17/2023   Pneumonia Vaccine 81 Years old  Completed   Zoster Vaccines- Shingrix  Completed   HPV VACCINES  Aged Out    Health Maintenance  Health Maintenance Due  Topic Date Due   COVID-19 Vaccine (7 - Moderna risk series) 11/09/2021   INFLUENZA VACCINE  05/04/2022    Colorectal cancer screening: No longer required.   Lung Cancer Screening: (Low Dose CT Chest recommended if Age 83-80years, 30 pack-year currently smoking OR have quit w/in 15years.) does not  qualify.   Lung Cancer Screening Referral: no  Additional Screening:  Hepatitis C Screening: does not qualify;   Vision Screening: Recommended annual ophthalmology exams for early detection of glaucoma and other disorders of the eye. Is the patient up to date with their annual eye exam?  Yes  Who is the provider or what is the name of the office in which the patient attends annual eye exams? [facility] If pt is not established with a provider, would they like to be referred to a provider to establish care? No .   Dental Screening: Recommended annual dental exams for proper oral hygiene  Community Resource Referral / Chronic Care Management: CRR required this visit?  No   CCM required this visit?  No      Plan:     I have personally reviewed and noted the following in the patient's chart:   Medical and social history Use of alcohol, tobacco or illicit drugs  Current medications and supplements including opioid prescriptions. Patient is not currently taking opioid prescriptions. Functional ability and status  Nutritional status Physical activity Advanced directives List of other physicians Hospitalizations, surgeries, and ER visits in previous 12 months Vitals Screenings to include cognitive, depression, and falls Referrals and appointments  In addition, I have reviewed and discussed with patient certain preventive protocols, quality metrics, and best practice recommendations. A written personalized care plan for preventive services as well as general preventive health recommendations were provided to patient.     Kellie Simmering, LPN   11/09/6387   Nurse Notes: none

## 2022-06-08 NOTE — Patient Instructions (Addendum)
Todd Watson , Thank you for taking time to come for your Medicare Wellness Visit. I appreciate your ongoing commitment to your health goals. Please review the following plan we discussed and let me know if I can assist you in the future.   Screening recommendations/referrals: Colonoscopy: not required Recommended yearly ophthalmology/optometry visit for glaucoma screening and checkup Recommended yearly dental visit for hygiene and checkup  Vaccinations: Influenza vaccine: due Pneumococcal vaccine: completed 06/10/2021 Tdap vaccine: completed 07/04/2012, due 07/04/2022 Shingles vaccine: completed   Covid-19:  09/14/2021, 11/06/2020, 03/03/2020, 02/04/2020, 11/21/2019, 10/24/2019  Advanced directives: Advance directive discussed with you today. Even though you declined this today please call our office should you change your mind and we can give you the proper paperwork for you to fill out.     Conditions/risks identified: none  Next appointment: Follow up in one year for your annual wellness visit.   Preventive Care 50 Years and Older, Male Preventive care refers to lifestyle choices and visits with your health care provider that can promote health and wellness. What does preventive care include? A yearly physical exam. This is also called an annual well check. Dental exams once or twice a year. Routine eye exams. Ask your health care provider how often you should have your eyes checked. Personal lifestyle choices, including: Daily care of your teeth and gums. Regular physical activity. Eating a healthy diet. Avoiding tobacco and drug use. Limiting alcohol use. Practicing safe sex. Taking low doses of aspirin every day. Taking vitamin and mineral supplements as recommended by your health care provider. What happens during an annual well check? The services and screenings done by your health care provider during your annual well check will depend on your age, overall health, lifestyle risk  factors, and family history of disease. Counseling  Your health care provider may ask you questions about your: Alcohol use. Tobacco use. Drug use. Emotional well-being. Home and relationship well-being. Sexual activity. Eating habits. History of falls. Memory and ability to understand (cognition). Work and work Statistician. Screening  You may have the following tests or measurements: Height, weight, and BMI. Blood pressure. Lipid and cholesterol levels. These may be checked every 5 years, or more frequently if you are over 5 years old. Skin check. Lung cancer screening. You may have this screening every year starting at age 54 if you have a 30-pack-year history of smoking and currently smoke or have quit within the past 15 years. Fecal occult blood test (FOBT) of the stool. You may have this test every year starting at age 54. Flexible sigmoidoscopy or colonoscopy. You may have a sigmoidoscopy every 5 years or a colonoscopy every 10 years starting at age 49. Prostate cancer screening. Recommendations will vary depending on your family history and other risks. Hepatitis C blood test. Hepatitis B blood test. Sexually transmitted disease (STD) testing. Diabetes screening. This is done by checking your blood sugar (glucose) after you have not eaten for a while (fasting). You may have this done every 1-3 years. Abdominal aortic aneurysm (AAA) screening. You may need this if you are a current or former smoker. Osteoporosis. You may be screened starting at age 18 if you are at high risk. Talk with your health care provider about your test results, treatment options, and if necessary, the need for more tests. Vaccines  Your health care provider may recommend certain vaccines, such as: Influenza vaccine. This is recommended every year. Tetanus, diphtheria, and acellular pertussis (Tdap, Td) vaccine. You may need a Td booster  every 10 years. Zoster vaccine. You may need this after age  75. Pneumococcal 13-valent conjugate (PCV13) vaccine. One dose is recommended after age 70. Pneumococcal polysaccharide (PPSV23) vaccine. One dose is recommended after age 40. Talk to your health care provider about which screenings and vaccines you need and how often you need them. This information is not intended to replace advice given to you by your health care provider. Make sure you discuss any questions you have with your health care provider. Document Released: 10/17/2015 Document Revised: 06/09/2016 Document Reviewed: 07/22/2015 Elsevier Interactive Patient Education  2017 [place] Prevention in the Home Falls can cause injuries. They can happen to people of all ages. There are many things you can do to make your home safe and to help prevent falls. What can I do on the outside of my home? Regularly fix the edges of walkways and driveways and fix any cracks. Remove anything that might make you trip as you walk through a door, such as a raised step or threshold. Trim any bushes or trees on the path to your home. Use bright outdoor lighting. Clear any walking paths of anything that might make someone trip, such as rocks or tools. Regularly check to see if handrails are loose or broken. Make sure that both sides of any steps have handrails. Any raised decks and porches should have guardrails on the edges. Have any leaves, snow, or ice cleared regularly. Use sand or salt on walking paths during winter. Clean up any spills in your garage right away. This includes oil or grease spills. What can I do in the bathroom? Use night lights. Install grab bars by the toilet and in the tub and shower. Do not use towel bars as grab bars. Use non-skid mats or decals in the tub or shower. If you need to sit down in the shower, use a plastic, non-slip stool. Keep the floor dry. Clean up any water that spills on the floor as soon as it happens. Remove soap buildup in the tub or shower  regularly. Attach bath mats securely with double-sided non-slip rug tape. Do not have throw rugs and other things on the floor that can make you trip. What can I do in the bedroom? Use night lights. Make sure that you have a light by your bed that is easy to reach. Do not use any sheets or blankets that are too big for your bed. They should not hang down onto the floor. Have a firm chair that has side arms. You can use this for support while you get dressed. Do not have throw rugs and other things on the floor that can make you trip. What can I do in the kitchen? Clean up any spills right away. Avoid walking on wet floors. Keep items that you use a lot in easy-to-reach places. If you need to reach something above you, use a strong step stool that has a grab bar. Keep electrical cords out of the way. Do not use floor polish or wax that makes floors slippery. If you must use wax, use non-skid floor wax. Do not have throw rugs and other things on the floor that can make you trip. What can I do with my stairs? Do not leave any items on the stairs. Make sure that there are handrails on both sides of the stairs and use them. Fix handrails that are broken or loose. Make sure that handrails are as long as the stairways. Check any carpeting to  make sure that it is firmly attached to the stairs. Fix any carpet that is loose or worn. Avoid having throw rugs at the top or bottom of the stairs. If you do have throw rugs, attach them to the floor with carpet tape. Make sure that you have a light switch at the top of the stairs and the bottom of the stairs. If you do not have them, ask someone to add them for you. What else can I do to help prevent falls? Wear shoes that: Do not have high heels. Have rubber bottoms. Are comfortable and fit you well. Are closed at the toe. Do not wear sandals. If you use a stepladder: Make sure that it is fully opened. Do not climb a closed stepladder. Make sure that  both sides of the stepladder are locked into place. Ask someone to hold it for you, if possible. Clearly mark and make sure that you can see: Any grab bars or handrails. First and last steps. Where the edge of each step is. Use tools that help you move around (mobility aids) if they are needed. These include: Canes. Walkers. Scooters. Crutches. Turn on the lights when you go into a dark area. Replace any light bulbs as soon as they burn out. Set up your furniture so you have a clear path. Avoid moving your furniture around. If any of your floors are uneven, fix them. If there are any pets around you, be aware of where they are. Review your medicines with your doctor. Some medicines can make you feel dizzy. This can increase your chance of falling. Ask your doctor what other things that you can do to help prevent falls. This information is not intended to replace advice given to you by your health care provider. Make sure you discuss any questions you have with your health care provider. Document Released: 07/17/2009 Document Revised: 02/26/2016 Document Reviewed: 10/25/2014 Elsevier Interactive Patient Education  2017 [employer].

## 2022-06-15 ENCOUNTER — Encounter: Payer: Medicare HMO | Admitting: Physical Medicine and Rehabilitation

## 2022-07-30 ENCOUNTER — Telehealth: Payer: Self-pay | Admitting: *Deleted

## 2022-07-30 ENCOUNTER — Encounter: Payer: Self-pay | Admitting: *Deleted

## 2022-07-30 NOTE — Patient Instructions (Signed)
Visit Information  Thank you for taking time to visit with me today. Please don't hesitate to contact me if I can be of assistance to you.   Following are the goals we discussed today:   Goals Addressed               This Visit's Progress     COMPLETED: No needs (pt-stated)        Care Coordination Interventions: Reviewed medications with patient and discussed adherence with all medications Reviewed scheduled/upcoming provider appointments including pending appointments and verified pt has completed his AWV for this year. Screening for signs and symptoms of depression related to chronic disease state  Assessed social determinant of health barriers          Please call the care guide team at [phone] if you need to cancel or reschedule your appointment.   If you are experiencing a Mental Health or [place] or need someone to talk to, please call the Suicide and Crisis Lifeline: 988  Patient verbalizes understanding of instructions and care plan provided today and agrees to view in [place]. Active MyChart status and patient understanding of how to access instructions and care plan via MyChart confirmed with patient.     No further follow up required: No needs at this time.  Raina Mina, RN Care Management Coordinator [place] Office [phone]

## 2022-07-30 NOTE — Patient Outreach (Signed)
  Care Coordination   Initial Visit Note   07/30/2022 Name: Todd Watson MRN: [id number] DOB: August 12, 1941  Todd Watson Joynt is a 81 y.o. year old male who sees Nafziger, Tommi Rumps, NP for primary care. I spoke with  Todd Watson Todd Watson by phone today.  What matters to the patients health and wellness today?  No needs    Goals Addressed               This Visit's Progress     COMPLETED: No needs (pt-stated)        Care Coordination Interventions: Reviewed medications with patient and discussed adherence with all medications Reviewed scheduled/upcoming provider appointments including pending appointments and verified pt has completed his AWV for this year. Screening for signs and symptoms of depression related to chronic disease state  Assessed social determinant of health barriers          SDOH assessments and interventions completed:  Yes  SDOH Interventions Today    Flowsheet Row Most Recent Value  SDOH Interventions   Food Insecurity Interventions Intervention Not Indicated  Housing Interventions Intervention Not Indicated  Transportation Interventions Intervention Not Indicated  Utilities Interventions Intervention Not Indicated        Care Coordination Interventions Activated:  Yes  Care Coordination Interventions:  Yes, provided   Follow up plan: No further intervention required.   Encounter Outcome:  Pt. Visit Completed   Raina Mina, RN Care Management Coordinator [place] [facility] [phone]

## 2022-08-11 DIAGNOSIS — N5201 Erectile dysfunction due to arterial insufficiency: Secondary | ICD-10-CM | POA: Diagnosis not present

## 2022-08-11 DIAGNOSIS — N481 Balanitis: Secondary | ICD-10-CM | POA: Diagnosis not present

## 2022-08-11 DIAGNOSIS — N401 Enlarged prostate with lower urinary tract symptoms: Secondary | ICD-10-CM | POA: Diagnosis not present

## 2022-08-11 DIAGNOSIS — R3912 Poor urinary stream: Secondary | ICD-10-CM | POA: Diagnosis not present

## 2022-08-23 ENCOUNTER — Telehealth: Payer: Self-pay | Admitting: Pharmacist

## 2022-08-23 NOTE — Progress Notes (Signed)
Patient returned my call and was reminded of his appointment tomorrow 08/24/22 at 4:00 with [name], Clinical Pharmacist.

## 2022-08-23 NOTE — Chronic Care Management (AMB) (Signed)
    Chronic Care Management Pharmacy Assistant   Name: Todd Watson  MRN: [id number] DOB: 10-12-1940  08/24/2022 APPOINTMENT REMINDER  Called [name], No answer, left message of appointment on 08/24/2022 at 4:00 via telephone visit with [name], Pharm D. Notified to have all medications, supplements, blood pressure and/or blood sugar logs available during appointment and to return call if need to reschedule.  Care Gaps: AWV - scheduled 06/14/2023 Last BP - 120/68 on 02/09/2022 Last A1C - 6.3 on 06/23/2021 Urine ACR - overdue Covid - overdue Flu - due HGA1C - overdue  Star Rating Drug: Atorvastatin 10 mg - no fill history, filled at [place] Lisinopril 10 mg - no fill history, filled at [place] Metformin 500 mg - no fill history, filled at [place]  Any gaps in medications fill history? No  [name] [facility]  Catering manager [phone]

## 2022-08-24 ENCOUNTER — Telehealth: Payer: Self-pay | Admitting: Adult Health

## 2022-08-24 ENCOUNTER — Telehealth: Payer: Medicare HMO

## 2022-08-24 ENCOUNTER — Telehealth: Payer: Self-pay | Admitting: Pharmacist

## 2022-08-24 NOTE — Telephone Encounter (Signed)
Patient called back again and was rescheduled to tomorrow at 3pm.

## 2022-08-24 NOTE — Telephone Encounter (Signed)
  Chronic Care Management   Outreach Note  08/24/2022 Name: KEESHAWN FAKHOURI MRN: [id number] DOB: 01/31/1941  Referred by: [name], NP  Patient had a phone appointment scheduled with clinical pharmacist today.  An unsuccessful telephone outreach was attempted today. The patient was referred to the pharmacist for assistance with care management and care coordination.   If possible, a message was left to return call to: [phone] or to [facility]: [place], PharmD, [place] Pharmacist [place] at [place]

## 2022-08-24 NOTE — Telephone Encounter (Signed)
Pt called at 4:43 pm to say his phone never rang.

## 2022-08-24 NOTE — Progress Notes (Deleted)
Chronic Care Management Pharmacy Note  08/24/2022 Name:  Todd Watson MRN:  [id number] DOB:  18-Apr-1941  Summary: BP is at goal < 140/90  Recommendations/Changes made from today's visit: -Recommended weekly/bi-weekly BP monitoring at home -Recommended stopping aspirin due to risk of bleeding and no clinical ASCVD and removed from med list  Plan: Follow up BP assessment in 3 months Follow up in 6 months  Subjective: Todd Watson is an 81 y.o. year old male who is a primary patient of [name], NP.  The CCM team was consulted for assistance with disease management and care coordination needs.    Engaged with patient by telephone for follow up visit in response to provider referral for pharmacy case management and/or care coordination services.   Consent to Services:  The patient was given information about Chronic Care Management services, agreed to services, and gave verbal consent prior to initiation of services.  Please see initial visit note for detailed documentation.   Patient Care Team: [name], NP as PCP - General (Family Medicine) Kary Kos, MD as Consulting Physician (Neurosurgery) Viona Gilmore, [facility] as Pharmacist (Pharmacist)  Recent office visits: 02/09/22 [name], NP: Patient presented for annual exam. Prescribed doxycycline BID x 7 days for blister.  01/22/22 [name], NP: Patient presented for muscle strain. Prescribed medrol dosepak.  10/07/2021 [name] NP - Patient was seen for bronchitis and additional issues. No medication changes. No follow up noted.    10/02/2021 [name] MD - Patient was seen for left shoulder pain and an additional issue. No medication changes. Follow up for pending xray.  Recent consult visits: 02/16/22 Noelle Penner ([place] [facility]): Patient presented for eye exam.   01/13/22 Charlyne Petrin, MD ([place] PCP): [facility] presented [facility]  02/03/22 [name], MD (ortho):  Patient presented for right shoulder pain. Trigger point injection administered and recommended Voltaren gel for pain.  04/20/21 Lenda Kelp (dermatology): Unable to access notes.  03/12/21 [name] (urology): Unable to access notes.  Hospital visits: 10/07/2021 [name] NP - Patient was seen for bronchitis and additional issues. No medication changes. No follow up noted.    10/02/2021 [name] MD - Patient was seen for left shoulder pain and an additional issue. No medication changes. Follow up for pending xray.   Objective:  Lab Results  Component Value Date   CREATININE 0.87 10/01/2021   BUN 20 10/01/2021   GFR 79.78 12/11/2020   GFRNONAA >60 10/01/2021   GFRAA >60 01/28/2020   NA 139 10/01/2021   K 4.2 10/01/2021   CALCIUM 9.2 10/01/2021   CO2 27 10/01/2021   GLUCOSE 125 (H) 10/01/2021    Lab Results  Component Value Date/Time   HGBA1C 6.3 (A) 06/23/2021 09:10 AM   HGBA1C 6.9 (H) 12/11/2020 10:32 AM   HGBA1C 6.9 (H) 01/28/2020 12:23 PM   GFR 79.78 12/11/2020 10:32 AM   GFR 92.21 09/28/2018 11:03 AM   MICROALBUR 2.2 (H) 04/12/2017 10:28 AM    Last diabetic Eye exam:  Lab Results  Component Value Date/Time   HMDIABEYEEXA No Retinopathy 02/16/2022 12:00 AM    Last diabetic Foot exam: No results found for: "HMDIABFOOTEX"   Lab Results  Component Value Date   CHOL 144 12/11/2020   HDL 44.70 12/11/2020   LDLCALC 84 12/11/2020   TRIG 76.0 12/11/2020   CHOLHDL 3 12/11/2020       Latest Ref Rng & Units 12/11/2020   10:32 AM 09/29/2019  10:31 AM 09/28/2018   11:03 AM  Hepatic Function  Total Protein 6.0 - 8.3 g/dL 6.9  7.1  6.9   Albumin 3.5 - 5.2 g/dL 4.3  4.0  4.6   AST 0 - 37 U/L _0 ALT 0 - 53 U/L _1 Alk Phosphatase 39 - 117 U/L 64  49  50   Total Bilirubin 0.2 - 1.2 mg/dL 0.9  0.7  0.7     Lab Results  Component Value Date/Time   TSH 2.17 12/11/2020 10:32 AM   TSH 2.30 09/28/2018 11:03 AM       Latest Ref Rng  & Units 10/01/2021   11:37 PM 12/11/2020   10:32 AM 01/28/2020   12:23 PM  CBC  WBC 4.0 - 10.5 K/uL 8.8  5.1  8.9   Hemoglobin 13.0 - 17.0 g/dL 13.7  14.0  16.2   Hematocrit 39.0 - 52.0 % 40.9  40.8  47.7   Platelets 150 - 400 K/uL 173  185.0  193     No results found for: "VD25OH"  Clinical ASCVD: No  The ASCVD Risk score (Arnett DK, et al., 2019) failed to calculate for the following reasons:   The 2019 ASCVD risk score is only valid for ages 45 to 55       07/30/2022    2:22 PM 06/08/2022   10:13 AM 02/09/2022    7:15 AM  Depression screen PHQ 2/9  Decreased Interest 0 0 0  Down, Depressed, Hopeless 0 0 0  PHQ - 2 Score 0 0 0  Altered sleeping   0  Tired, decreased energy   0  Change in appetite   0  Feeling bad or failure about yourself    0  Trouble concentrating   0  Moving slowly or fidgety/restless   0  Suicidal thoughts   0  PHQ-9 Score   0  Difficult doing work/chores   Not difficult at all      Social History   Tobacco Use  Smoking Status Never  Smokeless Tobacco Never   BP Readings from Last 3 Encounters:  06/08/22 (!) 110/58  03/19/22 (!) 157/74  02/09/22 120/68   Pulse Readings from Last 3 Encounters:  06/08/22 (!) 59  03/19/22 (!) 44  02/09/22 (!) 53   Wt Readings from Last 3 Encounters:  06/08/22 160 lb 6.4 oz (72.8 kg)  03/19/22 155 lb (70.3 kg)  02/09/22 156 lb (70.8 kg)   BMI Readings from Last 3 Encounters:  06/08/22 25.50 kg/m  03/19/22 24.28 kg/m  02/09/22 24.80 kg/m    Assessment/Interventions: Review of patient past medical history, allergies, medications, health status, including review of consultants reports, laboratory and other test data, was performed as part of comprehensive evaluation and provision of chronic care management services.   SDOH:  (Social Determinants of Health) assessments and interventions performed: Yes SDOH Interventions    Flowsheet Row Telephone from 07/30/2022 in [place] from 06/08/2022 in [place] at [place] Management from 08/31/2021 in [place] at [place] from 06/02/2021 in [place] at [place] Interventions Intervention Not Indicated Intervention Not Indicated -- Intervention Not Indicated  Housing Interventions Intervention Not Indicated -- -- Intervention Not Indicated  Transportation Interventions Intervention Not Indicated Intervention Not Indicated Intervention Not Indicated Intervention Not Indicated  Utilities Interventions Intervention Not  Indicated -- -- --  Financial Strain Interventions -- Intervention Not Indicated Intervention Not Indicated Intervention Not Indicated  Physical Activity Interventions -- Patient Refused, Other (Comments) -- Intervention Not Indicated  Stress Interventions -- Intervention Not Indicated -- Intervention Not Indicated  Social Connections Interventions -- -- -- Intervention Not Indicated       SDOH Screenings   Food Insecurity: No Food Insecurity (07/30/2022)  Housing: Low Risk  (07/30/2022)  Transportation Needs: No Transportation Needs (07/30/2022)  Utilities: Not At Risk (07/30/2022)  Depression (PHQ2-9): Low Risk  (07/30/2022)  Financial Resource Strain: Low Risk  (06/08/2022)  Physical Activity: Inactive (06/08/2022)  Social Connections: Socially Integrated (06/02/2021)  Stress: No Stress Concern Present (06/08/2022)  Tobacco Use: Low Risk  (07/30/2022)    [place]  No Known Allergies  Medications Reviewed Today     Reviewed by Tobi Bastos, RN (Registered Nurse) on 07/30/22 at 9  Med List Status: <None>   Medication Order Taking? Sig Documenting Provider Last Dose Status Informant  atorvastatin (LIPITOR) 10 MG tablet [id number] No Take 10 mg by mouth at bedtime. [provider] Taking Active Spouse/Significant Other  Boswellia-Glucosamine-Vit D  (OSTEO BI-FLEX ONE PER DAY PO) [id number] No Take 1 tablet by mouth daily. [provider] Taking Active Spouse/Significant Other  diclofenac Sodium (VOLTAREN) 1 % GEL [id number] No Apply 1 application topically 4 (four) times daily as needed (pain).  Patient not taking: Reported on 06/08/2022   [provider] Not Taking Active Spouse/Significant Other  gabapentin (NEURONTIN) 100 MG capsule [id number] No Take 1 capsule (100 mg total) by mouth at bedtime.  Patient not taking: Reported on 06/08/2022   Mcarthur Rossetti, MD Not Taking Active   levothyroxine (SYNTHROID) 88 MCG tablet [id number] No Take 88 mcg by mouth daily before breakfast. [provider] Taking Active Spouse/Significant Other  lisinopril (PRINIVIL,ZESTRIL) 10 MG tablet [id number] No Take 5 mg by mouth in the morning and at bedtime.  [provider] Taking Active Spouse/Significant Other  Magnesium 250 MG TABS [id number] No Take by mouth daily. [provider] Taking Active   meloxicam (MOBIC) 7.5 MG tablet [id number] No Take 1 tablet daily as needed for elbow pain. Molpus, John, MD Taking Active   metFORMIN (GLUCOPHAGE) 500 MG tablet [id number] No Take by mouth daily with breakfast. [provider] Taking Active   Multiple Vitamin (MULTIVITAMIN) tablet [id number] No Take 1 tablet by mouth daily. [provider] Taking Active Spouse/Significant Other  Omega-3 1000 MG CAPS [id number] No Take 1,000 mg by mouth daily. [provider] Taking Active Spouse/Significant Other  omeprazole (PRILOSEC) 20 MG capsule [id number] No Take 20 mg by mouth in the morning and at bedtime. [provider] Taking Active Spouse/Significant Other  Probiotic Product (PROBIOTIC DAILY PO) [id number] No Take 1 capsule by mouth daily.  [provider] Taking Active Spouse/Significant Other  psyllium (METAMUCIL) 58.6 % powder [id number] No Take 1 packet by mouth daily as needed  (constipation).   Patient not taking: Reported on 06/08/2022   [provider] Not Taking Active Spouse/Significant Other  sildenafil (VIAGRA) 100 MG tablet [id number] No Take 100 mg by mouth as needed for erectile dysfunction.   Patient not taking: Reported on 06/08/2022   [provider] Not Taking Active Spouse/Significant Other  tamsulosin (FLOMAX) 0.4 MG CAPS capsule [id number] No Take 0.4 mg by mouth at bedtime. [provider] Taking Active Spouse/Significant Other  vitamin C (ASCORBIC ACID) 500 MG tablet [id number] No Take 500 mg by  mouth daily. [provider] Taking Active Spouse/Significant Other            Patient Active Problem List   Diagnosis Date Noted   Mixed hyperlipidemia 11/06/2020   Essential hypertension, benign 07/04/2014   Hypothyroidism 07/04/2014   GERD (gastroesophageal reflux disease) 07/04/2014   Lumbar spondylosis 07/04/2014   Diabetes ([place]) 07/04/2014   Cholelithiases 07/04/2014   Herpes zoster 07/04/2014   History of renal calculi 07/04/2014   Diverticulitis of colon 07/04/2014   Hiatal hernia 07/04/2014    Immunization History  Administered Date(s) Administered   Fluad Quad(high Dose 65+) 06/27/2020, 06/23/2021   Influenza, High Dose Seasonal PF 07/31/2015, 07/04/2017   Influenza,inj,Quad PF,6+ Mos 07/04/2014, 08/05/2019   Influenza-Unspecified 08/21/2018   Moderna Sars-Covid-2 Vaccination 02/04/2020, 03/03/2020, 11/06/2020   PNEUMOCOCCAL CONJUGATE-20 06/10/2021   Pneumococcal Conjugate-13 07/04/2014   Zoster Recombinat (Shingrix) 04/03/2017, 07/04/2017   -follow up with Tommi Rumps? Dm follow up  -checking BP? Bgs?  -Recommended weekly/bi-weekly BP monitoring at home -Recommended stopping aspirin due to risk of bleeding and no clinical ASCVD and removed from med list  Conditions to be addressed/monitored:  Hypertension, Hyperlipidemia, Diabetes, GERD, Hypothyroidism, and BPH  Conditions addressed this  visit: Hypertension, BPH  There are no care plans that you recently modified to display for this patient.    Medication Assistance: None required.  Patient affirms current coverage meets needs.  Compliance/Adherence/Medication fill history: Care Gaps: Eye exam, tetanus, urine microalbumin, influenza, COVID booster, A1c ([facility]13/[facility]) Last BP - 120/68 on 02/09/2022 Last A1C - 6.3 on 06/23/2021  Star-Rating Drugs: Unable to access records from the [place] Atorvastatin 10 mg - filled at [place] Lisinopril 10 mg - filled at [facility] Metformin 500 mg - filled at [facility]  Patient's preferred pharmacy is:  [facility] [address], [place] - [address] [place] [place] [address] Phone: [phone] Fax: [facility] [address], [place] - [place] [address] Phone: [phone] Fax: [phone]  [facility] - [place], [place] - [address] [place] [address] Phone: [phone] Fax: [phone]   Uses pill box? Yes Pt endorses 90% compliance  We discussed: Benefits of medication synchronization, packaging and delivery as well as enhanced pharmacist oversight with [employer]. Patient decided to: Continue current medication management strategy  Care Plan and Follow Up Patient Decision:  Patient agrees to Care Plan and Follow-up.  Plan: Telephone follow up appointment with care management team member scheduled for:  6 months  [name], PharmD, [place] at [place] [phone]

## 2022-08-24 NOTE — Telephone Encounter (Signed)
Duplicate: Please disregard 

## 2022-08-25 ENCOUNTER — Telehealth: Payer: Self-pay | Admitting: Pharmacist

## 2022-08-25 ENCOUNTER — Ambulatory Visit (INDEPENDENT_AMBULATORY_CARE_PROVIDER_SITE_OTHER): Payer: Medicare HMO | Admitting: Pharmacist

## 2022-08-25 DIAGNOSIS — E1169 Type 2 diabetes mellitus with other specified complication: Secondary | ICD-10-CM

## 2022-08-25 DIAGNOSIS — I10 Essential (primary) hypertension: Secondary | ICD-10-CM

## 2022-08-25 NOTE — Telephone Encounter (Signed)
  Chronic Care Management   Outreach Note  08/25/2022 Name: Todd Watson MRN: [id number] DOB: 08/15/1941  Referred by: [name], NP  Patient had a phone appointment scheduled with clinical pharmacist today.  An unsuccessful telephone outreach was attempted today. The patient was referred to the pharmacist for assistance with care management and care coordination.   If possible, a message was left to return call to: [phone] or to [facility] at [facility]: [place], PharmD, [place] Pharmacist [place] at [place]

## 2022-08-25 NOTE — Progress Notes (Signed)
Chronic Care Management Pharmacy Note  09/02/2022 Name:  Todd Watson MRN:  [id number] DOB:  1941/01/25  Summary: BP is at goal < 140/90 but BP is fluctuating with current  Pt is having trouble using his current glucometer  Recommendations/Changes made from today's visit: -Recommended weekly/bi-weekly BP monitoring at home -Recommended bringing BP cuff to office visit to ensure accuracy -Requested new glucometer rx -Consider switching to [facility] for cost savings  Plan: Scheduled DM follow up with PCP Follow up after completing cost inquiry and for potential training with glucometer   Subjective: Todd Watson is an 81 y.o. year old male who is a primary patient of [name], NP.  The CCM team was consulted for assistance with disease management and care coordination needs.    Engaged with patient by telephone for follow up visit in response to provider referral for pharmacy case management and/or care coordination services.   Consent to Services:  The patient was given information about Chronic Care Management services, agreed to services, and gave verbal consent prior to initiation of services.  Please see initial visit note for detailed documentation.   Patient Care Team: [name], NP as PCP - General (Family Medicine) Kary Kos, MD as Consulting Physician (Neurosurgery) Viona Gilmore, [facility] as Pharmacist (Pharmacist)  Recent office visits: 02/09/22 [name], NP: Patient presented for annual exam. Prescribed doxycycline BID x 7 days for blister.  01/22/22 [name], NP: Patient presented for muscle strain. Prescribed medrol dosepak.  10/07/2021 [name] NP - Patient was seen for bronchitis and additional issues. No medication changes. No follow up noted.    10/02/2021 [name] MD - Patient was seen for left shoulder pain and an additional issue. No medication changes. Follow up for pending xray.  Recent consult  visits: 02/16/22 Noelle Penner ([facility]): Patient presented for eye exam.   01/13/22 Charlyne Petrin, MD ([facility] [facility]): [facility] presented [facility]  02/03/22 [name], MD (ortho): Patient presented for right shoulder pain. Trigger point injection administered and recommended Voltaren gel for pain.  04/20/21 Lenda Kelp (dermatology): Unable to access notes.  03/12/21 [name] (urology): Unable to access notes.  Hospital visits: 10/07/2021 [name] NP - Patient was seen for bronchitis and additional issues. No medication changes. No follow up noted.    10/02/2021 [name] MD - Patient was seen for left shoulder pain and an additional issue. No medication changes. Follow up for pending xray.   Objective:  Lab Results  Component Value Date   CREATININE 0.87 10/01/2021   BUN 20 10/01/2021   GFR 79.78 12/11/2020   GFRNONAA >60 10/01/2021   GFRAA >60 01/28/2020   NA 139 10/01/2021   K 4.2 10/01/2021   CALCIUM 9.2 10/01/2021   CO2 27 10/01/2021   GLUCOSE 125 (H) 10/01/2021    Lab Results  Component Value Date/Time   HGBA1C 6.3 (A) 06/23/2021 09:10 AM   HGBA1C 6.9 (H) 12/11/2020 10:32 AM   HGBA1C 6.9 (H) 01/28/2020 12:23 PM   GFR 79.78 12/11/2020 10:32 AM   GFR 92.21 09/28/2018 11:03 AM   MICROALBUR 2.2 (H) 04/12/2017 10:28 AM    Last diabetic Eye exam:  Lab Results  Component Value Date/Time   HMDIABEYEEXA No Retinopathy 02/16/2022 12:00 AM    Last diabetic Foot exam: No results found for: "HMDIABFOOTEX"   Lab Results  Component Value Date   CHOL 144 12/11/2020   HDL 44.70 12/11/2020   LDLCALC 84 12/11/2020  TRIG 76.0 12/11/2020   CHOLHDL 3 12/11/2020       Latest Ref Rng & Units 12/11/2020   10:32 AM 09/29/2019   10:31 AM 09/28/2018   11:03 AM  Hepatic Function  Total Protein 6.0 - 8.3 g/dL 6.9  7.1  6.9   Albumin 3.5 - 5.2 g/dL 4.3  4.0  4.6   AST 0 - 37 U/L _0 ALT 0 - 53 U/L _1 Alk Phosphatase 39  - 117 U/L 64  49  50   Total Bilirubin 0.2 - 1.2 mg/dL 0.9  0.7  0.7     Lab Results  Component Value Date/Time   TSH 2.17 12/11/2020 10:32 AM   TSH 2.30 09/28/2018 11:03 AM       Latest Ref Rng & Units 10/01/2021   11:37 PM 12/11/2020   10:32 AM 01/28/2020   12:23 PM  CBC  WBC 4.0 - 10.5 K/uL 8.8  5.1  8.9   Hemoglobin 13.0 - 17.0 g/dL 13.7  14.0  16.2   Hematocrit 39.0 - 52.0 % 40.9  40.8  47.7   Platelets 150 - 400 K/uL 173  185.0  193     No results found for: "VD25OH"  Clinical ASCVD: No  The ASCVD Risk score (Arnett DK, et al., 2019) failed to calculate for the following reasons:   The 2019 ASCVD risk score is only valid for ages 52 to 44       07/30/2022    2:22 PM 06/08/2022   10:13 AM 02/09/2022    7:15 AM  Depression screen PHQ 2/9  Decreased Interest 0 0 0  Down, Depressed, Hopeless 0 0 0  PHQ - 2 Score 0 0 0  Altered sleeping   0  Tired, decreased energy   0  Change in appetite   0  Feeling bad or failure about yourself    0  Trouble concentrating   0  Moving slowly or fidgety/restless   0  Suicidal thoughts   0  PHQ-9 Score   0  Difficult doing work/chores   Not difficult at all      Social History   Tobacco Use  Smoking Status Never  Smokeless Tobacco Never   BP Readings from Last 3 Encounters:  06/08/22 (!) 110/58  03/19/22 (!) 157/74  02/09/22 120/68   Pulse Readings from Last 3 Encounters:  06/08/22 (!) 59  03/19/22 (!) 44  02/09/22 (!) 53   Wt Readings from Last 3 Encounters:  06/08/22 160 lb 6.4 oz (72.8 kg)  03/19/22 155 lb (70.3 kg)  02/09/22 156 lb (70.8 kg)   BMI Readings from Last 3 Encounters:  06/08/22 25.50 kg/m  03/19/22 24.28 kg/m  02/09/22 24.80 kg/m    Assessment/Interventions: Review of patient past medical history, allergies, medications, health status, including review of consultants reports, laboratory and other test data, was performed as part of comprehensive evaluation and provision of chronic care  management services.   SDOH:  (Social Determinants of Health) assessments and interventions performed: Yes  SDOH Interventions    Flowsheet Row Chronic Care Management from 08/25/2022 in [place] at [place] from 07/30/2022 in [place] from 06/08/2022 in [place] at [place] Management from 08/31/2021 in [place] at [place] from 06/02/2021 in [place] at [place] Interventions -- Intervention  Not Indicated Intervention Not Indicated -- Intervention Not Indicated  Housing Interventions -- Intervention Not Indicated -- -- Intervention Not Indicated  Transportation Interventions -- Intervention Not Indicated Intervention Not Indicated Intervention Not Indicated Intervention Not Indicated  Utilities Interventions -- Intervention Not Indicated -- -- --  Financial Strain Interventions Intervention Not Indicated -- Intervention Not Indicated Intervention Not Indicated Intervention Not Indicated  Physical Activity Interventions -- -- Patient Refused, Other (Comments) -- Intervention Not Indicated  Stress Interventions -- -- Intervention Not Indicated -- Intervention Not Indicated  Social Connections Interventions -- -- -- -- Intervention Not Indicated       SDOH Screenings   Food Insecurity: No Food Insecurity (07/30/2022)  Housing: Low Risk  (07/30/2022)  Transportation Needs: No Transportation Needs (07/30/2022)  Utilities: Not At Risk (07/30/2022)  Depression (PHQ2-9): Low Risk  (07/30/2022)  Financial Resource Strain: Low Risk  (09/02/2022)  Physical Activity: Inactive (06/08/2022)  Social Connections: Socially Integrated (06/02/2021)  Stress: No Stress Concern Present (06/08/2022)  Tobacco Use: Low Risk  (07/30/2022)    [place]  No Known Allergies  Medications Reviewed Today     Reviewed by  Tobi Bastos, RN (Registered Nurse) on 07/30/22 at 21  Med List Status: <None>   Medication Order Taking? Sig Documenting Provider Last Dose Status Informant  atorvastatin (LIPITOR) 10 MG tablet [id number] No Take 10 mg by mouth at bedtime. [provider] Taking Active Spouse/Significant Other  Boswellia-Glucosamine-Vit D (OSTEO BI-FLEX ONE PER DAY PO) [id number] No Take 1 tablet by mouth daily. [provider] Taking Active Spouse/Significant Other  diclofenac Sodium (VOLTAREN) 1 % GEL [id number] No Apply 1 application topically 4 (four) times daily as needed (pain).  Patient not taking: Reported on 06/08/2022   [provider] Not Taking Active Spouse/Significant Other  gabapentin (NEURONTIN) 100 MG capsule [id number] No Take 1 capsule (100 mg total) by mouth at bedtime.  Patient not taking: Reported on 06/08/2022   Mcarthur Rossetti, MD Not Taking Active   levothyroxine (SYNTHROID) 88 MCG tablet [id number] No Take 88 mcg by mouth daily before breakfast. [provider] Taking Active Spouse/Significant Other  lisinopril (PRINIVIL,ZESTRIL) 10 MG tablet [id number] No Take 5 mg by mouth in the morning and at bedtime.  [provider] Taking Active Spouse/Significant Other  Magnesium 250 MG TABS [id number] No Take by mouth daily. [provider] Taking Active   meloxicam (MOBIC) 7.5 MG tablet [id number] No Take 1 tablet daily as needed for elbow pain. Molpus, John, MD Taking Active   metFORMIN (GLUCOPHAGE) 500 MG tablet [id number] No Take by mouth daily with breakfast. [provider] Taking Active   Multiple Vitamin (MULTIVITAMIN) tablet [id number] No Take 1 tablet by mouth daily. [provider] Taking Active Spouse/Significant Other  Omega-3 1000 MG CAPS [id number] No Take 1,000 mg by mouth daily. [provider] Taking Active Spouse/Significant Other  omeprazole (PRILOSEC) 20 MG capsule [id number] No Take 20 mg by mouth  in the morning and at bedtime. [provider] Taking Active Spouse/Significant Other  Probiotic Product (PROBIOTIC DAILY PO) [id number] No Take 1 capsule by mouth daily.  [provider] Taking Active Spouse/Significant Other  psyllium (METAMUCIL) 58.6 % powder [id number] No Take 1 packet by mouth daily as needed (constipation).   Patient not taking: Reported on 06/08/2022   [provider] Not Taking Active Spouse/Significant Other  sildenafil (VIAGRA) 100 MG tablet [id number] No Take 100 mg by mouth as needed for erectile dysfunction.   Patient not taking: Reported  on 06/08/2022   [provider] Not Taking Active Spouse/Significant Other  tamsulosin (FLOMAX) 0.4 MG CAPS capsule [id number] No Take 0.4 mg by mouth at bedtime. [provider] Taking Active Spouse/Significant Other  vitamin C (ASCORBIC ACID) 500 MG tablet [id number] No Take 500 mg by mouth daily. [provider] Taking Active Spouse/Significant Other            Patient Active Problem List   Diagnosis Date Noted   Mixed hyperlipidemia 11/06/2020   Essential hypertension, benign 07/04/2014   Hypothyroidism 07/04/2014   GERD (gastroesophageal reflux disease) 07/04/2014   Lumbar spondylosis 07/04/2014   Diabetes ([place]) 07/04/2014   Cholelithiases 07/04/2014   Herpes zoster 07/04/2014   History of renal calculi 07/04/2014   Diverticulitis of colon 07/04/2014   Hiatal hernia 07/04/2014    Immunization History  Administered Date(s) Administered   Fluad Quad(high Dose 65+) 06/27/2020, 06/23/2021   Influenza, High Dose Seasonal PF 07/31/2015, 07/04/2017   Influenza,inj,Quad PF,6+ Mos 07/04/2014, 08/05/2019   Influenza-Unspecified 08/21/2018   Moderna Sars-Covid-2 Vaccination 02/04/2020, 03/03/2020, 11/06/2020   PNEUMOCOCCAL CONJUGATE-20 06/10/2021   Pneumococcal Conjugate-13 07/04/2014   Td (Adult),unspecified 07/16/2022   Zoster Recombinat (Shingrix) 04/03/2017, 07/04/2017    Patient did check his BP yesterday 3 different times and got very different readings. He is also having trouble pricking his finger and getting any blood to come out. He inquired if he needs to get a new glucometer or if he needs to get one from the [place].  Patient has some numbness in his arm and this only bothers him every once a while.   Patient is still getting medications from the [facility] but they are no longer free. Some of them have a $15 copay. He is unsure why this changed but is generally unhappy with the [facility] [place].  Conditions to be addressed/monitored:  Hypertension, Hyperlipidemia, Diabetes, GERD, Hypothyroidism, and BPH  Conditions addressed this visit: Hypertension, BPH  There are no care plans that you recently modified to display for this patient.    Medication Assistance: None required.  Patient affirms current coverage meets needs.  Compliance/Adherence/Medication fill history: Care Gaps: Eye exam, tetanus, urine microalbumin, influenza, COVID booster, A1c (done at the [facility] 01/13/22) Last BP - 120/68 on 02/09/2022 Last A1C - 6.3 on 06/23/2021  [facility] Drugs: Unable to access records from the [place] Atorvastatin 10 mg - filled at [place] Lisinopril 10 mg - filled at [facility] Metformin 500 mg - filled at [facility]  Patient's preferred pharmacy is:  [facility] [address], [place] - [address] [place] [place] [address] Phone: [phone] Fax: [facility] [address] [place] [address] Phone: [phone] Fax: [phone]  [facility] - [place], [place] - [address] [place] [address] Phone: [phone] Fax: [phone]   Uses pill box? Yes Pt endorses 90% compliance  We discussed: Benefits of medication synchronization, packaging and delivery as well as enhanced pharmacist oversight with [facility]. Patient decided to: Continue current  medication management strategy  Care Plan and Follow Up Patient Decision:  Patient agrees to Care Plan and Follow-up.  Plan: The care management team will reach out to the patient again over the next 14 days.  [name], PharmD, [place] Pharmacist [place] at [place]

## 2022-08-25 NOTE — Patient Instructions (Addendum)
Hi Terius,  It was great to catch up again! Keep on checking the blood pressures and I will let you know what I can find out about the glucometer.  Please reach out to me if you have any questions or need anything!  Best, Maddie  [name], PharmD, [place] Pharmacist [place] [facility] [place]   Visit Information   Goals Addressed   None    Patient Care Plan: CCM Pharmacy Care Plan     Problem Identified: Problem: Hypertension, Hyperlipidemia, Diabetes, GERD, Hypothyroidism, and BPH      Long-Range Goal: Patient-Specific Goal   Start Date: 08/31/2021  Expected End Date: 08/31/2022  Recent Progress: On track  Priority: High  Note:   Current Barriers:  Unable to independently monitor therapeutic efficacy  Pharmacist Clinical Goal(s):  Patient will achieve adherence to monitoring guidelines and medication adherence to achieve therapeutic efficacy through collaboration with PharmD and provider.   Interventions: 1:1 collaboration with [name], NP regarding development and update of comprehensive plan of care as evidenced by provider attestation and co-signature Inter-disciplinary care team collaboration (see longitudinal plan of care) Comprehensive medication review performed; medication list updated in electronic medical record  Hypertension (BP goal <140/90) -Controlled -Current treatment: Lisinopril 10 mg 1/2 tablet twice daily - Appropriate, Effective, Safe, Accessible -Medications previously tried: unknown  -Current home readings: 125/62 HR 65, 113/72 HR 75, 138/65 HR 66 (all yesterday's readings) has an arm cuff that he pumps up himself (does not use often) -Current dietary habits: doesn't add much salt to food but eats out at [employer] for breakfast often; wife doesn't like to cook much anymore -Current exercise habits: active with chopping wood -Denies hypotensive/hypertensive symptoms -Educated on BP goals and benefits of  medications for prevention of heart attack, stroke and kidney damage; Daily salt intake goal < 2300 mg; Importance of home blood pressure monitoring; Proper BP monitoring technique; -Counseled to monitor BP at home weekly, document, and provide log at future appointments -Counseled on diet and exercise extensively Recommended to continue current medication  Hyperlipidemia: (LDL goal < 100) -Controlled -Current treatment: Atorvastatin 10 mg 1 tablet at bedtime - Appropriate, Effective, Safe, Accessible Omega 3 fish oil 1000 mg 1 capsule daily - Appropriate, Effective, Safe, Accessible -Medications previously tried: none  -Current dietary patterns: eats out more often than he should -Current exercise habits: active around the house -Educated on Cholesterol goals;  Benefits of statin for ASCVD risk reduction; -Counseled on diet and exercise extensively Recommended to continue current medication Recommended stopping fish oil given risk vs benefit  Diabetes (A1c goal <7%) -Controlled -Current medications: Metformin 500 mg daily with breakfast - Appropriate, Effective, Safe, Accessible -Medications previously tried: none  -Current home glucose readings fasting glucose: hard to check blood sugars post prandial glucose: n/a -Denies hypoglycemic/hyperglycemic symptoms -Current meal patterns:  breakfast: [employer] sausage, egg & biscuit  lunch: n/a  dinner: n/a snacks: n/a drinks: coffee in AM -Current exercise: active around the house -Educated on A1c and blood sugar goals; Benefits of routine self-monitoring of blood sugar; Continuous glucose monitoring; Carbohydrate counting and/or plate method -Counseled to check feet daily and get yearly eye exams -Counseled on diet and exercise extensively Recommended to continue current medication Requested new glucometer prescription.  Hypothyroidism (Goal: TSH 0.35-4.5) -Controlled -Current treatment  Levothyroxine 88 mcg 1 tablet  daily before breakfast - Appropriate, Effective, Safe, Accessible -Medications previously tried: none  -Recommended to continue current medication  Osteoarthritis (Goal: minimize pain) -Controlled -Current treatment  Osteo Biflex  daily - Appropriate, Effective, Safe, Accessible Diclofenac gel 1% as needed - Appropriate, Effective, Safe, Accessible Naproxen 220 mg daily as needed - Appropriate, Effective, Safe, Accessible -Medications previously tried: none  -Recommended limiting use of NSAIDs.  BPH (Goal: minimize symptoms) -Controlled -Current treatment  Tamsulosin 0.4 mg 1 capsule daily - Appropriate, Effective, Safe, Accessible -Medications previously tried: none  -Recommended to continue current medication  GERD/hiatal hernia (Goal: minimize symptoms) -Controlled -Current treatment  Omeprazole 20 mg 1 capsule twice daily - only taking in the morning - Appropriate, Effective, Safe, Accessible -Medications previously tried: none  -Recommended to continue current medication   Health Maintenance -Vaccine gaps: pneumonia, COVID booster, influenza -Current therapy:  Multivitamin 1 tablet daily Probiotic 1 tablet daily Metamucil powder as needed - every 3 days Sildenafil 100 mg as needed Vitamin C 500 mg 1 tablet daily Magnesium 250 mg 1 tablet daily -Educated on Cost vs benefit of each product must be carefully weighed by individual consumer -Patient is satisfied with current therapy and denies issues -Recommended to continue current medication  Patient Goals/Self-Care Activities Patient will:  - take medications as prescribed as evidenced by patient report and record review check blood pressure weekly, document, and provide at future appointments  Follow Up Plan: The care management team will reach out to the patient again over the next 14 days.         Patient verbalizes understanding of instructions and care plan provided today and agrees to view in [place]. Active  MyChart status and patient understanding of how to access instructions and care plan via MyChart confirmed with patient.    The pharmacy team will reach out to the patient again over the next 14 days.   Viona Gilmore, [facility]

## 2022-09-02 ENCOUNTER — Other Ambulatory Visit: Payer: Self-pay | Admitting: Adult Health

## 2022-09-02 DIAGNOSIS — E1169 Type 2 diabetes mellitus with other specified complication: Secondary | ICD-10-CM

## 2022-09-02 DIAGNOSIS — I10 Essential (primary) hypertension: Secondary | ICD-10-CM

## 2022-09-02 MED ORDER — BLOOD GLUCOSE MONITOR KIT: PACK | 0 refills | Status: AC

## 2022-09-07 ENCOUNTER — Encounter: Payer: Self-pay | Admitting: Adult Health

## 2022-09-07 ENCOUNTER — Ambulatory Visit (INDEPENDENT_AMBULATORY_CARE_PROVIDER_SITE_OTHER): Payer: Medicare HMO | Admitting: Adult Health

## 2022-09-07 VITALS — BP 122/84 | HR 56 | Temp 97.8°F | Ht 66.5 in | Wt 156.0 lb

## 2022-09-07 DIAGNOSIS — E1169 Type 2 diabetes mellitus with other specified complication: Secondary | ICD-10-CM | POA: Diagnosis not present

## 2022-09-07 DIAGNOSIS — Z23 Encounter for immunization: Secondary | ICD-10-CM

## 2022-09-07 DIAGNOSIS — I10 Essential (primary) hypertension: Secondary | ICD-10-CM | POA: Diagnosis not present

## 2022-09-07 LAB — POCT GLYCOSYLATED HEMOGLOBIN (HGB A1C): Hemoglobin A1C: 6.1 % — AB (ref 4.0–5.6)

## 2022-09-07 LAB — MICROALBUMIN / CREATININE URINE RATIO
Creatinine,U: 134.1 mg/dL
Microalb Creat Ratio: 1.1 mg/g (ref 0.0–30.0)
Microalb, Ur: 1.5 mg/dL (ref 0.0–1.9)

## 2022-09-07 NOTE — Progress Notes (Signed)
Subjective:    Patient ID: Todd Watson, male    DOB: 07-02-1941, 81 y.o.   MRN: [id number]  Diabetes   81 year old male who  has a past medical history of Arthritis, GERD (gastroesophageal reflux disease), Herpes zoster, Hiatal hernia, Hypothyroidism, and Osteoarthritis of lumbar spine.  He presents to the office today for follow up regarding HTN and DM Type 2  DM Type II - managed with metformin 500 mg daily at breakfast. He denies hypoglycemic events. He has thick callous on his fingers and has a hard time getting blood to check his blood sugar. [facility]. His last A1c was 6.2 - at the [place]   HTN - managed with lisinopril 5 mg in the morning and 5 mg in the evening. He denies dizziness, lightheadedness, chest pain, or shortness of breath.  BP Readings from Last 3 Encounters:  09/07/22 122/84  06/08/22 (!) 110/58  03/19/22 (!) 157/74    Review of Systems See HPI   Past Medical History:  Diagnosis Date   Arthritis    GERD (gastroesophageal reflux disease)    Herpes zoster    Hiatal hernia    Hypothyroidism    Osteoarthritis of lumbar spine     Social History   Socioeconomic History   Marital status: Married    Spouse name: Not on file   Number of children: 3   Years of education: 10   Highest education level: GED or equivalent  Occupational History   Occupation: retired  Tobacco Use   Smoking status: Never   Smokeless tobacco: Never  Vaping Use   Vaping Use: Never used  Substance and Sexual Activity   Alcohol use: No   Drug use: No   Sexual activity: Yes  Other Topics Concern   Not on file  Social History Narrative   Lives with wife.    3 children     6 grandchildren & 5 great grandchildren   Likes to fish    Social Determinants of Health   Financial Resource Strain: Low Risk  (09/02/2022)   Overall Financial Resource Strain (CARDIA)    Difficulty of Paying Living Expenses: Not very hard  Food Insecurity: No Food  Insecurity (07/30/2022)   Hunger Vital Sign    Worried About Running Out of Food in the Last Year: Never true    Ran Out of Food in the Last Year: Never true  Transportation Needs: No Transportation Needs (07/30/2022)   PRAPARE - Hydrologist (Medical): No    Lack of Transportation (Non-Medical): No  Physical Activity: Inactive (06/08/2022)   Exercise Vital Sign    Days of Exercise per Week: 0 days    Minutes of Exercise per Session: 0 min  Stress: No Stress Concern Present (06/08/2022)   [place]    Feeling of Stress : Not at all  Social Connections: [place] (06/02/2021)   Social Connection and Isolation Panel [NHANES]    Frequency of Communication with Friends and Family: Three times a week    Frequency of Social Gatherings with Friends and Family: Three times a week    Attends Religious Services: 1 to 4 times per year    Active Member of Clubs or Organizations: Yes    Attends Music therapist: More than 4 times per year    Marital Status: Married  Human resources officer Violence: Not At Risk (  06/02/2021)   Humiliation, Afraid, Rape, and Kick questionnaire    Fear of Current or Ex-Partner: No    Emotionally Abused: No    Physically Abused: No    Sexually Abused: No    Past Surgical History:  Procedure Laterality Date   APPENDECTOMY  2000   INGUINAL HERNIA REPAIR Bilateral    LUMBAR LAMINECTOMY/DECOMPRESSION MICRODISCECTOMY Right 01/28/2020   Procedure: Microdiscectomy - right - Lumbar four-Lumbar five;  Surgeon: Kary Kos, MD;  Location: [facility];  Service: Neurosurgery;  Laterality: Right;   SPINE SURGERY     spurs    Family History  Problem Relation Age of Onset   Heart disease Father        Valve disease   Kidney failure Mother     No Known Allergies  Current Outpatient Medications on File Prior to Visit  Medication Sig Dispense Refill   atorvastatin  (LIPITOR) 10 MG tablet Take 10 mg by mouth at bedtime.     blood glucose meter kit and supplies KIT Dispense based on patient and insurance preference. Use up to four times daily as directed. 1 each 0   Boswellia-Glucosamine-Vit D (OSTEO BI-FLEX ONE PER DAY PO) Take 1 tablet by mouth daily.     diclofenac Sodium (VOLTAREN) 1 % GEL Apply 1 application  topically 4 (four) times daily as needed (pain).     gabapentin (NEURONTIN) 100 MG capsule Take 1 capsule (100 mg total) by mouth at bedtime. 30 capsule 1   levothyroxine (SYNTHROID) 88 MCG tablet Take 88 mcg by mouth daily before breakfast.     lisinopril (PRINIVIL,ZESTRIL) 10 MG tablet Take 5 mg by mouth in the morning and at bedtime.      Magnesium 250 MG TABS Take by mouth daily.     meloxicam (MOBIC) 7.5 MG tablet Take 1 tablet daily as needed for elbow pain. 30 tablet 0   metFORMIN (GLUCOPHAGE) 500 MG tablet Take by mouth daily with breakfast.     Multiple Vitamin (MULTIVITAMIN) tablet Take 1 tablet by mouth daily.     Omega-3 1000 MG CAPS Take 1,000 mg by mouth daily.     omeprazole (PRILOSEC) 20 MG capsule Take 20 mg by mouth in the morning and at bedtime.     Probiotic Product (PROBIOTIC DAILY PO) Take 1 capsule by mouth daily.      psyllium (METAMUCIL) 58.6 % powder Take 1 packet by mouth daily as needed (constipation).     sildenafil (VIAGRA) 100 MG tablet Take 100 mg by mouth as needed for erectile dysfunction.     tamsulosin (FLOMAX) 0.4 MG CAPS capsule Take 0.4 mg by mouth at bedtime.     vitamin C (ASCORBIC ACID) 500 MG tablet Take 500 mg by mouth daily.     No current facility-administered medications on file prior to visit.    BP 122/84   Pulse (!) 56   Temp 97.8 F (36.6 C) (Oral)   Ht 5' 6.5" (1.689 m)   Wt 156 lb (70.8 kg)   SpO2 98%   BMI 24.80 kg/m       Objective:   Physical Exam Vitals and nursing note reviewed.  Constitutional:      Appearance: Normal appearance.  Cardiovascular:     Rate and Rhythm:  Normal rate and regular rhythm.     Pulses: Normal pulses.     Heart sounds: Normal heart sounds.  Pulmonary:     Effort: Pulmonary effort is normal.     Breath sounds: Normal  breath sounds.  Musculoskeletal:        General: Normal range of motion.  Skin:    General: Skin is warm and dry.     Capillary Refill: Capillary refill takes less than 2 seconds.  Neurological:     General: No focal deficit present.     Mental Status: He is alert and oriented to person, place, and time.        Assessment & Plan:  1. Type 2 diabetes mellitus with other specified complication, without long-term current use of insulin (HCC)  - POC HgB A1c- 6.1 - no change in medications - Microalbumin/Creatinine Ratio, Urine - [facility]   2. Essential hypertension, benign - well controlled.  - No change in medications   3. Need for immunization against influenza  - Flu Vaccine QUAD High Dose(Fluad)  [name], NP

## 2022-09-07 NOTE — Patient Instructions (Addendum)
Your A1c was 6.1 - this is good.   Talk to the [facility] about continuous glucose monitor such a Libre or Dexcom   Lets follow up after you get back from [place] in the spring

## 2022-09-13 ENCOUNTER — Telehealth: Payer: Self-pay | Admitting: Pharmacist

## 2022-09-13 NOTE — Chronic Care Management (AMB) (Signed)
    Chronic Care Management Pharmacy Assistant   Name: Todd Watson  MRN: [id number] DOB: [birth year]  Reason for Encounter: Follow up call   Spoke with patient, he has not picked up his new glucometer from [facility].  He states he will go pick this up today. He is aware to contact us if he has any trouble using it.    [place] Pharmacist Assistant [phone]

## 2022-11-18 ENCOUNTER — Telehealth: Payer: Self-pay

## 2022-11-18 NOTE — Progress Notes (Signed)
Care Management & Coordination Services Pharmacy Team  Reason for Encounter: Hypertension  Contacted patient to discuss hypertension disease state. Spoke with patient on 11/18/2022     Current antihypertensive regimen:  Lisinopril 10 mg twice daily  Patient verbally confirms he is taking the above medications as directed. Yes  How often are you checking your Blood Pressure?  Patient has been in [place] for the past 3 months, he forgot to bring his blood pressure cuff so he hasn't been checking. He states he will be home in 2 weeks and will start checking blood pressures again.   Current home BP readings: Patient doesn't have any current readings  Wrist or arm cuff: Arm cuff   OTC medications including pseudoephedrine or NSAIDs? Patient denies unless he has a cold and feels he needs nyquil or dayquil.  Any readings above 180/100?  Patient denies having any high readings in the past  What recent interventions/DTPs have been made by any provider to improve Blood Pressure control since last CPP Visit: No recent interventions  Any recent hospitalizations or ED visits since last visit with CPP? No recent hospital visits.   What diet changes have been made to improve Blood Pressure Control?  Patient tries to not eat sugars or foods with sugars Breakfast - patient has egg and cheese biscuit or cereal Lunch - patient has a sandwich if he is [place] Dinner - patient generally eats vegetables and a meat Caffeine intake - patient has 1 coffee daily Salt intake - patient doesn't add salt  What exercise is being done to improve your Blood Pressure Control?  Patient walks daily and boating in [place], he works around his house the rest of the year.   Adherence Review: Is the patient currently on ACE/ARB medication? Yes Does the patient have >5 day gap between last estimated fill dates? No fill history, filled with the [place] [facility]: AWV - completed 06/08/2022, scheduled 06/14/2023 Next  appointment - scheduled 02/16/2023 Last foot exam - completed 02/09/2022 Last eye exam - completed 02/16/2022 Covid - overdue Tdap - overdue eGFR - overdue   Star Rating Drug: Atorvastatin 10 mg - no fill history, filled at [place] Lisinopril 10 mg - no fill history, filled at [place] Metformin 500 mg - no fill history, filled at [place]  Chart Updates: Recent office visits:  09/07/2022 [name] NP - Patient was seen for Type 2 diabetes mellitus with other specified complication, without long-term current use of insulin and additional concerns. No medication changes.   Recent consult visits:  None  Hospital visits:  None  Medications: Outpatient Encounter Medications as of 11/18/2022  Medication Sig   atorvastatin (LIPITOR) 10 MG tablet Take 10 mg by mouth at bedtime.   blood glucose meter kit and supplies KIT Dispense based on patient and insurance preference. Use up to four times daily as directed.   Boswellia-Glucosamine-Vit D (OSTEO BI-FLEX ONE PER DAY PO) Take 1 tablet by mouth daily.   diclofenac Sodium (VOLTAREN) 1 % GEL Apply 1 application  topically 4 (four) times daily as needed (pain).   gabapentin (NEURONTIN) 100 MG capsule Take 1 capsule (100 mg total) by mouth at bedtime.   levothyroxine (SYNTHROID) 88 MCG tablet Take 88 mcg by mouth daily before breakfast.   lisinopril (PRINIVIL,ZESTRIL) 10 MG tablet Take 5 mg by mouth in the morning and at bedtime.    Magnesium 250 MG TABS Take by mouth daily.   meloxicam (MOBIC) 7.5 MG tablet Take 1 tablet daily as  needed for elbow pain.   metFORMIN (GLUCOPHAGE) 500 MG tablet Take by mouth daily with breakfast.   Multiple Vitamin (MULTIVITAMIN) tablet Take 1 tablet by mouth daily.   Omega-3 1000 MG CAPS Take 1,000 mg by mouth daily.   omeprazole (PRILOSEC) 20 MG capsule Take 20 mg by mouth in the morning and at bedtime.   Probiotic Product (PROBIOTIC DAILY PO) Take 1 capsule by mouth daily.    psyllium (METAMUCIL) 58.6 % powder Take 1  packet by mouth daily as needed (constipation).   sildenafil (VIAGRA) 100 MG tablet Take 100 mg by mouth as needed for erectile dysfunction.   tamsulosin (FLOMAX) 0.4 MG CAPS capsule Take 0.4 mg by mouth at bedtime.   vitamin C (ASCORBIC ACID) 500 MG tablet Take 500 mg by mouth daily.   No facility-administered encounter medications on file as of 11/18/2022.  Fill History:   Dispensed Days Supply Quantity Provider Pharmacy  GABAPENTIN 100MG    CAP 04/21/2022 30 30 each     MELOXICAM 7.5MG     TAB 03/19/2022 30 30 each    Dispensed Days Supply Quantity Provider Pharmacy  SILDENAFIL 100MG TAB 04/21/2021 30 30 each      Dispensed Days Supply Quantity Provider Pharmacy  TAMSULOSIN HYDROCHLORIDE 0.4MG CAP 03/12/2021 90 90 capsule     Recent Office Vitals: BP Readings from Last 3 Encounters:  09/07/22 122/84  06/08/22 (!) 110/58  03/19/22 (!) 157/74   Pulse Readings from Last 3 Encounters:  09/07/22 (!) 56  06/08/22 (!) 59  03/19/22 (!) 44    Wt Readings from Last 3 Encounters:  09/07/22 156 lb (70.8 kg)  06/08/22 160 lb 6.4 oz (72.8 kg)  03/19/22 155 lb (70.3 kg)     Kidney Function Lab Results  Component Value Date/Time   CREATININE 0.87 10/01/2021 11:37 PM   CREATININE 0.90 12/11/2020 10:32 AM   GFR 79.78 12/11/2020 10:32 AM   GFRNONAA >60 10/01/2021 11:37 PM   GFRAA >60 01/28/2020 12:23 PM       Latest Ref Rng & Units 10/01/2021   11:37 PM 12/11/2020   10:32 AM 01/28/2020   12:23 PM  BMP  Glucose 70 - 99 mg/dL 125  124  147   BUN 8 - 23 mg/dL 20  18  18   $ Creatinine 0.61 - 1.24 mg/dL 0.87  0.90  0.81   Sodium 135 - 145 mmol/L 139  140  138   Potassium 3.5 - 5.1 mmol/L 4.2  4.8  4.1   Chloride 98 - 111 mmol/L 103  103  100   CO2 22 - 32 mmol/L 27  30  24   $ Calcium 8.9 - 10.3 mg/dL 9.2  9.4  9.2    [place] Pharmacist Assistant [phone]

## 2023-01-12 ENCOUNTER — Ambulatory Visit (INDEPENDENT_AMBULATORY_CARE_PROVIDER_SITE_OTHER): Payer: Medicare HMO | Admitting: Adult Health

## 2023-01-12 ENCOUNTER — Encounter: Payer: Self-pay | Admitting: Adult Health

## 2023-01-12 VITALS — BP 120/70 | HR 55 | Temp 98.1°F | Ht 66.5 in | Wt 163.0 lb

## 2023-01-12 DIAGNOSIS — R252 Cramp and spasm: Secondary | ICD-10-CM | POA: Diagnosis not present

## 2023-01-12 NOTE — Progress Notes (Signed)
Subjective:    Patient ID: Todd Watson, male    DOB: 1941-05-17, 82 y.o.   MRN: [id number]  Leg Pain    82 year old male who  has a past medical history of Arthritis, GERD (gastroesophageal reflux disease), Herpes zoster, Hiatal hernia, Hypothyroidism, and Osteoarthritis of lumbar spine.  He presents to the office today for an acute issue of leg cramps that he experienced last night. Leg camps woke him from sleep " when I stood up I could barely walk but I made it to the bathroom and took a warm bath and elevated my legs and then cramps stopped". He has not had any cramping since.   He reports that he has not been staying hydrated.    Review of Systems See HPI   Past Medical History:  Diagnosis Date   Arthritis    GERD (gastroesophageal reflux disease)    Herpes zoster    Hiatal hernia    Hypothyroidism    Osteoarthritis of lumbar spine     Social History   Socioeconomic History   Marital status: Married    Spouse name: Not on file   Number of children: 3   Years of education: 10   Highest education level: GED or equivalent  Occupational History   Occupation: retired  Tobacco Use   Smoking status: Never   Smokeless tobacco: Never  Vaping Use   Vaping Use: Never used  Substance and Sexual Activity   Alcohol use: No   Drug use: No   Sexual activity: Yes  Other Topics Concern   Not on file  Social History Narrative   Lives with wife.    3 children     6 grandchildren & 5 great grandchildren   Likes to fish    Social Determinants of Health   Financial Resource Strain: Low Risk  (09/02/2022)   Overall Financial Resource Strain (CARDIA)    Difficulty of Paying Living Expenses: Not very hard  Food Insecurity: No Food Insecurity (07/30/2022)   Hunger Vital Sign    Worried About Running Out of Food in the Last Year: Never true    Ran Out of Food in the Last Year: Never true  Transportation Needs: No Transportation Needs (07/30/2022)   PRAPARE -  Administrator, Civil Service (Medical): No    Lack of Transportation (Non-Medical): No  Physical Activity: Inactive (06/08/2022)   Exercise Vital Sign    Days of Exercise per Week: 0 days    Minutes of Exercise per Session: 0 min  Stress: No Stress Concern Present (06/08/2022)   [employer] of Occupational Health - Occupational Stress Questionnaire    Feeling of Stress : Not at all  Social Connections: Soc[place] (06/02/2021)   Social Connection and Isolation Panel [NHANES]    Frequency of Communication with Friends and Family: Three times a week    Frequency of Social Gatherings with Friends and Family: Three times a week    Attends Religious Services: 1 to 4 times per year    Active Member of Clubs or Organizations: Yes    Attends Banker Meetings: More than 4 times per year    Marital Status: Married  Catering manager Violence: Not At Risk (06/02/2021)   Humiliation, Afraid, Rape, and Kick questionnaire    Fear of Current or Ex-Partner: No    Emotionally Abused: No    Physically Abused: No    Sexually Abused: No    Past Surgical History:  Procedure Laterality Date   APPENDECTOMY  2000   INGUINAL HERNIA REPAIR Bilateral    LUMBAR LAMINECTOMY/DECOMPRESSION MICRODISCECTOMY Right 01/28/2020   Procedure: Microdiscectomy - right - Lumbar four-Lumbar five;  Surgeon: Donalee Citrinram, Gary, MD;  [facility];  Service: Neurosurgery;  Laterality: Right;   SPINE SURGERY     spurs    Family History  Problem Relation Age of Onset   Heart disease Father        Valve disease   Kidney failure Mother     No Known Allergies  Current Outpatient Medications on File Prior to Visit  Medication Sig Dispense Refill   atorvastatin (LIPITOR) 10 MG tablet Take 10 mg by mouth at bedtime.     blood glucose meter kit and supplies KIT Dispense based on patient and insurance preference. Use up to four times daily as directed. 1 each 0   Boswellia-Glucosamine-Vit D (OSTEO  BI-FLEX ONE PER DAY PO) Take 1 tablet by mouth daily.     diclofenac Sodium (VOLTAREN) 1 % GEL Apply 1 application  topically 4 (four) times daily as needed (pain).     gabapentin (NEURONTIN) 100 MG capsule Take 1 capsule (100 mg total) by mouth at bedtime. 30 capsule 1   levothyroxine (SYNTHROID) 88 MCG tablet Take 88 mcg by mouth daily before breakfast.     lisinopril (PRINIVIL,ZESTRIL) 10 MG tablet Take 5 mg by mouth in the morning and at bedtime.      Magnesium 250 MG TABS Take by mouth daily.     meloxicam (MOBIC) 7.5 MG tablet Take 1 tablet daily as needed for elbow pain. 30 tablet 0   metFORMIN (GLUCOPHAGE) 500 MG tablet Take by mouth daily with breakfast.     Multiple Vitamin (MULTIVITAMIN) tablet Take 1 tablet by mouth daily.     Omega-3 1000 MG CAPS Take 1,000 mg by mouth daily.     omeprazole (PRILOSEC) 20 MG capsule Take 20 mg by mouth in the morning and at bedtime.     Probiotic Product (PROBIOTIC DAILY PO) Take 1 capsule by mouth daily.      psyllium (METAMUCIL) 58.6 % powder Take 1 packet by mouth daily as needed (constipation).     sildenafil (VIAGRA) 100 MG tablet Take 100 mg by mouth as needed for erectile dysfunction.     tamsulosin (FLOMAX) 0.4 MG CAPS capsule Take 0.4 mg by mouth at bedtime.     vitamin C (ASCORBIC ACID) 500 MG tablet Take 500 mg by mouth daily.     No current facility-administered medications on file prior to visit.    BP 120/70   Pulse (!) 55   Temp 98.1 F (36.7 C) (Oral)   Ht 5' 6.5" (1.689 m)   Wt 163 lb (73.9 kg)   SpO2 97%   BMI 25.91 kg/m       Objective:   Physical Exam Vitals and nursing note reviewed.  Constitutional:      Appearance: Normal appearance.  Musculoskeletal:        General: Normal range of motion.     Right lower leg: No edema.     Left lower leg: No edema.  Skin:    General: Skin is warm and dry.  Neurological:     General: No focal deficit present.     Mental Status: He is alert and oriented to person, place,  and time.  Psychiatric:        Mood and Affect: Mood normal.  Behavior: Behavior normal.        Thought Content: Thought content normal.        Judgment: Judgment normal.       Assessment & Plan:  1. Leg cramp - Encouraged to drink more water  - Will check potassium level and kidney function  - Basic Metabolic Panel; Future  Shirline Frees, NP

## 2023-01-13 LAB — BASIC METABOLIC PANEL
BUN: 19 mg/dL (ref 6–23)
CO2: 25 mEq/L (ref 19–32)
Calcium: 9 mg/dL (ref 8.4–10.5)
Chloride: 104 mEq/L (ref 96–112)
Creatinine, Ser: 0.93 mg/dL (ref 0.40–1.50)
GFR: 75.58 mL/min (ref >60.00)
Glucose, Bld: 128 mg/dL — ABNORMAL HIGH (ref 70–99)
Potassium: 4.2 mEq/L (ref 3.5–5.1)
Sodium: 140 mEq/L (ref 135–145)

## 2023-01-18 ENCOUNTER — Telehealth: Payer: Self-pay | Admitting: Adult Health

## 2023-01-18 NOTE — Telephone Encounter (Signed)
Pt is calling and would like lab results °

## 2023-01-19 NOTE — Telephone Encounter (Signed)
Called pt back again no answer. Closing note.

## 2023-01-19 NOTE — Telephone Encounter (Signed)
Tried to call pt back no answer. Labs were mailed with results from [facility] on them.

## 2023-02-01 ENCOUNTER — Telehealth: Payer: Self-pay | Admitting: Adult Health

## 2023-02-01 NOTE — Telephone Encounter (Signed)
Pt call and stated he want a call about his labs result.

## 2023-02-02 NOTE — Telephone Encounter (Signed)
Called pt multiple times again no answer.

## 2023-02-14 NOTE — Progress Notes (Unsigned)
Care Management & Coordination Services Pharmacy Note  02/14/2023 Name:  Todd Watson MRN:  [id number] DOB:  1941/08/28  Summary: ***  Recommendations/Changes made from today's visit: ***  Follow up plan: ***   Subjective: Todd Watson is an 82 y.o. year old male who is a primary patient of Todd Frees, NP.  The care coordination team was consulted for assistance with disease management and care coordination needs.    {CCMTELEPHONEFACETOFACE:21091510} for follow up visit.  Recent office visits: 01/12/23 Todd Frees, NP - For leg cramp, no medication changes  09/07/2022 Todd Frees NP - Patient was seen for Type 2 diabetes mellitus with other specified complication, without long-term current use of insulin and additional concerns. No medication changes.   Recent consult visits: 12/21/22 Real Cons ([facility] - For cardiac murmur - no other visit details available  12/09/22 Vanessa [place] ([facility]) - Labs ordered, no other visit details available  Hospital visits: None in previous 6 months   Objective:  Lab Results  Component Value Date   CREATININE 0.93 01/12/2023   BUN 19 01/12/2023   GFR 75.58 01/12/2023   GFRNONAA >60 10/01/2021   GFRAA >60 01/28/2020   NA 140 01/12/2023   K 4.2 01/12/2023   CALCIUM 9.0 01/12/2023   CO2 25 01/12/2023   GLUCOSE 128 (H) 01/12/2023    Lab Results  Component Value Date/Time   HGBA1C 6.1 (A) 09/07/2022 09:04 AM   HGBA1C 6.3 (A) 06/23/2021 09:10 AM   HGBA1C 6.9 (H) 12/11/2020 10:32 AM   HGBA1C 6.9 (H) 01/28/2020 12:23 PM   GFR 75.58 01/12/2023 03:57 PM   GFR 79.78 12/11/2020 10:32 AM   MICROALBUR 1.5 09/07/2022 09:33 AM   MICROALBUR 2.2 (H) 04/12/2017 10:28 AM    Last diabetic Eye exam:  Lab Results  Component Value Date/Time   HMDIABEYEEXA No Retinopathy 02/16/2022 12:00 AM    Last diabetic Foot exam: No results found for: "HMDIABFOOTEX"   Lab Results  Component Value Date   CHOL 144 12/11/2020   HDL 44.70  12/11/2020   LDLCALC 84 12/11/2020   TRIG 76.0 12/11/2020   CHOLHDL 3 12/11/2020       Latest Ref Rng & Units 12/11/2020   10:32 AM 09/29/2019   10:31 AM 09/28/2018   11:03 AM  Hepatic Function  Total Protein 6.0 - 8.3 g/dL 6.9  7.1  6.9   Albumin 3.5 - 5.2 g/dL 4.3  4.0  4.6   AST 0 - 37 U/L 27  26  27    ALT 0 - 53 U/L 28  31  28    Alk Phosphatase 39 - 117 U/L 64  49  50   Total Bilirubin 0.2 - 1.2 mg/dL 0.9  0.7  0.7     Lab Results  Component Value Date/Time   TSH 2.17 12/11/2020 10:32 AM   TSH 2.30 09/28/2018 11:03 AM       Latest Ref Rng & Units 10/01/2021   11:37 PM 12/11/2020   10:32 AM 01/28/2020   12:23 PM  CBC  WBC 4.0 - 10.5 K/uL 8.8  5.1  8.9   Hemoglobin 13.0 - 17.0 g/dL 08.6  57.8  46.9   Hematocrit 39.0 - 52.0 % 40.9  40.8  47.7   Platelets 150 - 400 K/uL 173  185.0  193     No results found for: "VD25OH", "VITAMINB12"  Clinical ASCVD: No  The ASCVD Risk score (Arnett DK, et al., 2019) failed to calculate for the following reasons:  The 2019 ASCVD risk score is only valid for ages 61 to 81       07/30/2022    2:22 PM 06/08/2022   10:13 AM 02/09/2022    7:15 AM  Depression screen PHQ 2/9  Decreased Interest 0 0 0  Down, Depressed, Hopeless 0 0 0  PHQ - 2 Score 0 0 0  Altered sleeping   0  Tired, decreased energy   0  Change in appetite   0  Feeling bad or failure about yourself    0  Trouble concentrating   0  Moving slowly or fidgety/restless   0  Suicidal thoughts   0  PHQ-9 Score   0  Difficult doing work/chores   Not difficult at all     Social History   Tobacco Use  Smoking Status Never  Smokeless Tobacco Never   BP Readings from Last 3 Encounters:  01/12/23 120/70  09/07/22 122/84  06/08/22 (!) 110/58   Pulse Readings from Last 3 Encounters:  01/12/23 (!) 55  09/07/22 (!) 56  06/08/22 (!) 59   Wt Readings from Last 3 Encounters:  01/12/23 163 lb (73.9 kg)  09/07/22 156 lb (70.8 kg)  06/08/22 160 lb 6.4 oz (72.8 kg)    BMI Readings from Last 3 Encounters:  01/12/23 25.91 kg/m  09/07/22 24.80 kg/m  06/08/22 25.50 kg/m    No Known Allergies  Medications Reviewed Today     Reviewed by Sherrin Daisy, CMA (Certified Medical Assistant) on 01/12/23 at 1506  Med List Status: <None>   Medication Order Taking? Sig Documenting Provider Last Dose Status Informant  atorvastatin (LIPITOR) 10 MG tablet [id number] Yes Take 10 mg by mouth at bedtime. [provider] Taking Active Spouse/Significant Other  blood glucose meter kit and supplies KIT [id number] Yes Dispense based on patient and insurance preference. Use up to four times daily as directed. Nafziger, Kandee Keen, NP Taking Active   Boswellia-Glucosamine-Vit D (OSTEO BI-FLEX ONE PER DAY PO) [id number] Yes Take 1 tablet by mouth daily. [provider] Taking Active Spouse/Significant Other  diclofenac Sodium (VOLTAREN) 1 % GEL [id number] Yes Apply 1 application  topically 4 (four) times daily as needed (pain). [provider] Taking Active Spouse/Significant Other  gabapentin (NEURONTIN) 100 MG capsule [id number] Yes Take 1 capsule (100 mg total) by mouth at bedtime. Kathryne Hitch, MD Taking Active   levothyroxine (SYNTHROID) 88 MCG tablet [id number] Yes Take 88 mcg by mouth daily before breakfast. [provider] Taking Active Spouse/Significant Other  lisinopril (PRINIVIL,ZESTRIL) 10 MG tablet [id number] Yes Take 5 mg by mouth in the morning and at bedtime.  [provider] Taking Active Spouse/Significant Other  Magnesium 250 MG TABS [id number] Yes Take by mouth daily. [provider] Taking Active   meloxicam (MOBIC) 7.5 MG tablet [id number] Yes Take 1 tablet daily as needed for elbow pain. Molpus, John, MD Taking Active   metFORMIN (GLUCOPHAGE) 500 MG tablet [id number] Yes Take by mouth daily with breakfast. [provider] Taking Active   Multiple Vitamin (MULTIVITAMIN) tablet [id number] Yes Take 1  tablet by mouth daily. [provider] Taking Active Spouse/Significant Other  Omega-3 1000 MG CAPS [id number] Yes Take 1,000 mg by mouth daily. [provider] Taking Active Spouse/Significant Other  omeprazole (PRILOSEC) 20 MG capsule [id number] Yes Take 20 mg by mouth in the morning and at bedtime. [provider] Taking Active Spouse/Significant Other  Probiotic Product (PROBIOTIC DAILY PO) [id number] Yes Take 1 capsule by mouth daily.  [provider] Taking Active Spouse/Significant Other  psyllium (METAMUCIL) 58.6 % powder [id number] Yes Take 1 packet by mouth daily as needed (constipation). [provider] Taking Active Spouse/Significant Other  sildenafil (VIAGRA) 100 MG tablet [id number] Yes Take 100 mg by mouth as needed for erectile dysfunction. [provider] Taking Active Spouse/Significant Other  tamsulosin (FLOMAX) 0.4 MG CAPS capsule [id number] Yes Take 0.4 mg by mouth at bedtime. [provider] Taking Active Spouse/Significant Other  vitamin C (ASCORBIC ACID) 500 MG tablet [id number] Yes Take 500 mg by mouth daily. [provider] Taking Active Spouse/Significant Other            SDOH:  (Social Determinants of Health) assessments and interventions performed: Yes SDOH Interventions    Flowsheet Row Chronic Care Management from 08/25/2022 in [facility] [place] Telephone from 07/30/2022 in [facility] Coordination Clinical Support from 06/08/2022 in [facility] [place] Chronic Care Management from 08/31/2021 in [facility] [place] Clinical Support from 06/02/2021 in [facility] [place]  SDOH Interventions       Food Insecurity Interventions -- Intervention Not Indicated Intervention Not Indicated -- Intervention Not Indicated  Housing Interventions -- Intervention Not Indicated -- --  Intervention Not Indicated  Transportation Interventions -- Intervention Not Indicated Intervention Not Indicated Intervention Not Indicated Intervention Not Indicated  Utilities Interventions -- Intervention Not Indicated -- -- --  Financial Strain Interventions Intervention Not Indicated -- Intervention Not Indicated Intervention Not Indicated Intervention Not Indicated  Physical Activity Interventions -- -- Patient Refused, Other (Comments) -- Intervention Not Indicated  Stress Interventions -- -- Intervention Not Indicated -- Intervention Not Indicated  Social Connections Interventions -- -- -- -- Intervention Not Indicated       Medication Assistance: {MEDASSISTANCEINFO:25044}  Medication Access: Within the past 30 days, how often has patient missed a dose of medication? *** Is a pillbox or other method used to improve adherence? {YES/NO:21197} Factors that may affect medication adherence? {CHL DESC; BARRIERS:21522} Are meds synced by current pharmacy? {YES/NO:21197} Are meds delivered by current pharmacy? No  Does patient experience delays in picking up medications due to transportation concerns? No   Upstream Services Reviewed: Is patient disadvantaged to use [facility] Pharm[facility]?: Yes  Current Rx insurance plan: A[employer]na Name and location of Current pharmacy:  [facility] [address] [place] - [address] [place] [address] Phone: [phone] Fax: [phone]  [facility] [address], [place] - [id number] [facility] [address] [place] [address] Phone: [phone] Fax: [phone]  [facility] - [place], [place] - [address] [place] [address] Phone: [phone] Fax: [phone]  [facility] Pharm[facility] services reviewed with patient today?: No  Patient requests to transfer care to [facility] Pharmacy?: No  Reason patient declined to change pharmacies: Receives medications through  [place]  Compliance/Adherence/Medication fill history: Care Gaps: Foot exam Covid Booster Tdap  Star-Rating Drugs: Atorvastatin 10mg  --gets through [place] Lisinopril 10mg [facility] Met[facility] 500mg  ---- through [place]   Assessment/Plan Hypertension (BP goal <140/90) -{US controlled/uncontrolled:25276} -Current treatment: Lisinopril 10mg  1 qd -Medications previously tried: None  -Current home readings: *** -Current dietary habits: *** -Current exercise habits: *** -{ACTIONS;DENIES/REPORTS:21021675::"Denies"} hypotensive/hypertensive symptoms -Educated on {CCM BP Counseling:25124} -Counseled to monitor BP at home ***, document, and provide log at future appointments -{CCMPHARMDINTERVENTION:25122}  Diabetes (A1c goal <7%) -Controlled -Current medications: Metformin 500mg  1 qd -Medications previously tried: None  -  Current home glucose readings fasting glucose: *** post prandial glucose: *** -{ACTIONS;DENIES/REPORTS:21021675::"Denies"} hypoglycemic/hyperglycemic symptoms -Current meal patterns:  breakfast: ***  lunch: ***  dinner: *** snacks: *** drinks: *** -Current exercise: *** -Educated on {CCM DM COUNSELING:25123} -Counseled to check feet daily and get yearly eye exams -{CCMPHARMDINTERVENTION:25122}  Sherrill Raring Clinical Pharmacist [phone]

## 2023-02-15 ENCOUNTER — Telehealth: Payer: Self-pay

## 2023-02-15 NOTE — Progress Notes (Signed)
Care Management & Coordination Services Pharmacy Team  Reason for Encounter: Appointment Reminder  Contacted patient to confirm telephone appointment with Delano Metz, PharmD on 02/16/2023 at 2:00. Unsuccessful outreach. Left voicemail for patient to return call.  Do you have any problems getting your medications?   What is your top health concern you would like to discuss at your upcoming visit?   Have you seen any other providers since your last visit with PCP?   Care Gaps: AWV - completed 06/08/2022, scheduled 06/14/2023 Last eye exam - completed 02/16/2022 Last foot exam - completed 02/09/2022 Covid - overdue Tdap - overdue  Star Rating Drug: Atorvastatin 10 mg - no fill history, filled at [place] Lisinopril 10 mg - no fill history, filled at [place] Metformin 500 mg - no fill history, filled at [place]  Inetta Fermo [facility]  Clinical Pharmacist Assistant [phone]

## 2023-02-17 ENCOUNTER — Telehealth: Payer: Self-pay | Admitting: Adult Health

## 2023-02-17 NOTE — Telephone Encounter (Signed)
Pt called and r/s. 

## 2023-02-17 NOTE — Telephone Encounter (Signed)
Pt called to say he had a missed call yesterday from this office.  Pt states he was at the eye doctor, and was unaware he had an appt with the pharmacist.  Pt is asking for a call back to discuss.

## 2023-02-21 NOTE — Progress Notes (Unsigned)
Care Management & Coordination Services Pharmacy Note  02/21/2023 Name:  Todd Watson MRN:  [id number] DOB:  10-29-1940  Summary: ***  Recommendations/Changes made from today's visit: ***  Follow up plan: ***   Subjective: Todd Watson is an 82 y.o. year old male who is a primary patient of Shirline Frees, NP.  The care coordination team was consulted for assistance with disease management and care coordination needs.    {CCMTELEPHONEFACETOFACE:21091510} for follow up visit.  Recent office visits: 01/12/23 Shirline Frees, NP - For leg cramp, no medication changes  09/07/2022 Shirline Frees NP - Patient was seen for Type 2 diabetes mellitus with other specified complication, without long-term current use of insulin and additional concerns. No medication changes.   Recent consult visits: 02/16/23 Meryle Ready ([facility] - Eye exam  12/21/22 Real Cons ([facility]) - For cardiac murmur - no other visit details available  12/09/22 [facility]essa [place] ([facility] - Labs ordered, no other visit details available  Hospital visits: None in previous 6 months   Objective:  Lab Results  Component Value Date   CREATININE 0.93 01/12/2023   BUN 19 01/12/2023   GFR 75.58 01/12/2023   GFRNONAA >60 10/01/2021   GFRAA >60 01/28/2020   NA 140 01/12/2023   K 4.2 01/12/2023   CALCIUM 9.0 01/12/2023   CO2 25 01/12/2023   GLUCOSE 128 (H) 01/12/2023    Lab Results  Component Value Date/Time   HGBA1C 6.1 (A) 09/07/2022 09:04 AM   HGBA1C 6.3 (A) 06/23/2021 09:10 AM   HGBA1C 6.9 (H) 12/11/2020 10:32 AM   HGBA1C 6.9 (H) 01/28/2020 12:23 PM   GFR 75.58 01/12/2023 03:57 PM   GFR 79.78 12/11/2020 10:32 AM   MICROALBUR 1.5 09/07/2022 09:33 AM   MICROALBUR 2.2 (H) 04/12/2017 10:28 AM    Last diabetic Eye exam:  Lab Results  Component Value Date/Time   HMDIABEYEEXA No Retinopathy 02/16/2022 12:00 AM    Last diabetic Foot exam: No results found for: "HMDIABFOOTEX"   Lab Results  Component  Value Date   CHOL 144 12/11/2020   HDL 44.70 12/11/2020   LDLCALC 84 12/11/2020   TRIG 76.0 12/11/2020   CHOLHDL 3 12/11/2020       Latest Ref Rng & Units 12/11/2020   10:32 AM 09/29/2019   10:31 AM 09/28/2018   11:03 AM  Hepatic Function  Total Protein 6.0 - 8.3 g/dL 6.9  7.1  6.9   Albumin 3.5 - 5.2 g/dL 4.3  4.0  4.6   AST 0 - 37 U/L 27  26  27    ALT 0 - 53 U/L 28  31  28    Alk Phosphatase 39 - 117 U/L 64  49  50   Total Bilirubin 0.2 - 1.2 mg/dL 0.9  0.7  0.7     Lab Results  Component Value Date/Time   TSH 2.17 12/11/2020 10:32 AM   TSH 2.30 09/28/2018 11:03 AM       Latest Ref Rng & Units 10/01/2021   11:37 PM 12/11/2020   10:32 AM 01/28/2020   12:23 PM  CBC  WBC 4.0 - 10.5 K/uL 8.8  5.1  8.9   Hemoglobin 13.0 - 17.0 g/dL 40.9  81.1  91.4   Hematocrit 39.0 - 52.0 % 40.9  40.8  47.7   Platelets 150 - 400 K/uL 173  185.0  193     No results found for: "VD25OH", "VITAMINB12"  Clinical ASCVD: No  The ASCVD Risk score (Arnett DK, et al., 2019) failed  to calculate for the following reasons:   The 2019 ASCVD risk score is only valid for ages 60 to 32       07/30/2022    2:22 PM 06/08/2022   10:13 AM 02/09/2022    7:15 AM  Depression screen PHQ 2/9  Decreased Interest 0 0 0  Down, Depressed, Hopeless 0 0 0  PHQ - 2 Score 0 0 0  Altered sleeping   0  Tired, decreased energy   0  Change in appetite   0  Feeling bad or failure about yourself    0  Trouble concentrating   0  Moving slowly or fidgety/restless   0  Suicidal thoughts   0  PHQ-9 Score   0  Difficult doing work/chores   Not difficult at all     Social History   Tobacco Use  Smoking Status Never  Smokeless Tobacco Never   BP Readings from Last 3 Encounters:  01/12/23 120/70  09/07/22 122/84  06/08/22 (!) 110/58   Pulse Readings from Last 3 Encounters:  01/12/23 (!) 55  09/07/22 (!) 56  06/08/22 (!) 59   Wt Readings from Last 3 Encounters:  01/12/23 163 lb (73.9 kg)  09/07/22 156 lb  (70.8 kg)  06/08/22 160 lb 6.4 oz (72.8 kg)   BMI Readings from Last 3 Encounters:  01/12/23 25.91 kg/m  09/07/22 24.80 kg/m  06/08/22 25.50 kg/m    No Known Allergies  Medications Reviewed Today     Reviewed by Sherrin Daisy, CMA (Certified Medical Assistant) on 01/12/23 at 1506  Med List Status: <None>   Medication Order Taking? Sig Documenting Provider Last Dose Status Informant  atorvastatin (LIPITOR) 10 MG tablet [id number] Yes Take 10 mg by mouth at bedtime. [provider] Taking Active Spouse/Significant Other  blood glucose meter kit and supplies KIT [id number] Yes Dispense based on patient and insurance preference. Use up to four times daily as directed. Nafziger, Kandee Keen, NP Taking Active   Boswellia-Glucosamine-Vit D (OSTEO BI-FLEX ONE PER DAY PO) [id number] Yes Take 1 tablet by mouth daily. [provider] Taking Active Spouse/Significant Other  diclofenac Sodium (VOLTAREN) 1 % GEL [id number] Yes Apply 1 application  topically 4 (four) times daily as needed (pain). [provider] Taking Active Spouse/Significant Other  gabapentin (NEURONTIN) 100 MG capsule [id number] Yes Take 1 capsule (100 mg total) by mouth at bedtime. Kathryne Hitch, MD Taking Active   levothyroxine (SYNTHROID) 88 MCG tablet [id number] Yes Take 88 mcg by mouth daily before breakfast. [provider] Taking Active Spouse/Significant Other  lisinopril (PRINIVIL,ZESTRIL) 10 MG tablet [id number] Yes Take 5 mg by mouth in the morning and at bedtime.  [provider] Taking Active Spouse/Significant Other  Magnesium 250 MG TABS [id number] Yes Take by mouth daily. [provider] Taking Active   meloxicam (MOBIC) 7.5 MG tablet [id number] Yes Take 1 tablet daily as needed for elbow pain. Molpus, John, MD Taking Active   metFORMIN (GLUCOPHAGE) 500 MG tablet [id number] Yes Take by mouth daily with breakfast. [provider] Taking Active   Multiple  Vitamin (MULTIVITAMIN) tablet [id number] Yes Take 1 tablet by mouth daily. [provider] Taking Active Spouse/Significant Other  Omega-3 1000 MG CAPS [id number] Yes Take 1,000 mg by mouth daily. [provider] Taking Active Spouse/Significant Other  omeprazole (PRILOSEC) 20 MG capsule [id number] Yes Take 20 mg by mouth in the morning and at bedtime. [provider] Taking Active Spouse/Significant Other  Probiotic Product (PROBIOTIC DAILY PO) [id number]  Yes Take 1 capsule by mouth daily.  [provider] Taking Active Spouse/Significant Other  psyllium (METAMUCIL) 58.6 % powder [id number] Yes Take 1 packet by mouth daily as needed (constipation). [provider] Taking Active Spouse/Significant Other  sildenafil (VIAGRA) 100 MG tablet [id number] Yes Take 100 mg by mouth as needed for erectile dysfunction. [provider] Taking Active Spouse/Significant Other  tamsulosin (FLOMAX) 0.4 MG CAPS capsule [id number] Yes Take 0.4 mg by mouth at bedtime. [provider] Taking Active Spouse/Significant Other  vitamin C (ASCORBIC ACID) 500 MG tablet [id number] Yes Take 500 mg by mouth daily. [provider] Taking Active Spouse/Significant Other            SDOH:  (Social Determinants of Health) assessments and interventions performed: Yes SDOH Interventions    Flowsheet Row Chronic Care Management from 08/25/2022 in [facility] [place] Telephone from 07/30/2022 in [facility] Care Coordination Clinical Support from 06/08/2022 in [facility] [place] Chronic Care Management from 08/31/2021 in [facility] [place] Clinical Support from 06/02/2021 in [facility] [place]  SDOH Interventions       Food Insecurity Interventions -- Intervention Not Indicated Intervention Not Indicated -- Intervention Not Indicated  Housing  Interventions -- Intervention Not Indicated -- -- Intervention Not Indicated  Transportation Interventions -- Intervention Not Indicated Intervention Not Indicated Intervention Not Indicated Intervention Not Indicated  Utilities Interventions -- Intervention Not Indicated -- -- --  Financial Strain Interventions Intervention Not Indicated -- Intervention Not Indicated Intervention Not Indicated Intervention Not Indicated  Physical Activity Interventions -- -- Patient Refused, Other (Comments) -- Intervention Not Indicated  Stress Interventions -- -- Intervention Not Indicated -- Intervention Not Indicated  Social Connections Interventions -- -- -- -- Intervention Not Indicated       Medication Assistance: {MEDASSISTANCEINFO:25044}  Medication Access: Within the past 30 days, how often has patient missed a dose of medication? *** Is a pillbox or other method used to improve adherence? {YES/NO:21197} Factors that may affect medication adherence? {CHL DESC; BARRIERS:21522} Are meds synced by current pharmacy? {YES/NO:21197} Are meds delivered by current pharmacy? No  Does patient experience delays in picking up medications due to transportation concerns? No   Upstream Services Reviewed: Is patient disadvantaged to use [facility] Pharm[facility]?: Yes  Current Rx insurance plan: A[employer]na Name and location of Current pharmacy:  [facility] [address] [place] - [address] [place] [address] Phone: [phone] Fax: [phone]  [facility] [address], [place] - [phone] [facility] [address] [place] [address] Phone: [phone] Fax: [phone]  [facility] - [place], [place] - [address] [place] [address] Phone: [phone] Fax: [phone]  [facility] Pharm[facility] services reviewed with patient today?: No  Patient requests to transfer care to [facility] Pharmacy?: No  Reason patient declined  to change pharmacies: Receives medications through [place]  Compliance/Adherence/Medication fill history: Care Gaps: Foot exam Covid Booster Tdap  Star-Rating Drugs: Atorvastatin 10mg  --gets through [place] Lisinopril 10mg [facility] [place] 500mg  ---- through [place]   Assessment/Plan Hypertension (BP goal <140/90) -{US controlled/uncontrolled:25276} -Current treatment: Lisinopril 10mg  1 qd -Medications previously tried: None  -Current home readings: *** -Current dietary habits: *** -Current exercise habits: *** -{ACTIONS;DENIES/REPORTS:21021675::"Denies"} hypotensive/hypertensive symptoms -Educated on {CCM BP Counseling:25124} -Counseled to monitor BP at home ***, document, and provide log at future appointments -{CCMPHARMDINTERVENTION:25122}  Diabetes (A1c goal <7%) -Controlled -Current medications: Metformin  500mg  1 qd -Medications previously tried: None  -Current home glucose readings fasting glucose: *** post prandial glucose: *** -{ACTIONS;DENIES/REPORTS:21021675::"Denies"} hypoglycemic/hyperglycemic symptoms -Current meal patterns:  breakfast: ***  lunch: ***  dinner: *** snacks: *** drinks: *** -Current exercise: *** -Educated on {CCM DM COUNSELING:25123} -Counseled to check feet daily and get yearly eye exams -{CCMPHARMDINTERVENTION:25122}  Sherrill Raring Clinical Pharmacist [phone]

## 2023-02-22 DIAGNOSIS — Z008 Encounter for other general examination: Secondary | ICD-10-CM | POA: Diagnosis not present

## 2023-03-02 ENCOUNTER — Telehealth: Payer: Self-pay

## 2023-03-02 NOTE — Progress Notes (Signed)
Care Management & Coordination Services Pharmacy Team  Reason for Encounter: General adherence update   Contacted patient for general health update and medication adherence call.  Unsuccessful outreach. Left voicemail for patient to return call.   What concerns do you have about your medications?  The patient  side effects with their medications.   How often do you forget or accidentally miss a dose?   Do you use a pillbox?   Are you having any problems getting your medications from your pharmacy?   Has the cost of your medications been a concern?   Since last visit with PharmD, no interventions have been made.   The patient has not had an ED visit since last contact.   The patient  problems with their health.   Patient  concerns or questions for , PharmD at this time.   Care Gaps: AWV - completed 06/08/2022 Last eye exam - completed 02/16/2022 Last foot exam - completed 02/09/2022 Last BP - 120/70 on 01/12/2023 Last A1C - 6.1 on 09/07/2022 Covid - overdue Tdap - overdue   Star Rating Drug: Atorvastatin 10 mg - no fill history, filled at [place] Lisinopril 10 mg - no fill history, filled at [place] Metformin 500 mg - no fill history, filled at [place]  Chart Updates:  Recent office visits:  None  Recent consult visits:  02/16/2023 [facility] [employer] [facility] - Patient was seen for Type 2 diabetes mellitus without complications and additional concerns.   Active Outpatient Medications Status 1) ATORVASTATIN CALCIUM 10MG  TAB TAKE ONE TABLET BY ACTIVE (S) MOUTH AT BEDTIME FOR CHOLESTEROL 2) LEVOTHYROXINE NA (SYNTHROID) TAB TAKE ONE ACTIVE TABLET BY MOUTH DAILY 3) LISINOPRIL 10MG  TAB TAKE ONE-HALF TABLET BY MOUTH ACTIVE (S) TWICE A DAY FOR HEART 4) METFORMIN HCL 500MG  24HR SA TAB TAKE ONE TABLET BY ACTIVE MOUTH DAILY FOR DIABETES (ANNUAL KIDNEY FUNCTION TESTING IS NEEDED) 5) OMEPRAZOLE 20MG  EC CAP TAKE ONE CAPSULE BY MOUTH ACTIVE TWICE A DAY  (TAKE ON AN EMPTY STOMACH 30 MINUTES PRIOR TO A MEAL) 6) SILDENAFIL CITRATE 100MG  TAB TAKE ONE TABLET BY MOUTH ACTIVE AS INSTRUCTED FOR ED (TAKE 1 HOUR PRIOR TO SEXUAL ACTIVITY *DO NOT EXCEED 1 DOSE PER 24 HOUR PERIOD*) 7) TAMSULOSIN HCL 0.4MG  CAP TAKE ONE CAPSULE BY MOUTH AT ACTIVE (S) BEDTIME *DO NOT TAKE ON THE SAME NIGHT AS   Active Non-VA Medications Status  1) Non-VA ASCORBIC ACID 500MG  TAB 1000MG  MOUTH EVERY DAY ACTIVE 2) Non-VA ASPIRIN 81MG  EC TAB 81MG  MOUTH EVERY DAY ACTIVE 3) Non-VA CHONDROITIN/GLUCOSAMINE CAP/TAB 1 CAP/TAB ACTIVE MOUTH EVERY DAY 4) Non-VA METHOCARBAMOL 500MG  TAB 500MG  MOUTH FOUR TIMES ACTIVE A DAY 5) Non-VA MULTIVITAMIN/MINERALS CAP/TAB 1 TABLET MOUTH ACTIVE EVERY DAY 6) Non-VA zzPROBIOTIC COMBINATION CAP/TAB 1 CAPSULE ACTIVE MOUTH EVERY DAY   Hospital visits:  None  Medications: Outpatient Encounter Medications as of 03/02/2023  Medication Sig   atorvastatin (LIPITOR) 10 MG tablet Take 10 mg by mouth at bedtime.   blood glucose meter kit and supplies KIT Dispense based on patient and insurance preference. Use up to four times daily as directed.   Boswellia-Glucosamine-Vit D (OSTEO BI-FLEX ONE PER DAY PO) Take 1 tablet by mouth daily.   diclofenac Sodium (VOLTAREN) 1 % GEL Apply 1 application  topically 4 (four) times daily as needed (pain).   gabapentin (NEURONTIN) 100 MG capsule Take 1 capsule (100 mg total) by mouth at bedtime.   levothyroxine (SYNTHROID) 88 MCG tablet Take 88 mcg by mouth daily  before breakfast.   lisinopril (PRINIVIL,ZESTRIL) 10 MG tablet Take 5 mg by mouth in the morning and at bedtime.    Magnesium 250 MG TABS Take by mouth daily.   meloxicam (MOBIC) 7.5 MG tablet Take 1 tablet daily as needed for elbow pain.   metFORMIN (GLUCOPHAGE) 500 MG tablet Take by mouth daily with breakfast.   Multiple Vitamin (MULTIVITAMIN) tablet Take 1 tablet by mouth daily.   Omega-3 1000 MG CAPS Take 1,000 mg by mouth daily.   omeprazole (PRILOSEC)  20 MG capsule Take 20 mg by mouth in the morning and at bedtime.   Probiotic Product (PROBIOTIC DAILY PO) Take 1 capsule by mouth daily.    psyllium (METAMUCIL) 58.6 % powder Take 1 packet by mouth daily as needed (constipation).   tamsulosin (FLOMAX) 0.4 MG CAPS capsule Take 0.4 mg by mouth at bedtime.   vitamin C (ASCORBIC ACID) 500 MG tablet Take 500 mg by mouth daily.   No facility-administered encounter medications on file as of 03/02/2023.  Fill History:  Dispensed Days Supply Quantity Provider Pharmacy  GABAPENTIN 100MG     CAP 04/21/2022 30 30 each      Dispensed Days Supply Quantity Provider Pharmacy  MELOXICAM 7.5MG      TAB 03/19/2022 30 30 each      Dispensed Days Supply Quantity Provider Pharmacy  TAMSULOSIN HYDROCHLORIDE 0.4MG  CAP 03/12/2021 90 90 capsule     Recent vitals BP Readings from Last 3 Encounters:  01/12/23 120/70  09/07/22 122/84  06/08/22 (!) 110/58   Pulse Readings from Last 3 Encounters:  01/12/23 (!) 55  09/07/22 (!) 56  06/08/22 (!) 59   Wt Readings from Last 3 Encounters:  01/12/23 163 lb (73.9 kg)  09/07/22 156 lb (70.8 kg)  06/08/22 160 lb 6.4 oz (72.8 kg)   BMI Readings from Last 3 Encounters:  01/12/23 25.91 kg/m  09/07/22 24.80 kg/m  06/08/22 25.50 kg/m    Recent lab results    Component Value Date/Time   NA 140 01/12/2023 1557   K 4.2 01/12/2023 1557   CL 104 01/12/2023 1557   CO2 25 01/12/2023 1557   GLUCOSE 128 (H) 01/12/2023 1557   BUN 19 01/12/2023 1557   CREATININE 0.93 01/12/2023 1557   CALCIUM 9.0 01/12/2023 1557    Lab Results  Component Value Date   CREATININE 0.93 01/12/2023   GFR 75.58 01/12/2023   GFRNONAA >60 10/01/2021   GFRAA >60 01/28/2020   Lab Results  Component Value Date/Time   HGBA1C 6.1 (A) 09/07/2022 09:04 AM   HGBA1C 6.3 (A) 06/23/2021 09:10 AM   HGBA1C 6.9 (H) 12/11/2020 10:32 AM   HGBA1C 6.9 (H) 01/28/2020 12:23 PM   MICROALBUR 1.5 09/07/2022 09:33 AM   MICROALBUR 2.2 (H) 04/12/2017 10:28  AM    Lab Results  Component Value Date   CHOL 144 12/11/2020   HDL 44.70 12/11/2020   LDLCALC 84 12/11/2020   TRIG 76.0 12/11/2020   CHOLHDL 3 12/11/2020    Inetta Fermo CMA  Clinical Pharmacist Assistant [phone]

## 2023-04-28 ENCOUNTER — Ambulatory Visit (INDEPENDENT_AMBULATORY_CARE_PROVIDER_SITE_OTHER): Payer: Medicare HMO

## 2023-04-28 ENCOUNTER — Telehealth: Payer: Medicare HMO | Admitting: Family Medicine

## 2023-04-28 ENCOUNTER — Encounter: Payer: Self-pay | Admitting: Family Medicine

## 2023-04-28 DIAGNOSIS — U071 COVID-19: Secondary | ICD-10-CM | POA: Diagnosis not present

## 2023-04-28 DIAGNOSIS — R051 Acute cough: Secondary | ICD-10-CM | POA: Diagnosis not present

## 2023-04-28 DIAGNOSIS — R059 Cough, unspecified: Secondary | ICD-10-CM | POA: Diagnosis not present

## 2023-04-28 LAB — POC COVID19 BINAXNOW: SARS Coronavirus 2 Ag: POSITIVE — AB

## 2023-04-28 MED ORDER — BENZONATATE 100 MG PO CAPS: 100.0000 mg | ORAL_CAPSULE | Freq: Two times a day (BID) | ORAL | 0 refills | Status: DC | PRN

## 2023-04-28 MED ORDER — MOLNUPIRAVIR EUA 200MG CAPSULE: 4.0000 | ORAL_CAPSULE | Freq: Two times a day (BID) | ORAL | 0 refills | Status: AC

## 2023-04-28 NOTE — Progress Notes (Signed)
Virtual Visit via Video Note  I connected with Todd Watson on 04/28/23 at  4:30 PM EDT by a video enabled telemedicine application and verified that I am speaking with the correct person using two identifiers.  Location patient: home Location provider:work or home office Persons participating in the virtual visit: patient, provider  I discussed the limitations of evaluation and management by telemedicine and the availability of in person appointments. The patient expressed understanding and agreed to proceed. Chief Complaint  Patient presents with   Covid Positive    Pt reports he got exposed to daughter who had covid about wk ago. Pt reports cough, coughing up clear phlegm. Started 4-5 days ago. Been feeling a lot cold. Currently taking allergy med.      HPI: Pt is an 82 yo male followed by Shirline Frees, NP and seen for acute concern. Pt with a slightly productive cough, chills. Pt denies fever, ST, HA, nausea, vomiting, diarrhea. Appetite is good. Pt tried allergy medicine but can't recall the name. Pt states he may have gotten sick from his daughter who was recently sick with COVID. Pt had several COVID vaccines. COVID test positive today in clinic.    ROS: See pertinent positives and negatives per HPI.  Past Medical History:  Diagnosis Date   Arthritis    GERD (gastroesophageal reflux disease)    Herpes zoster    Hiatal hernia    Hypothyroidism    Osteoarthritis of lumbar spine     Past Surgical History:  Procedure Laterality Date   APPENDECTOMY  2000   INGUINAL HERNIA REPAIR Bilateral    LUMBAR LAMINECTOMY/DECOMPRESSION MICRODISCECTOMY Right 01/28/2020   Procedure: Microdiscectomy - right - Lumbar four-Lumbar five;  Surgeon: Donalee Citrin, MD;  Location: [facility] OR;  Service: Neurosurgery;  Laterality: Right;   SPINE SURGERY     spurs    Family History  Problem Relation Age of Onset   Heart disease Father        Valve disease   Kidney failure Mother     Current  Outpatient Medications:    atorvastatin (LIPITOR) 10 MG tablet, Take 10 mg by mouth at bedtime., Disp: , Rfl:    blood glucose meter kit and supplies KIT, Dispense based on patient and insurance preference. Use up to four times daily as directed., Disp: 1 each, Rfl: 0   Boswellia-Glucosamine-Vit D (OSTEO BI-FLEX ONE PER DAY PO), Take 1 tablet by mouth daily., Disp: , Rfl:    diclofenac Sodium (VOLTAREN) 1 % GEL, Apply 1 application  topically 4 (four) times daily as needed (pain)., Disp: , Rfl:    gabapentin (NEURONTIN) 100 MG capsule, Take 1 capsule (100 mg total) by mouth at bedtime., Disp: 30 capsule, Rfl: 1   levothyroxine (SYNTHROID) 88 MCG tablet, Take 88 mcg by mouth daily before breakfast., Disp: , Rfl:    lisinopril (PRINIVIL,ZESTRIL) 10 MG tablet, Take 5 mg by mouth in the morning and at bedtime. , Disp: , Rfl:    Magnesium 250 MG TABS, Take by mouth daily., Disp: , Rfl:    meloxicam (MOBIC) 7.5 MG tablet, Take 1 tablet daily as needed for elbow pain., Disp: 30 tablet, Rfl: 0   metFORMIN (GLUCOPHAGE) 500 MG tablet, Take by mouth daily with breakfast., Disp: , Rfl:    Multiple Vitamin (MULTIVITAMIN) tablet, Take 1 tablet by mouth daily., Disp: , Rfl:    Omega-3 1000 MG CAPS, Take 1,000 mg by mouth daily., Disp: , Rfl:    omeprazole (PRILOSEC) 20 MG capsule,  Take 20 mg by mouth in the morning and at bedtime., Disp: , Rfl:    Probiotic Product (PROBIOTIC DAILY PO), Take 1 capsule by mouth daily. , Disp: , Rfl:    psyllium (METAMUCIL) 58.6 % powder, Take 1 packet by mouth daily as needed (constipation)., Disp: , Rfl:    tamsulosin (FLOMAX) 0.4 MG CAPS capsule, Take 0.4 mg by mouth at bedtime., Disp: , Rfl:    vitamin C (ASCORBIC ACID) 500 MG tablet, Take 500 mg by mouth daily., Disp: , Rfl:   EXAM:  VITALS per patient if applicable: RR between 12-20 bpm  GENERAL: alert, oriented, appears well and in no acute distress  HEENT: atraumatic, conjunctiva clear, no obvious abnormalities on  inspection of external nose and ears  NECK: normal movements of the head and neck  LUNGS: on inspection no signs of respiratory distress, breathing rate appears normal, no obvious gross SOB, gasping or wheezing  CV: no obvious cyanosis  MS: moves all visible extremities without noticeable abnormality  PSYCH/NEURO: pleasant and cooperative, no obvious depression or anxiety, speech and thought processing grossly intact  ASSESSMENT AND PLAN:  Discussed the following assessment and plan:  COVID-19 virus infection - Plan: molnupiravir EUA (LAGEVRIO) 200 mg CAPS capsule  Acute cough - Plan: benzonatate (TESSALON) 100 MG capsule  Acute viral symptoms started 4-5 days ago with positive COVID test today in clinic.  Discussed r/b/a of antiviral medications.  Patient wishes to start.  Rx for molnupiravir sent to pharmacy.  Patient advised if cost prohibitive leave medication at pharmacy.  Rx for Tessalon also sent to pharmacy.  Okay to continue OTC cough/cold medications, rest, hydration, etc.  Given strict ED precautions.  Follow-up as needed   I discussed the assessment and treatment plan with the patient. The patient was provided an opportunity to ask questions and all were answered. The patient agreed with the plan and demonstrated an understanding of the instructions.   The patient was advised to call back or seek an in-person evaluation if the symptoms worsen or if the condition fails to improve as anticipated.   Deeann Saint, MD

## 2023-05-02 ENCOUNTER — Ambulatory Visit: Payer: Medicare HMO | Admitting: Dermatology

## 2023-05-19 ENCOUNTER — Encounter (INDEPENDENT_AMBULATORY_CARE_PROVIDER_SITE_OTHER): Payer: Self-pay

## 2023-06-17 ENCOUNTER — Ambulatory Visit (INDEPENDENT_AMBULATORY_CARE_PROVIDER_SITE_OTHER): Payer: Medicare HMO

## 2023-06-17 VITALS — BP 118/62 | HR 63 | Temp 98.3°F | Ht 66.5 in | Wt 160.1 lb

## 2023-06-17 DIAGNOSIS — Z0000 Encounter for general adult medical examination without abnormal findings: Secondary | ICD-10-CM

## 2023-06-17 NOTE — Patient Instructions (Addendum)
Mr. Nonaka , Thank you for taking time to come for your Medicare Wellness Visit. I appreciate your ongoing commitment to your health goals. Please review the following plan we discussed and let me know if I can assist you in the future.   Referrals/Orders/Follow-Ups/Clinician Recommendations:   This is a list of the screening recommended for you and due dates:  Health Maintenance  Topic Date Due   DTaP/Tdap/Td vaccine (1 - Tdap) 07/17/2022   Complete foot exam   02/10/2023   Eye exam for diabetics  02/17/2023   Hemoglobin A1C  03/09/2023   Flu Shot  05/05/2023   COVID-19 Vaccine (7 - 2023-24 season) 06/05/2023   Yearly kidney health urinalysis for diabetes  09/08/2023   Yearly kidney function blood test for diabetes  01/12/2024   Medicare Annual Wellness Visit  06/16/2024   Pneumonia Vaccine  Completed   Zoster (Shingles) Vaccine  Completed   HPV Vaccine  Aged Out    Advanced directives: (Declined) Advance directive discussed with you today. Even though you declined this today, please call our office should you change your mind, and we can give you the proper paperwork for you to fill out.  Next Medicare Annual Wellness Visit scheduled for next year: Yes

## 2023-06-17 NOTE — Progress Notes (Signed)
Subjective:   Todd Watson is a 82 y.o. male who presents for Medicare Annual/Subsequent preventive examination.  Visit Complete: Virtual  I connected with  Todd Watson on 06/17/23 by a audio enabled telemedicine application and verified that I am speaking with the correct person using two identifiers.  Patient Location: Home  Provider Location: Home Office  I discussed the limitations of evaluation and management by telemedicine. The patient expressed understanding and agreed to proceed.     Cardiac Risk Factors include: advanced age (>58men, >89 women);male gender;hypertension;diabetes mellitus     Objective:    Today's Vitals   06/17/23 0944  BP: 118/62  Pulse: 63  Temp: 98.3 F (36.8 C)  TempSrc: Oral  SpO2: 93%  Weight: 160 lb 1.3 oz (72.6 kg)  Height: 5' 6.5" (1.689 m)   Body mass index is 25.45 kg/m.     06/17/2023   10:02 AM 06/08/2022   10:10 AM 10/01/2021    7:54 PM 06/02/2021   10:37 AM 10/12/2019   12:28 PM 09/29/2019    9:27 AM 09/04/2018    8:41 AM  Advanced Directives  Does Patient Have a Medical Advance Directive? No No No No No No No  Would patient like information on creating a medical advance directive? No - Patient declined No - Patient declined  No - Patient declined No - Patient declined Yes (ED - Information included in AVS)     Current Medications (verified) Outpatient Encounter Medications as of 06/17/2023  Medication Sig   atorvastatin (LIPITOR) 10 MG tablet Take 10 mg by mouth at bedtime.   benzonatate (TESSALON) 100 MG capsule Take 1 capsule (100 mg total) by mouth 2 (two) times daily as needed for cough.   blood glucose meter kit and supplies KIT Dispense based on patient and insurance preference. Use up to four times daily as directed.   Boswellia-Glucosamine-Vit D (OSTEO BI-FLEX ONE PER DAY PO) Take 1 tablet by mouth daily.   diclofenac Sodium (VOLTAREN) 1 % GEL Apply 1 application  topically 4 (four) times daily as needed (pain).    gabapentin (NEURONTIN) 100 MG capsule Take 1 capsule (100 mg total) by mouth at bedtime.   levothyroxine (SYNTHROID) 88 MCG tablet Take 88 mcg by mouth daily before breakfast.   lisinopril (PRINIVIL,ZESTRIL) 10 MG tablet Take 5 mg by mouth in the morning and at bedtime.    Magnesium 250 MG TABS Take by mouth daily.   meloxicam (MOBIC) 7.5 MG tablet Take 1 tablet daily as needed for elbow pain.   metFORMIN (GLUCOPHAGE) 500 MG tablet Take by mouth daily with breakfast.   Multiple Vitamin (MULTIVITAMIN) tablet Take 1 tablet by mouth daily.   Omega-3 1000 MG CAPS Take 1,000 mg by mouth daily.   omeprazole (PRILOSEC) 20 MG capsule Take 20 mg by mouth in the morning and at bedtime.   Probiotic Product (PROBIOTIC DAILY PO) Take 1 capsule by mouth daily.    psyllium (METAMUCIL) 58.6 % powder Take 1 packet by mouth daily as needed (constipation).   tamsulosin (FLOMAX) 0.4 MG CAPS capsule Take 0.4 mg by mouth at bedtime.   vitamin C (ASCORBIC ACID) 500 MG tablet Take 500 mg by mouth daily.   No facility-administered encounter medications on file as of 06/17/2023.    Allergies (verified) Patient has no known allergies.   History: Past Medical History:  Diagnosis Date   Arthritis    GERD (gastroesophageal reflux disease)    Herpes zoster    Hiatal hernia  Hypothyroidism    Osteoarthritis of lumbar spine    Past Surgical History:  Procedure Laterality Date   APPENDECTOMY  2000   INGUINAL HERNIA REPAIR Bilateral    LUMBAR LAMINECTOMY/DECOMPRESSION MICRODISCECTOMY Right 01/28/2020   Procedure: Microdiscectomy - right - Lumbar four-Lumbar five;  Surgeon: Donalee Citrin, MD;  Location: [facility] OR;  Service: Neurosurgery;  Laterality: Right;   SPINE SURGERY     spurs   Family History  Problem Relation Age of Onset   Heart disease Father        Valve disease   Kidney failure Mother    Social History   Socioeconomic History   Marital status: Married    Spouse name: Not on file   Number of  children: 3   Years of education: 10   Highest education level: GED or equivalent  Occupational History   Occupation: retired  Tobacco Use   Smoking status: Never   Smokeless tobacco: Never  Vaping Use   Vaping status: Never Used  Substance and Sexual Activity   Alcohol use: No   Drug use: No   Sexual activity: Yes  Other Topics Concern   Not on file  Social History Narrative   Lives with wife.    3 children     6 grandchildren & 5 great grandchildren   Likes to fish    Social Determinants of Health   Financial Resource Strain: Low Risk  (06/17/2023)   Overall Financial Resource Strain (CARDIA)    Difficulty of Paying Living Expenses: Not hard at all  Food Insecurity: No Food Insecurity (06/17/2023)   Hunger Vital Sign    Worried About Running Out of Food in the Last Year: Never true    Ran Out of Food in the Last Year: Never true  Transportation Needs: No Transportation Needs (06/17/2023)   PRAPARE - Administrator, Civil Service (Medical): No    Lack of Transportation (Non-Medical): No  Physical Activity: Inactive (06/17/2023)   Exercise Vital Sign    Days of Exercise per Week: 0 days    Minutes of Exercise per Session: 0 min  Stress: No Stress Concern Present (06/17/2023)   [employer] of Occupational Health - Occupational Stress Questionnaire    Feeling of Stress : Not at all  Social Connections: Socially Integrated (06/17/2023)   Social Connection and Isolation Panel [NHANES]    Frequency of Communication with Friends and Family: More than three times a week    Frequency of Social Gatherings with Friends and Family: More than three times a week    Attends Religious Services: More than 4 times per year    Active Member of [employer] or Organizations: Yes    Attends Engineer, structural: More than 4 times per year    Marital Status: Married    Tobacco Counseling Counseling given: Not Answered   Clinical Intake:  Pre-visit preparation  completed: Yes  Pain : No/denies pain     BMI - recorded: 25.45 Nutritional Status: BMI 25 -29 Overweight Nutritional Risks: None Diabetes: Yes CBG done?: No Did pt. bring in CBG monitor from home?: No  How often do you need to have someone help you when you read instructions, pamphlets, or other written materials from your doctor or pharmacy?: 1 - Never  Interpreter Needed?: No  Information entered by :: Theresa Mulligan LPN   Activities of Daily Living    06/17/2023   10:01 AM  In your present state of health, do you have any  difficulty performing the following activities:  Hearing? 1  Comment Wears hearing aids  Vision? 0  Difficulty concentrating or making decisions? 0  Walking or climbing stairs? 0  Dressing or bathing? 0  Doing errands, shopping? 0  Preparing Food and eating ? N  Using the Toilet? N  In the past six months, have you accidently leaked urine? N  Do you have problems with loss of bowel control? N  Managing your Medications? N  Managing your Finances? N  Housekeeping or managing your Housekeeping? N    Patient Care Team: Shirline Frees, NP as PCP - General (Family Medicine) Donalee Citrin, MD as Consulting Physician (Neurosurgery) Verner Chol, [facility] (Inactive) as Pharmacist (Pharmacist)  Indicate any recent Medical Services you may have received from other than [facility] providers in the past year (date may be approximate).     Assessment:   This is a routine wellness examination for Todd Watson.  Hearing/Vision screen Hearing Screening - Comments:: Wears hearing aids Vision Screening - Comments:: Wears rx glasses - up to date with routine eye exams with  [facility]   Goals Addressed               This Visit's Progress     Patient Stated (pt-stated)        Keep wife happy and stay healthy and active!        Depression Screen    06/17/2023    9:44 AM 07/30/2022    2:22 PM 06/08/2022   10:13 AM 02/09/2022    7:15 AM 10/02/2021     1:46 PM 06/02/2021   10:35 AM 09/04/2018    8:47 AM  PHQ 2/9 Scores  PHQ - 2 Score 0 0 0 0 0 0 0  PHQ- 9 Score    0 2      Fall Risk    06/17/2023   10:01 AM 04/28/2023    4:23 PM 06/08/2022   10:13 AM 02/09/2022    7:16 AM 06/02/2021   10:38 AM  Fall Risk   Falls in the past year? 0 0 0 0 0  Number falls in past yr: 0 0 0 0 0  Injury with Fall? 0 0 0 0 0  Risk for fall due to : No Fall Risks No Fall Risks Medication side effect  No Fall Risks  Follow up Falls prevention discussed Falls evaluation completed Falls prevention discussed;Education provided;Falls evaluation completed  Falls evaluation completed    MEDICARE RISK AT HOME: Medicare Risk at Home Any stairs in or around the home?: Yes If so, are there any without handrails?: No Home free of loose throw rugs in walkways, pet beds, electrical cords, etc?: Yes Adequate lighting in your home to reduce risk of falls?: Yes Life alert?: No Use of a cane, walker or w/c?: No Grab bars in the bathroom?: Yes Shower chair or bench in shower?: Yes Elevated toilet seat or a handicapped toilet?: Yes  TIMED UP AND GO:  Was the test performed?  Yes  Length of time to ambulate 10 feet: 10 sec Gait slow and steady without use of assistive device    Cognitive Function:    09/04/2018    8:53 AM  MMSE - Mini Mental State Exam  Not completed: --        06/17/2023   10:02 AM 06/08/2022   10:16 AM  6CIT Screen  What Year? 0 points 0 points  What month? 0 points 0 points  What time? 0 points  0 points  Count back from 20 0 points 4 points  Months in reverse 4 points 4 points  Repeat phrase 0 points 4 points  Total Score 4 points 12 points    Immunizations Immunization History  Administered Date(s) Administered   Fluad Quad(high Dose 65+) 06/27/2020, 06/23/2021, 09/07/2022   Influenza, High Dose Seasonal PF 07/31/2015, 07/04/2017   Influenza,inj,Quad PF,6+ Mos 07/04/2014, 08/05/2019   Influenza-Unspecified 08/21/2018   Moderna  Sars-Covid-2 Vaccination 02/04/2020, 03/03/2020, 11/06/2020   PNEUMOCOCCAL CONJUGATE-20 06/10/2021   Pneumococcal Conjugate-13 07/04/2014   Td (Adult),unspecified 07/16/2022   Zoster Recombinant(Shingrix) 04/03/2017, 07/04/2017    TDAP status: Due, Education has been provided regarding the importance of this vaccine. Advised may receive this vaccine at local pharmacy or Health Dept. Aware to provide a copy of the vaccination record if obtained from local pharmacy or Health Dept. Verbalized acceptance and understanding.  Flu Vaccine status: Due, Education has been provided regarding the importance of this vaccine. Advised may receive this vaccine at local pharmacy or Health Dept. Aware to provide a copy of the vaccination record if obtained from local pharmacy or Health Dept. Verbalized acceptance and understanding.  Pneumococcal vaccine status: Up to date  Covid-19 vaccine status: Declined, Education has been provided regarding the importance of this vaccine but patient still declined. Advised may receive this vaccine at local pharmacy or Health Dept.or vaccine clinic. Aware to provide a copy of the vaccination record if obtained from local pharmacy or Health Dept. Verbalized acceptance and understanding.  Qualifies for Shingles Vaccine? Yes   Zostavax completed Yes   Shingrix Completed?: Yes  Screening Tests Health Maintenance  Topic Date Due   DTaP/Tdap/Td (1 - Tdap) 07/17/2022   FOOT EXAM  02/10/2023   OPHTHALMOLOGY EXAM  02/17/2023   HEMOGLOBIN A1C  03/09/2023   INFLUENZA VACCINE  05/05/2023   COVID-19 Vaccine (7 - 2023-24 season) 06/05/2023   Diabetic kidney evaluation - Urine ACR  09/08/2023   Diabetic kidney evaluation - eGFR measurement  01/12/2024   Medicare Annual Wellness (AWV)  06/16/2024   Pneumonia Vaccine 40+ Years old  Completed   Zoster Vaccines- Shingrix  Completed   HPV VACCINES  Aged Out    Health Maintenance  Health Maintenance Due  Topic Date Due    DTaP/Tdap/Td (1 - Tdap) 07/17/2022   FOOT EXAM  02/10/2023   OPHTHALMOLOGY EXAM  02/17/2023   HEMOGLOBIN A1C  03/09/2023   INFLUENZA VACCINE  05/05/2023   COVID-19 Vaccine (7 - 2023-24 season) 06/05/2023        Additional Screening:    Vision Screening: Recommended annual ophthalmology exams for early detection of glaucoma and other disorders of the eye. Is the patient up to date with their annual eye exam?  Yes  Who is the provider or what is the name of the office in which the patient attends annual eye exams? [facility] If pt is not established with a provider, would they like to be referred to a provider to establish care? No .   Dental Screening: Recommended annual dental exams for proper oral hygiene  Diabetic Foot Exam: Diabetic Foot Exam: Overdue, Pt has been advised about the importance in completing this exam. Pt is scheduled for diabetic foot exam on Deferred.  Community Resource Referral / Chronic Care Management:  CRR required this visit?  No   CCM required this visit?  No     Plan:     I have personally reviewed and noted the following in the patient's chart:  Medical and social history Use of alcohol, tobacco or illicit drugs  Current medications and supplements including opioid prescriptions. Patient is not currently taking opioid prescriptions. Functional ability and status Nutritional status Physical activity Advanced directives List of other physicians Hospitalizations, surgeries, and ER visits in previous 12 months Vitals Screenings to include cognitive, depression, and falls Referrals and appointments  In addition, I have reviewed and discussed with patient certain preventive protocols, quality metrics, and best practice recommendations. A written personalized care plan for preventive services as well as general preventive health recommendations were provided to patient.     Tillie Rung, LPN   [id number]   After Visit  Summary: Given  Nurse Notes: None

## 2023-07-28 ENCOUNTER — Ambulatory Visit: Payer: Medicare HMO

## 2023-07-28 DIAGNOSIS — Z23 Encounter for immunization: Secondary | ICD-10-CM

## 2023-08-25 ENCOUNTER — Encounter: Payer: Self-pay | Admitting: Adult Health

## 2023-08-25 ENCOUNTER — Ambulatory Visit (INDEPENDENT_AMBULATORY_CARE_PROVIDER_SITE_OTHER): Payer: Medicare HMO | Admitting: Adult Health

## 2023-08-25 VITALS — BP 124/82 | HR 43 | Temp 97.7°F | Ht 66.5 in | Wt 157.0 lb

## 2023-08-25 DIAGNOSIS — E119 Type 2 diabetes mellitus without complications: Secondary | ICD-10-CM

## 2023-08-25 DIAGNOSIS — I10 Essential (primary) hypertension: Secondary | ICD-10-CM

## 2023-08-25 DIAGNOSIS — D492 Neoplasm of unspecified behavior of bone, soft tissue, and skin: Secondary | ICD-10-CM | POA: Diagnosis not present

## 2023-08-25 DIAGNOSIS — M150 Primary generalized (osteo)arthritis: Secondary | ICD-10-CM | POA: Diagnosis not present

## 2023-08-25 DIAGNOSIS — Z7984 Long term (current) use of oral hypoglycemic drugs: Secondary | ICD-10-CM

## 2023-08-25 DIAGNOSIS — N400 Enlarged prostate without lower urinary tract symptoms: Secondary | ICD-10-CM

## 2023-08-25 DIAGNOSIS — E782 Mixed hyperlipidemia: Secondary | ICD-10-CM | POA: Diagnosis not present

## 2023-08-25 DIAGNOSIS — Z0000 Encounter for general adult medical examination without abnormal findings: Secondary | ICD-10-CM

## 2023-08-25 DIAGNOSIS — E039 Hypothyroidism, unspecified: Secondary | ICD-10-CM

## 2023-08-25 LAB — COMPREHENSIVE METABOLIC PANEL
ALT: 27 U/L (ref 0–53)
AST: 28 U/L (ref 0–37)
Albumin: 4.6 g/dL (ref 3.5–5.2)
Alkaline Phosphatase: 63 U/L (ref 39–117)
BUN: 17 mg/dL (ref 6–23)
CO2: 28 meq/L (ref 19–32)
Calcium: 9.4 mg/dL (ref 8.4–10.5)
Chloride: 102 meq/L (ref 96–112)
Creatinine, Ser: 0.88 mg/dL (ref 0.40–1.50)
GFR: 78.81 mL/min (ref >60.00)
Glucose, Bld: 116 mg/dL — ABNORMAL HIGH (ref 70–99)
Potassium: 4.2 meq/L (ref 3.5–5.1)
Sodium: 140 meq/L (ref 135–145)
Total Bilirubin: 0.7 mg/dL (ref 0.2–1.2)
Total Protein: 7 g/dL (ref 6.0–8.3)

## 2023-08-25 LAB — PSA: PSA: 1.47 ng/mL (ref 0.10–4.00)

## 2023-08-25 LAB — LIPID PANEL
Cholesterol: 156 mg/dL (ref 0–200)
HDL: 38.9 mg/dL — ABNORMAL LOW (ref >39.00)
LDL Cholesterol: 95 mg/dL (ref 0–99)
NonHDL: 116.75
Total CHOL/HDL Ratio: 4
Triglycerides: 108 mg/dL (ref 0.0–149.0)
VLDL: 21.6 mg/dL (ref 0.0–40.0)

## 2023-08-25 LAB — MICROALBUMIN / CREATININE URINE RATIO
Creatinine,U: 98.4 mg/dL
Microalb Creat Ratio: 0.7 mg/g (ref 0.0–30.0)
Microalb, Ur: <0.7 mg/dL (ref 0.0–1.9)

## 2023-08-25 LAB — CBC
HCT: 42.4 % (ref 39.0–52.0)
Hemoglobin: 14.3 g/dL (ref 13.0–17.0)
MCHC: 33.6 g/dL (ref 30.0–36.0)
MCV: 94.4 fL (ref 78.0–100.0)
Platelets: 183 10*3/uL (ref 150.0–400.0)
RBC: 4.49 Mil/uL (ref 4.22–5.81)
RDW: 14.1 % (ref 11.5–15.5)
WBC: 4.5 10*3/uL (ref 4.0–10.5)

## 2023-08-25 LAB — HEMOGLOBIN A1C: Hgb A1c MFr Bld: 6.7 % — ABNORMAL HIGH (ref 4.6–6.5)

## 2023-08-25 LAB — TSH: TSH: 4.81 u[IU]/mL (ref 0.35–5.50)

## 2023-08-25 NOTE — Patient Instructions (Signed)
It was great seeing you today   We will follow up with you regarding your lab work   Please let me know if you need anything   

## 2023-08-25 NOTE — Addendum Note (Signed)
Addended by: Nancy Fetter on: 08/25/2023 10:29 AM   Modules accepted: Orders

## 2023-08-25 NOTE — Progress Notes (Addendum)
Subjective:    Patient ID: Todd Watson, male    DOB: Aug 12, 1941, 82 y.o.   MRN: [id number]  HPI Patient presents for yearly preventative medicine examination. He is a pleasant 82 year old male who  has a past medical history of Arthritis, GERD (gastroesophageal reflux disease), Herpes zoster, Hiatal hernia, Hypothyroidism, and Osteoarthritis of lumbar spine.  He gets most of his care at the [place].   DM Type II - managed with metformin 500 mg daily at breakfast. He denies hypoglycemic events. He has thick callous on his fingers and has a hard time getting blood to check his blood sugar. [facility]  Lab Results  Component Value Date   HGBA1C 6.1 (A) 09/07/2022   HGBA1C 6.3 (A) 06/23/2021   HGBA1C 6.9 (H) 12/11/2020   HTN - managed with lisinopril 5 mg in the morning and 5 mg in the evening. He denies dizziness, lightheadedness, chest pain, or shortness of breath.  BP Readings from Last 3 Encounters:  08/25/23 124/82  06/17/23 118/62  01/12/23 120/70   Hyperlipidemia-prescribed Lipitor 10 mg at bedtime and aspirin 81 mg. Lab Results  Component Value Date   CHOL 144 12/11/2020   HDL 44.70 12/11/2020   LDLCALC 84 12/11/2020   TRIG 76.0 12/11/2020   CHOLHDL 3 12/11/2020   Hypothyroidism-managed with Synthroid 88 mcg daily.  Lab Results  Component Value Date   TSH 2.17 12/11/2020    BPH-controlled on Flomax 0.4 mg daily  Osteoarthritis-mostly in his hips and back.  He does use anti-inflammatory medication as needed and this seems to work well for him  Skin Neoplasms - he has some areas on his face that he is concerned about. Areas do not go away and are painful at times. He has not had any drainage   All immunizations and health maintenance protocols were reviewed with the patient and needed orders were placed.  Appropriate screening laboratory values were ordered for the patient including screening of hyperlipidemia, renal function and hepatic  function. If indicated by BPH, a PSA was ordered.  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   Wt Readings from Last 3 Encounters:  08/25/23 157 lb (71.2 kg)  06/17/23 160 lb 1.3 oz (72.6 kg)  01/12/23 163 lb (73.9 kg)   Review of Systems  Constitutional: Negative.   HENT: Negative.    Eyes: Negative.   Respiratory: Negative.    Cardiovascular: Negative.   Gastrointestinal: Negative.   Endocrine: Negative.   Genitourinary: Negative.   Musculoskeletal:  Positive for arthralgias and back pain.  Skin: Negative.   Allergic/Immunologic: Negative.   Neurological: Negative.   Hematological: Negative.   Psychiatric/Behavioral: Negative.    All other systems reviewed and are negative.  Past Medical History:  Diagnosis Date   Arthritis    GERD (gastroesophageal reflux disease)    Herpes zoster    Hiatal hernia    Hypothyroidism    Osteoarthritis of lumbar spine     Social History   Socioeconomic History   Marital status: Married    Spouse name: Not on file   Number of children: 3   Years of education: 10   Highest education level: GED or equivalent  Occupational History  Occupation: retired  Tobacco Use   Smoking status: Never   Smokeless tobacco: Never  Vaping Use   Vaping status: Never Used  Substance and Sexual Activity   Alcohol use: No   Drug use: No   Sexual activity: Yes  Other Topics Concern   Not on file  Social History Narrative   Lives with wife.    3 children     6 grandchildren & 5 great grandchildren   Likes to fish    Social Determinants of Health   Financial Resource Strain: Low Risk  (06/17/2023)   Overall Financial Resource Strain (CARDIA)    Difficulty of Paying Living Expenses: Not hard at  all  Food Insecurity: No Food Insecurity (06/17/2023)   Hunger Vital Sign    Worried About Running Out of Food in the Last Year: Never true    Ran Out of Food in the Last Year: Never true  Transportation Needs: No Transportation Needs (06/17/2023)   PRAPARE - Administrator, Civil Service (Medical): No    Lack of Transportation (Non-Medical): No  Physical Activity: Inactive (06/17/2023)   Exercise Vital Sign    Days of Exercise per Week: 0 days    Minutes of Exercise per Session: 0 min  Stress: No Stress Concern Present (06/17/2023)   [employer] of Occupational Health - Occupational Stress Questionnaire    Feeling of Stress : Not at all  Social Connections: Socially Integrated (06/17/2023)   Social Connection and Isolation Panel [NHANES]    Frequency of Communication with Friends and Family: More than three times a week    Frequency of Social Gatherings with Friends and Family: More than three times a week    Attends Religious Services: More than 4 times per year    Active Member of Clubs or Organizations: Yes    Attends Banker Meetings: More than 4 times per year    Marital Status: Married  Catering manager Violence: Not At Risk (06/17/2023)   Humiliation, Afraid, Rape, and Kick questionnaire    Fear of Current or Ex-Partner: No    Emotionally Abused: No    Physically Abused: No    Sexually Abused: No    Past Surgical History:  Procedure Laterality Date   APPENDECTOMY  2000   INGUINAL HERNIA REPAIR Bilateral    LUMBAR LAMINECTOMY/DECOMPRESSION MICRODISCECTOMY Right 01/28/2020   Procedure: Microdiscectomy - right - Lumbar four-Lumbar five;  Surgeon: Donalee Citrin, MD;  Location[facility] OR;  Service: Neurosurgery;  Laterality: Right;   SPINE SURGERY     spurs    Family History  Problem Relation Age of Onset   Heart disease Father        Valve disease   Kidney failure Mother     No Known Allergies  Current Outpatient Medications on File Prior to  Visit  Medication Sig Dispense Refill   atorvastatin (LIPITOR) 10 MG tablet Take 10 mg by mouth at bedtime.     blood glucose meter kit and supplies KIT Dispense based on patient and insurance preference. Use up to four times daily as directed. 1 each 0   Boswellia-Glucosamine-Vit D (OSTEO BI-FLEX ONE PER DAY PO) Take 1 tablet by mouth daily.     clotrimazole-betamethasone (LOTRISONE) cream SMARTSIG:1 Gram(s) Topical Daily PRN     diclofenac Sodium (VOLTAREN) 1 % GEL Apply 1 application  topically 4 (four) times daily as needed (pain).     gabapentin (NEURONTIN) 100 MG capsule Take 1 capsule (100 mg total) by  mouth at bedtime. 30 capsule 1   levothyroxine (SYNTHROID) 88 MCG tablet Take 88 mcg by mouth daily before breakfast.     lisinopril (PRINIVIL,ZESTRIL) 10 MG tablet Take 5 mg by mouth in the morning and at bedtime.      Magnesium 250 MG TABS Take by mouth daily.     metFORMIN (GLUCOPHAGE) 500 MG tablet Take by mouth daily with breakfast.     Multiple Vitamin (MULTIVITAMIN) tablet Take 1 tablet by mouth daily.     Omega-3 1000 MG CAPS Take 1,000 mg by mouth daily.     omeprazole (PRILOSEC) 20 MG capsule Take 20 mg by mouth in the morning and at bedtime.     Probiotic Product (PROBIOTIC DAILY PO) Take 1 capsule by mouth daily.      psyllium (METAMUCIL) 58.6 % powder Take 1 packet by mouth daily as needed (constipation).     sildenafil (VIAGRA) 100 MG tablet Take by mouth.     tamsulosin (FLOMAX) 0.4 MG CAPS capsule Take 0.4 mg by mouth at bedtime.     vitamin C (ASCORBIC ACID) 500 MG tablet Take 500 mg by mouth daily.     No current facility-administered medications on file prior to visit.    BP 124/82   Pulse (!) 43   Temp 97.7 F (36.5 C) (Oral)   Ht 5' 6.5" (1.689 m)   Wt 157 lb (71.2 kg)   SpO2 97%   BMI 24.96 kg/m       Objective:   Physical Exam Vitals and nursing note reviewed.  Constitutional:      General: He is not in acute distress.    Appearance: Normal  appearance. He is not ill-appearing.  HENT:     Head: Normocephalic and atraumatic.     Right Ear: Tympanic membrane, ear canal and external ear normal. There is no impacted cerumen.     Left Ear: Tympanic membrane, ear canal and external ear normal. There is no impacted cerumen.     Nose: Nose normal. No congestion or rhinorrhea.     Mouth/Throat:     Mouth: Mucous membranes are moist.     Pharynx: Oropharynx is clear.  Eyes:     Extraocular Movements: Extraocular movements intact.     Conjunctiva/sclera: Conjunctivae normal.     Pupils: Pupils are equal, round, and reactive to light.  Neck:     Vascular: No carotid bruit.  Cardiovascular:     Rate and Rhythm: Normal rate and regular rhythm.     Pulses: Normal pulses.     Heart sounds: No murmur heard.    No friction rub. No gallop.  Pulmonary:     Effort: Pulmonary effort is normal.     Breath sounds: Normal breath sounds.  Abdominal:     General: Abdomen is flat. Bowel sounds are normal. There is no distension.     Palpations: Abdomen is soft. There is no mass.     Tenderness: There is no abdominal tenderness. There is no guarding or rebound.     Hernia: No hernia is present.  Musculoskeletal:        General: Normal range of motion.     Cervical back: Normal range of motion and neck supple.  Lymphadenopathy:     Cervical: No cervical adenopathy.  Skin:    General: Skin is warm and dry.     Capillary Refill: Capillary refill takes less than 2 seconds.     Comments: He does have a precancerous or small ?  BCC on his left cheek.   Neurological:     General: No focal deficit present.     Mental Status: He is alert and oriented to person, place, and time.  Psychiatric:        Mood and Affect: Mood normal.        Behavior: Behavior normal.        Thought Content: Thought content normal.        Judgment: Judgment normal.        Assessment & Plan:  1. Routine general medical examination at a health care facility Today  patient counseled on age appropriate routine health concerns for screening and prevention, each reviewed and up to date or declined. Immunizations reviewed and up to date or declined. Labs ordered and reviewed. Risk factors for depression reviewed and negative. Hearing function and visual acuity are intact. ADLs screened and addressed as needed. Functional ability and level of safety reviewed and appropriate. Education, counseling and referrals performed based on assessed risks today. Patient provided with a copy of personalized plan for preventive services. - Follow up in 1 year or sooner if needed - Continue to stay active and eat healthy    2. Diabetes mellitus treated with oral medication (HCC) - Consider increase in metformin  - [facility] at [facility] - Lipid panel; Future - TSH; Future - CBC; Future - Comprehensive metabolic panel; Future - Hemoglobin A1c; Future - Microalbumin/Creatinine Ratio, Urine; Future  3. Essential hypertension, benign - Well controlled. No change  - Lipid panel; Future - TSH; Future - CBC; Future - Comprehensive metabolic panel; Future  4. Mixed hyperlipidemia - Consider increase in statin  - Lipid panel; Future - TSH; Future - CBC; Future - Comprehensive metabolic panel; Future  5. Hypothyroidism, unspecified type - Consider increase in synthroid  - Lipid panel; Future - TSH; Future - CBC; Future - Comprehensive metabolic panel; Future  6. Primary osteoarthritis involving multiple joints  - Lipid panel; Future - TSH; Future - CBC; Future - Comprehensive metabolic panel; Future  7. Benign prostatic hyperplasia without lower urinary tract symptoms  - PSA; Future   8. Skin neoplasm  - Ambulatory referral to Dermatology

## 2023-08-31 DIAGNOSIS — M5416 Radiculopathy, lumbar region: Secondary | ICD-10-CM | POA: Diagnosis not present

## 2023-09-12 DIAGNOSIS — M5416 Radiculopathy, lumbar region: Secondary | ICD-10-CM | POA: Diagnosis not present

## 2023-10-24 ENCOUNTER — Ambulatory Visit (INDEPENDENT_AMBULATORY_CARE_PROVIDER_SITE_OTHER): Payer: Medicare HMO | Admitting: Family Medicine

## 2023-10-24 ENCOUNTER — Encounter: Payer: Self-pay | Admitting: Family Medicine

## 2023-10-24 ENCOUNTER — Ambulatory Visit: Payer: Medicare HMO

## 2023-10-24 VITALS — BP 126/76 | HR 67 | Temp 98.7°F | Resp 16 | Ht 66.5 in | Wt 160.0 lb

## 2023-10-24 DIAGNOSIS — R059 Cough, unspecified: Secondary | ICD-10-CM

## 2023-10-24 DIAGNOSIS — J069 Acute upper respiratory infection, unspecified: Secondary | ICD-10-CM | POA: Diagnosis not present

## 2023-10-24 MED ORDER — BENZONATATE 100 MG PO CAPS: 100.0000 mg | ORAL_CAPSULE | Freq: Two times a day (BID) | ORAL | 0 refills | Status: AC | PRN

## 2023-10-24 NOTE — Progress Notes (Addendum)
ACUTE VISIT Chief Complaint  Patient presents with   Cough    X 2 weeks    sneezing   HPI: Mr.Airon W Donner is a 83 y.o. male with a PMHx significant for HTN, hiatal hernia, GERD, cholelithiases, hypothyroidism, HLD, renal calculi, and DM, who is here today complaining of productive cough and sneezing for two weeks.  He says the cough is worse at night.  He has been taking a cough medicine he had prescribed in the past, but can't recall the name. Has also taken some allergy pills yesterday and today he feels better.  Cough This is a new problem. The current episode started 1 to 4 weeks ago. The cough is Productive of sputum. Associated symptoms include nasal congestion, postnasal drip and rhinorrhea. Pertinent negatives include no chest pain, ear pain, headaches or wheezing. He has tried prescription cough suppressant for the symptoms. The treatment provided mild relief. There is no history of environmental allergies.   Pertinent negatives include fever, chills, body aches, hemoptysis, chest pain, SOB, wheezing, abdominal pain, nausea, heartburn, itchy eyes, itchy throat, itchy nose, ear discomfort, or known sick contacts.   DM II: He is not checking BS's. Lab Results  Component Value Date   HGBA1C 6.7 (H) 08/25/2023   Review of Systems  Constitutional:  Positive for fatigue. Negative for activity change and appetite change.  HENT:  Positive for postnasal drip and rhinorrhea. Negative for ear pain and trouble swallowing.   Respiratory:  Positive for cough. Negative for wheezing.   Cardiovascular:  Negative for chest pain.  Gastrointestinal:  Negative for vomiting.       No changes in bowel habits.  Genitourinary:  Negative for decreased urine volume, dysuria and hematuria.  Allergic/Immunologic: Negative for environmental allergies.  Neurological:  Negative for headaches.  Psychiatric/Behavioral:  Negative for confusion and hallucinations.   See other pertinent positives and  negatives in HPI.  Current Outpatient Medications on File Prior to Visit  Medication Sig Dispense Refill   atorvastatin (LIPITOR) 10 MG tablet Take 10 mg by mouth at bedtime.     blood glucose meter kit and supplies KIT Dispense based on patient and insurance preference. Use up to four times daily as directed. 1 each 0   Boswellia-Glucosamine-Vit D (OSTEO BI-FLEX ONE PER DAY PO) Take 1 tablet by mouth daily.     clotrimazole-betamethasone (LOTRISONE) cream SMARTSIG:1 Gram(s) Topical Daily PRN     diclofenac Sodium (VOLTAREN) 1 % GEL Apply 1 application  topically 4 (four) times daily as needed (pain).     gabapentin (NEURONTIN) 100 MG capsule Take 1 capsule (100 mg total) by mouth at bedtime. 30 capsule 1   levothyroxine (SYNTHROID) 88 MCG tablet Take 88 mcg by mouth daily before breakfast.     lisinopril (PRINIVIL,ZESTRIL) 10 MG tablet Take 5 mg by mouth in the morning and at bedtime.      Magnesium 250 MG TABS Take by mouth daily.     metFORMIN (GLUCOPHAGE) 500 MG tablet Take by mouth daily with breakfast.     Multiple Vitamin (MULTIVITAMIN) tablet Take 1 tablet by mouth daily.     Omega-3 1000 MG CAPS Take 1,000 mg by mouth daily.     omeprazole (PRILOSEC) 20 MG capsule Take 20 mg by mouth in the morning and at bedtime.     Probiotic Product (PROBIOTIC DAILY PO) Take 1 capsule by mouth daily.      psyllium (METAMUCIL) 58.6 % powder Take 1 packet by mouth daily as needed (  constipation).     sildenafil (VIAGRA) 100 MG tablet Take by mouth.     tamsulosin (FLOMAX) 0.4 MG CAPS capsule Take 0.4 mg by mouth at bedtime.     vitamin C (ASCORBIC ACID) 500 MG tablet Take 500 mg by mouth daily.     No current facility-administered medications on file prior to visit.   Past Medical History:  Diagnosis Date   Arthritis    GERD (gastroesophageal reflux disease)    Herpes zoster    Hiatal hernia    Hypothyroidism    Osteoarthritis of lumbar spine    No Known Allergies  Social History    Socioeconomic History   Marital status: Married    Spouse name: Not on file   Number of children: 3   Years of education: 10   Highest education level: GED or equivalent  Occupational History   Occupation: retired  Tobacco Use   Smoking status: Never   Smokeless tobacco: Never  Vaping Use   Vaping status: Never Used  Substance and Sexual Activity   Alcohol use: No   Drug use: No   Sexual activity: Yes  Other Topics Concern   Not on file  Social History Narrative   Lives with wife.    3 children     6 grandchildren & 5 great grandchildren   Likes to fish    Social Drivers of Health   Financial Resource Strain: Low Risk  (06/17/2023)   Overall Financial Resource Strain (CARDIA)    Difficulty of Paying Living Expenses: Not hard at all  Food Insecurity: No Food Insecurity (06/17/2023)   Hunger Vital Sign    Worried About Running Out of Food in the Last Year: Never true    Ran Out of Food in the Last Year: Never true  Transportation Needs: No Transportation Needs (06/17/2023)   PRAPARE - Administrator, Civil Service (Medical): No    Lack of Transportation (Non-Medical): No  Physical Activity: Inactive (06/17/2023)   Exercise Vital Sign    Days of Exercise per Week: 0 days    Minutes of Exercise per Session: 0 min  Stress: No Stress Concern Present (06/17/2023)   [employer] of Occupational Health - Occupational Stress Questionnaire    Feeling of Stress : Not at all  Social Connections: Socially Integrated (06/17/2023)   Social Connection and Isolation Panel [NHANES]    Frequency of Communication with Friends and Family: More than three times a week    Frequency of Social Gatherings with Friends and Family: More than three times a week    Attends Religious Services: More than 4 times per year    Active Member of [employer] or Organizations: Yes    Attends Banker Meetings: More than 4 times per year    Marital Status: Married    Vitals:    10/24/23 1426  BP: 126/76  Pulse: 67  Resp: 16  Temp: 98.7 F (37.1 C)  SpO2: 94%   Body mass index is 25.44 kg/m.  Physical Exam Vitals and nursing note reviewed.  Constitutional:      General: He is not in acute distress.    Appearance: He is well-developed. He is not ill-appearing.  HENT:     Head: Normocephalic and atraumatic.     Nose: Rhinorrhea present.     Right Sinus: No maxillary sinus tenderness or frontal sinus tenderness.     Left Sinus: No maxillary sinus tenderness or frontal sinus tenderness.  Mouth/Throat:     Mouth: Mucous membranes are moist.     Pharynx: Oropharynx is clear. Uvula midline. Postnasal drip present.  Eyes:     Conjunctiva/sclera: Conjunctivae normal.  Cardiovascular:     Rate and Rhythm: Normal rate and regular rhythm.     Heart sounds: Murmur (Soft SEM RUSB-LUSB) heard.  Pulmonary:     Effort: Pulmonary effort is normal. No respiratory distress.     Breath sounds: Normal breath sounds. No stridor.  Lymphadenopathy:     Cervical: No cervical adenopathy.  Skin:    General: Skin is warm.     Findings: No erythema or rash.  Neurological:     General: No focal deficit present.     Mental Status: He is alert and oriented to person, place, and time.     Comments: Stable gait, no assisted.  Psychiatric:        Mood and Affect: Mood and affect normal.   ASSESSMENT AND PLAN:  Mr. Lenton was seen today for productive cough and rhinorrhea.   Cough, unspecified type We discussed possible etiologies. ?Post viral. Hx and examination do not suggest a serious illness. Lung auscultation negative. Because risk of complications due to co morbilities, CXR ordered today. He has had Benzonatate in the past, Rx sent. OTC Plain mucinex recommended. Adequate hydration. Clearly instructed about warning signs.  -     Benzonatate; Take 1-2 capsules (100-200 mg total) by mouth 2 (two) times daily as needed for up to 10 days.  Dispense: 30 capsule;  Refill: 0 -     DG Chest 2 View; Future  URI, acute Most likely viral, now with residual symptoms. I do not think abx is needed at this time. Monitor for fever or worsening symptoms.  Further recommendations according to CXR results.  CXR done today: I do not see focal consolidation. Pending radiology report.  -Reports hx of heart murmur. Return if symptoms worsen or fail to improve, for keep next appointment.  I, Rolla Etienne Wierda, acting as a scribe for Akelia Husted [place], MD., have documented all relevant documentation on the behalf of Sukanya Goldblatt [place], MD, as directed by  Tressie Ragin [place], MD while in the presence of Kellyjo Edgren [place], MD.   I, Heaton Sarin [place], MD, have reviewed all documentation for this visit. The documentation on 10/24/23 for the exam, diagnosis, procedures, and orders are all accurate and complete.  Giovanne Nickolson G. [place], MD  [facility]. [place] office.

## 2023-10-24 NOTE — Patient Instructions (Addendum)
A few things to remember from today's visit:  Cough, unspecified type - Plan: benzonatate (TESSALON) 100 MG capsule, DG Chest 2 View  Benzonatate for cough. You can also try Mucinex, plain. Monitor for new symptoms.  Do not use My Chart to request refills or for acute issues that need immediate attention. If you send a my chart message, it may take a few days to be addressed, specially if I am not in the office.  Please be sure medication list is accurate. If a new problem present, please set up appointment sooner than planned today.

## 2023-10-28 ENCOUNTER — Encounter: Payer: Self-pay | Admitting: Family Medicine

## 2023-11-08 ENCOUNTER — Other Ambulatory Visit: Payer: Self-pay

## 2023-11-08 ENCOUNTER — Emergency Department (HOSPITAL_BASED_OUTPATIENT_CLINIC_OR_DEPARTMENT_OTHER)
Admission: EM | Admit: 2023-11-08 | Discharge: 2023-11-08 | Disposition: A | Discharge status: Home / Self Care | Payer: No Typology Code available for payment source | Attending: Emergency Medicine | Admitting: Emergency Medicine

## 2023-11-08 ENCOUNTER — Encounter (HOSPITAL_BASED_OUTPATIENT_CLINIC_OR_DEPARTMENT_OTHER): Payer: Self-pay

## 2023-11-08 ENCOUNTER — Emergency Department (HOSPITAL_BASED_OUTPATIENT_CLINIC_OR_DEPARTMENT_OTHER): Payer: No Typology Code available for payment source | Admitting: Radiology

## 2023-11-08 ENCOUNTER — Ambulatory Visit: Payer: Self-pay | Admitting: Adult Health

## 2023-11-08 DIAGNOSIS — R0789 Other chest pain: Secondary | ICD-10-CM | POA: Diagnosis present

## 2023-11-08 DIAGNOSIS — Z79899 Other long term (current) drug therapy: Secondary | ICD-10-CM | POA: Diagnosis not present

## 2023-11-08 DIAGNOSIS — I10 Essential (primary) hypertension: Secondary | ICD-10-CM | POA: Diagnosis not present

## 2023-11-08 DIAGNOSIS — E119 Type 2 diabetes mellitus without complications: Secondary | ICD-10-CM | POA: Insufficient documentation

## 2023-11-08 DIAGNOSIS — Z7984 Long term (current) use of oral hypoglycemic drugs: Secondary | ICD-10-CM | POA: Diagnosis not present

## 2023-11-08 LAB — BASIC METABOLIC PANEL
Anion gap: 7 (ref 5–15)
BUN: 15 mg/dL (ref 8–23)
CO2: 25 mmol/L (ref 22–32)
Calcium: 8.8 mg/dL — ABNORMAL LOW (ref 8.9–10.3)
Chloride: 107 mmol/L (ref 98–111)
Creatinine, Ser: 0.85 mg/dL (ref 0.61–1.24)
GFR, Estimated: >60 mL/min (ref >60)
Glucose, Bld: 116 mg/dL — ABNORMAL HIGH (ref 70–99)
Potassium: 4.2 mmol/L (ref 3.5–5.1)
Sodium: 139 mmol/L (ref 135–145)

## 2023-11-08 LAB — CBC
HCT: 40.2 % (ref 39.0–52.0)
Hemoglobin: 13.6 g/dL (ref 13.0–17.0)
MCH: 31.4 pg (ref 26.0–34.0)
MCHC: 33.8 g/dL (ref 30.0–36.0)
MCV: 92.8 fL (ref 80.0–100.0)
Platelets: 177 10*3/uL (ref 150–400)
RBC: 4.33 MIL/uL (ref 4.22–5.81)
RDW: 13.8 % (ref 11.5–15.5)
WBC: 4.7 10*3/uL (ref 4.0–10.5)
nRBC: 0 % (ref 0.0–0.2)

## 2023-11-08 LAB — TROPONIN I (HIGH SENSITIVITY)
Troponin I (High Sensitivity): 6 ng/L (ref <18)
Troponin I (High Sensitivity): 5 ng/L (ref <18)

## 2023-11-08 NOTE — ED Provider Notes (Signed)
 [place] EMERGENCY DEPARTMENT AT [facility] Provider Note   CSN: [id number] Arrival date & time: 11/08/23  9065     History  Chief Complaint  Patient presents with   Chest Pain    Todd Watson is a 83 y.o. male.  Patient presents emergency department today for evaluation of chest pain.  Patient has a history of hypertension, high cholesterol, diabetes.  Patient states that he has had several episodes of very brief, short-lived sharp chest pain in the mid to left chest starting last night.  Patient had been working cutting wood.  He states that after he was finished, walking into his house he had a sharp pain lasting about a second.  He then had a few more episodes this morning.  No associated vomiting, diaphoresis, shortness of breath.  Some of the episodes this morning occurred at rest.  No significant cardiac history.  Pain did not radiate.      Home Medications Prior to Admission medications   Medication Sig Start Date End Date Taking? Authorizing Provider  atorvastatin  (LIPITOR) 10 MG tablet Take 10 mg by mouth at bedtime.    [provider]  blood glucose meter kit and supplies KIT Dispense based on patient and insurance preference. Use up to four times daily as directed. 09/02/22   Nafziger, Cory, NP  Boswellia-Glucosamine-Vit D (OSTEO BI-FLEX ONE PER DAY PO) Take 1 tablet by mouth daily.    [provider]  clotrimazole-betamethasone (LOTRISONE) cream SMARTSIG:1 Gram(s) Topical Daily PRN 08/06/23   [provider]  diclofenac  Sodium (VOLTAREN ) 1 % GEL Apply 1 application  topically 4 (four) times daily as needed (pain).    [provider]  gabapentin  (NEURONTIN ) 100 MG capsule Take 1 capsule (100 mg total) by mouth at bedtime. 04/21/22   Vernetta Lonni GRADE, MD  levothyroxine  (SYNTHROID ) 88 MCG tablet Take 88 mcg by mouth daily before breakfast.    [provider]  lisinopril  (PRINIVIL ,ZESTRIL ) 10 MG tablet Take 5 mg  by mouth in the morning and at bedtime.     [provider]  Magnesium 250 MG TABS Take by mouth daily.    [provider]  metFORMIN  (GLUCOPHAGE ) 500 MG tablet Take by mouth daily with breakfast.    [provider]  Multiple Vitamin (MULTIVITAMIN) tablet Take 1 tablet by mouth daily.    [provider]  Omega-3 1000 MG CAPS Take 1,000 mg by mouth daily.    [provider]  omeprazole  (PRILOSEC) 20 MG capsule Take 20 mg by mouth in the morning and at bedtime.    [provider]  Probiotic Product (PROBIOTIC DAILY PO) Take 1 capsule by mouth daily.     [provider]  psyllium (METAMUCIL) 58.6 % powder Take 1 packet by mouth daily as needed (constipation).    [provider]  sildenafil  (VIAGRA ) 100 MG tablet Take by mouth. 12/09/22   [provider]  tamsulosin  (FLOMAX ) 0.4 MG CAPS capsule Take 0.4 mg by mouth at bedtime.    [provider]  vitamin C (ASCORBIC ACID ) 500 MG tablet Take 500 mg by mouth daily.    [provider]      Allergies    Patient has no known allergies.    Review of Systems   Review of Systems  Physical Exam Updated Vital Signs BP (!) 164/82   Pulse (!) 49   Temp 98.4 F (36.9 C) (Oral)   Resp 15   Ht 5' 6.5 (1.689  m)   Wt 72.6 kg   SpO2 96%   BMI 25.44 kg/m   Physical Exam Vitals and nursing note reviewed.  Constitutional:      Appearance: He is well-developed. He is not diaphoretic.  HENT:     Head: Normocephalic and atraumatic.     Mouth/Throat:     Mouth: Mucous membranes are not dry.  Eyes:     Conjunctiva/sclera: Conjunctivae normal.  Neck:     Vascular: Normal carotid pulses. No carotid bruit or JVD.     Trachea: Trachea normal. No tracheal deviation.  Cardiovascular:     Rate and Rhythm: Normal rate and regular rhythm.     Pulses: No decreased pulses.          Radial pulses are 2+ on the right side and 2+ on the left side.     Heart sounds:  Normal heart sounds, S1 normal and S2 normal. Heart sounds not distant. No murmur heard. Pulmonary:     Effort: Pulmonary effort is normal. No respiratory distress.     Breath sounds: Normal breath sounds. No wheezing.  Chest:     Chest wall: No tenderness.  Abdominal:     General: Bowel sounds are normal.     Palpations: Abdomen is soft.     Tenderness: There is no abdominal tenderness. There is no guarding or rebound.  Musculoskeletal:     Cervical back: Normal range of motion and neck supple. No muscular tenderness.     Right lower leg: No edema.     Left lower leg: No edema.  Skin:    General: Skin is warm and dry.     Coloration: Skin is not pale.  Neurological:     Mental Status: He is alert. Mental status is at baseline.  Psychiatric:        Mood and Affect: Mood normal.    ED Results / Procedures / Treatments   Labs (all labs ordered are listed, but only abnormal results are displayed) Labs Reviewed  BASIC METABOLIC PANEL - Abnormal; Notable for the following components:      Result Value   Glucose, Bld 116 (*)    Calcium  8.8 (*)    All other components within normal limits  CBC  TROPONIN I (HIGH SENSITIVITY)  TROPONIN I (HIGH SENSITIVITY)    EKG EKG Interpretation Date/Time:  Tuesday November 08 2023 09:41:05 EST Ventricular Rate:  58 PR Interval:  166 QRS Duration:  88 QT Interval:  416 QTC Calculation: 408 R Axis:   -17  Text Interpretation: Sinus bradycardia Otherwise normal ECG When compared with ECG of 01-Oct-2021 23:31, PREVIOUS ECG IS PRESENT Confirmed by Pamella Sharper [phone]) on 11/08/2023 11:34:47 AM  Radiology DG Chest 2 View Result Date: 11/08/2023 CLINICAL DATA:  Chest pain. EXAM: CHEST - 2 VIEW COMPARISON:  11/03/2023. FINDINGS: Bilateral lung fields are clear. Bilateral costophrenic angles are clear. Normal cardio-mediastinal silhouette. No acute osseous abnormalities. The soft tissues are within normal limits. IMPRESSION: No active  cardiopulmonary disease. Electronically Signed   By: Ree Molt M.D.   On: 11/08/2023 11:01    Procedures Procedures    Medications Ordered in ED Medications - No data to display  ED Course/ Medical Decision Making/ A&P    Patient seen and examined. History obtained directly from patient. Work-up including labs, imaging, EKG ordered in triage, if performed, were reviewed.    Labs/EKG: Independently reviewed and interpreted.  This included: CBC unremarkable; BMP glucose 116 otherwise unremarkable; troponin 6 normal.  Pending second Troponin.  Imaging: Independently visualized and interpreted.  This included: Chest x-ray, agree negative  Medications/Fluids: None ordered  Most recent vital signs reviewed and are as follows: BP (!) 164/82   Pulse (!) 49   Temp 98.4 F (36.9 C) (Oral)   Resp 15   Ht 5' 6.5 (1.689 m)   Wt 72.6 kg   SpO2 96%   BMI 25.44 kg/m   Initial impression: Atypical chest pain, no current symptoms.  Patient discussed with Dr. Pamella who has seen.  1:29 PM Reassessment performed. Patient appears stable.  Still with sinus bradycardia, asymptomatic.  No active chest pain.  Labs personally reviewed and interpreted including: 2nd troponin 6 > 5.   Reviewed pertinent lab work and imaging with patient at bedside. Questions answered.   Most current vital signs reviewed and are as follows: BP (!) 154/76   Pulse (!) 50   Temp 98.4 F (36.9 C) (Oral)   Resp 17   Ht 5' 6.5 (1.689 m)   Wt 72.6 kg   SpO2 96%   BMI 25.44 kg/m   Plan: Discharge to home.   Prescriptions written for: None  Return and follow-up instructions: I encouraged patient to return to ED with severe chest pain, especially if the pain is crushing or pressure-like and spreads to the arms, back, neck, or jaw, or if they have associated sweating, vomiting, or shortness of breath with the pain, or significant pain with activity. We discussed that the evaluation here today indicates a  low-risk of serious cause of chest pain, including heart trouble or a blood clot, but no evaluation is perfect and chest pain can evolve with time. The patient verbalized understanding and agreed.  I encouraged patient to follow-up with their provider in the next 72 hours for recheck.                                   Medical Decision Making Amount and/or Complexity of Data Reviewed Labs: ordered. Radiology: ordered.   For this patient's complaint of chest pain, the following emergent conditions were considered on the differential diagnosis: acute coronary syndrome, pulmonary embolism, pneumothorax, myocarditis, pericardial tamponade, aortic dissection, thoracic aortic aneurysm complication, esophageal perforation.   Other causes were also considered including: gastroesophageal reflux disease, musculoskeletal pain including costochondritis, pneumonia/pleurisy, herpes zoster, pericarditis.  In regards to possibility of ACS, patient has atypical features of pain, non-ischemic and unchanged EKG and negative troponin(s).  Patient with short-lived, approximately 1 second episodes of pain without typical features.  Very low likelihood to be ACS.  In regards to possibility of PE, symptoms are atypical for PE and risk profile is low, making PE low likelihood.   The patient's vital signs, pertinent lab work and imaging were reviewed and interpreted as discussed in the ED course. Hospitalization was considered for further testing, treatments, or serial exams/observation. However as patient is well-appearing, has a stable exam, and reassuring studies today, I do not feel that they warrant admission at this time. This plan was discussed with the patient who verbalizes agreement and comfort with this plan and seems reliable and able to return to the Emergency Department with worsening or changing symptoms.          Final Clinical Impression(s) / ED Diagnoses Final diagnoses:  Atypical chest pain     Rx / DC Orders ED Discharge Orders     None  Desiderio Chew, PA-C 11/08/23 1332    Pamella Sharper A, DO 11/12/23 [phone]

## 2023-11-08 NOTE — Telephone Encounter (Signed)
FYI pt was seen today in ED

## 2023-11-08 NOTE — ED Triage Notes (Signed)
Pt caox4 c/o of L sided CP that started yesterday on exertion. CP is intermittent, tends to come on exertion, sharp and pin point spot on the L chest. Pt denies SOB, N/V, dizziness or any additional complaints aside from the intermittent CP.

## 2023-11-08 NOTE — Discharge Instructions (Signed)
 Please read and follow all provided instructions.  Your diagnoses today include:  1. Atypical chest pain     Tests performed today include: An EKG of your heart A chest x-ray Cardiac enzymes - a blood test for heart muscle damage, were both normal Blood counts and electrolytes Vital signs. See below for your results today.   Medications prescribed:  None  Take any prescribed medications only as directed.  Follow-up instructions: Please follow-up with your primary care provider as soon as you can for further evaluation of your symptoms.   Return instructions:  SEEK IMMEDIATE MEDICAL ATTENTION IF: You have severe chest pain, especially if the pain is crushing or pressure-like and spreads to the arms, back, neck, or jaw, or if you have sweating, nausea or vomiting, or trouble with breathing. THIS IS AN EMERGENCY. Do not wait to see if the pain will go away. Get medical help at once. Call 911. DO NOT drive yourself to the hospital.  Your chest pain gets worse and does not go away after a few minutes of rest.  You have an attack of chest pain lasting longer than what you usually experience.  You have significant dizziness, if you pass out, or have trouble walking.  You have chest pain not typical of your usual pain for which you originally saw your caregiver.  You have any other emergent concerns regarding your health.  Additional Information: Chest pain comes from many different causes. Your caregiver has diagnosed you as having chest pain that is not specific for one problem, but does not require admission.  You are at low risk for an acute heart condition or other serious illness.   Your vital signs today were: BP (!) 154/76   Pulse (!) 50   Temp 98.4 F (36.9 C) (Oral)   Resp 17   Ht 5' 6.5 (1.689 m)   Wt 72.6 kg   SpO2 96%   BMI 25.44 kg/m  If your blood pressure (BP) was elevated above 135/85 this visit, please have this repeated by your doctor within one  month. --------------

## 2023-11-08 NOTE — Telephone Encounter (Signed)
 Copied from CRM [id number]. Topic: Clinical - Red Word Triage >> Nov 08, 2023  9:13 AM Gerardine PARAS wrote: Red Word that prompted transfer to Nurse Triage: Patient called in regarding chest pain- could be pulled muscle but wants to be sure. Very painful  Chief Complaint: Chest pain Symptoms: Pain Frequency: On and off since yesterday evening Pertinent Negatives: Patient denies difficulty breathing Disposition: [x] ED /[] Urgent Care (no appt availability in office) / [] Appointment(In office/virtual)/ []  [facility]/ [] Home Care/ [] Refused Recommended Disposition /[] [place]/ []  Follow-up with PCP Additional Notes: Patient called in to report episodes of chest pain that have been occurring since yesterday evening. Patient stated that he was moving a bunch of firewood yesterday and thinks he may have overexerted himself. Patient stated that the chest pain started yesterday evening and went away before bed. Patient stated that the chest pain is present again this morning and happening more frequently. Patient stated the pain is located in the left side of his chest and does not radiate anywhere. Patient stated that the pain makes him holler and he appeared to be in severe pain while on the phone with this RN. Patient drove himself to [facility] at the time of the call and requested to be seen right away. This RN called the [facility] and was advised that the patient could  not be seen at this time. This RN advised the patient to go the the nearest ED. This RN assisted the patient in locating the nearest ED. Patient was hesitant to go the ED, but confirmed he would go. This RN advised the patient to be careful driving, as he was already in the car at the time of the call. This RN advised patient to call back if anything changes. Patient complied.   Reason for Disposition  SEVERE chest pain  Answer Assessment - Initial Assessment Questions 1. LOCATION: Where does it hurt?       Left  side of chest 2. RADIATION: Does the pain go anywhere else? (e.g., into neck, jaw, arms, back)     States pain is not radiating down the arms 3. ONSET: When did the chest pain begin? (Minutes, hours or days)      Stated it started last night and it went away, but now the pain is back and happening more frequently 4. PATTERN: Does the pain come and go, or has it been constant since it started?  Does it get worse with exertion?      Comes and goes, states pain started following a lot of exertion yesterday 5. DURATION: How long does it last (e.g., seconds, minutes, hours)     Present for a few seconds and goes away 6. SEVERITY: How bad is the pain?  (e.g., Scale 1-10; mild, moderate, or severe)    - MILD (1-3): doesn't interfere with normal activities     - MODERATE (4-7): interferes with normal activities or awakens from sleep    - SEVERE (8-10): excruciating pain, unable to do any normal activities       States it makes him holler when it happens, patient had a few episodes while on the phone with this RN and appeared to be in severe pain 7. CARDIAC RISK FACTORS: Do you have any history of heart problems or risk factors for heart disease? (e.g., angina, prior heart attack; diabetes, high blood pressure, high cholesterol, smoker, or strong family history of heart disease)     States he was recently seen for a heart  murmur  9. CAUSE: What do you think is causing the chest pain?     States he moved a lot of wood yesterday 10. OTHER SYMPTOMS: Do you have any other symptoms? (e.g., dizziness, nausea, vomiting, sweating, fever, difficulty breathing, cough)     Denies difficulty breathing, denies nausea, denies vomiting  Protocols used: Chest Pain-A-AH

## 2023-11-10 ENCOUNTER — Ambulatory Visit: Payer: Medicare HMO | Admitting: Adult Health

## 2023-11-10 ENCOUNTER — Encounter: Payer: Self-pay | Admitting: Adult Health

## 2023-11-10 VITALS — Temp 98.1°F | Ht 66.5 in | Wt 158.0 lb

## 2023-11-10 DIAGNOSIS — R0789 Other chest pain: Secondary | ICD-10-CM

## 2023-11-10 NOTE — Progress Notes (Signed)
 Subjective:    Patient ID: Todd Watson, male    DOB: 1941-06-09, 83 y.o.   MRN: [id number]  HPI 83 year old male who  has a past medical history of Arthritis, GERD (gastroesophageal reflux disease), Herpes zoster, Hiatal hernia, Hypothyroidism, and Osteoarthritis of lumbar spine.  He presents to the office today for follow up after being seen in the ER two days ago   He went to the emergency room for evaluation of chest pain.  He reported at that time that he had had several episodes of very brief, short-lived sharp chest pain in the middle to left side chest starting earlier that day.  He has been splitting large pieces of wood by hand the day prior and when he woke up the next day he was driving and noticed that his left center chest was painful.  He was driving and had a couple instances where he developed sudden sharp chest pain in his left chest which prompted him to go to the emergency room.  Did not have any associated vomiting, diaphoresis, shortness of breath.  In the ER his CBC was unremarkable, BMP glucose 116 otherwise unremarkable, troponin x 2 was negative. EKG with sinus bradycardia with a rate of 58.  Was discharged since he did not seem to be having any reactive abnormality.  Reports that the next day he took Tylenol  since he was still having a little bit of discomfort and since then he has not had any pain.   Review of Systems See HPI   Past Medical History:  Diagnosis Date   Arthritis    GERD (gastroesophageal reflux disease)    Herpes zoster    Hiatal hernia    Hypothyroidism    Osteoarthritis of lumbar spine     Social History   Socioeconomic History   Marital status: Married    Spouse name: Not on file   Number of children: 3   Years of education: 10   Highest education level: GED or equivalent  Occupational History   Occupation: retired  Tobacco Use   Smoking status: Never   Smokeless tobacco: Never  Vaping Use   Vaping status: Never Used   Substance and Sexual Activity   Alcohol use: No   Drug use: No   Sexual activity: Yes  Other Topics Concern   Not on file  Social History Narrative   Lives with wife.    3 children     6 grandchildren & 5 great grandchildren   Likes to fish    Social Drivers of Health   Financial Resource Strain: Low Risk  (06/17/2023)   Overall Financial Resource Strain (CARDIA)    Difficulty of Paying Living Expenses: Not hard at all  Food Insecurity: No Food Insecurity (06/17/2023)   Hunger Vital Sign    Worried About Running Out of Food in the Last Year: Never true    Ran Out of Food in the Last Year: Never true  Transportation Needs: No Transportation Needs (06/17/2023)   PRAPARE - Administrator, Civil Service (Medical): No    Lack of Transportation (Non-Medical): No  Physical Activity: Inactive (06/17/2023)   Exercise Vital Sign    Days of Exercise per Week: 0 days    Minutes of Exercise per Session: 0 min  Stress: No Stress Concern Present (06/17/2023)   [employer] of Occupational Health - Occupational Stress Questionnaire    Feeling of Stress : Not at all  Social Connections: Socially Integrated (06/17/2023)  Social Advertising Account Executive [NHANES]    Frequency of Communication with Friends and Family: More than three times a week    Frequency of Social Gatherings with Friends and Family: More than three times a week    Attends Religious Services: More than 4 times per year    Active Member of Clubs or Organizations: Yes    Attends Banker Meetings: More than 4 times per year    Marital Status: Married  Catering Manager Violence: Not At Risk (06/17/2023)   Humiliation, Afraid, Rape, and Kick questionnaire    Fear of Current or Ex-Partner: No    Emotionally Abused: No    Physically Abused: No    Sexually Abused: No    Past Surgical History:  Procedure Laterality Date   APPENDECTOMY  2000   INGUINAL HERNIA REPAIR Bilateral    LUMBAR  LAMINECTOMY/DECOMPRESSION MICRODISCECTOMY Right 01/28/2020   Procedure: Microdiscectomy - right - Lumbar four-Lumbar five;  Surgeon: Onetha Kuba, MD;  Location: [facility] OR;  Service: Neurosurgery;  Laterality: Right;   SPINE SURGERY     spurs    Family History  Problem Relation Age of Onset   Heart disease Father        Valve disease   Kidney failure Mother     No Known Allergies  Current Outpatient Medications on File Prior to Visit  Medication Sig Dispense Refill   atorvastatin  (LIPITOR) 10 MG tablet Take 10 mg by mouth at bedtime.     blood glucose meter kit and supplies KIT Dispense based on patient and insurance preference. Use up to four times daily as directed. 1 each 0   Boswellia-Glucosamine-Vit D (OSTEO BI-FLEX ONE PER DAY PO) Take 1 tablet by mouth daily.     clotrimazole-betamethasone (LOTRISONE) cream SMARTSIG:1 Gram(s) Topical Daily PRN     diclofenac  Sodium (VOLTAREN ) 1 % GEL Apply 1 application  topically 4 (four) times daily as needed (pain).     gabapentin  (NEURONTIN ) 100 MG capsule Take 1 capsule (100 mg total) by mouth at bedtime. 30 capsule 1   levothyroxine  (SYNTHROID ) 88 MCG tablet Take 88 mcg by mouth daily before breakfast.     lisinopril  (PRINIVIL ,ZESTRIL ) 10 MG tablet Take 5 mg by mouth in the morning and at bedtime.      Magnesium 250 MG TABS Take by mouth daily.     metFORMIN  (GLUCOPHAGE ) 500 MG tablet Take by mouth daily with breakfast.     Multiple Vitamin (MULTIVITAMIN) tablet Take 1 tablet by mouth daily.     Omega-3 1000 MG CAPS Take 1,000 mg by mouth daily.     omeprazole  (PRILOSEC) 20 MG capsule Take 20 mg by mouth in the morning and at bedtime.     Probiotic Product (PROBIOTIC DAILY PO) Take 1 capsule by mouth daily.      psyllium (METAMUCIL) 58.6 % powder Take 1 packet by mouth daily as needed (constipation).     sildenafil  (VIAGRA ) 100 MG tablet Take by mouth.     tamsulosin  (FLOMAX ) 0.4 MG CAPS capsule Take 0.4 mg by mouth at bedtime.     vitamin C  (ASCORBIC ACID ) 500 MG tablet Take 500 mg by mouth daily.     No current facility-administered medications on file prior to visit.    Temp 98.1 F (36.7 C) (Oral)   Ht 5' 6.5 (1.689 m)   Wt 158 lb (71.7 kg)   BMI 25.12 kg/m       Objective:   Physical Exam Vitals and  nursing note reviewed.  Constitutional:      Appearance: Normal appearance. He is normal weight.  Cardiovascular:     Rate and Rhythm: Normal rate and regular rhythm.     Pulses: Normal pulses.     Heart sounds: Normal heart sounds.  Pulmonary:     Effort: Pulmonary effort is normal.     Breath sounds: Normal breath sounds.  Chest:     Chest wall: Tenderness present.       Comments: He did have some mild tenderness with deep palpation to left chest wall Skin:    General: Skin is warm and dry.  Neurological:     General: No focal deficit present.     Mental Status: He is alert.  Psychiatric:        Mood and Affect: Mood normal.        Behavior: Behavior normal.        Thought Content: Thought content normal.        Judgment: Judgment normal.       Assessment & Plan:  1. Atypical chest pain (Primary) -Does not seem to be cardiac related.  Appears to be more MSK from chopping wood at home.  - Follow up as needed  Darleene Shape, NP  Time spent with patient today was 32 minutes which consisted of chart review, discussing atypical chest pain, work up, treatment answering questions and documentation.

## 2023-11-15 DIAGNOSIS — Z860100 Personal history of colon polyps, unspecified: Secondary | ICD-10-CM | POA: Insufficient documentation

## 2024-01-26 DIAGNOSIS — L821 Other seborrheic keratosis: Secondary | ICD-10-CM | POA: Diagnosis not present

## 2024-01-26 DIAGNOSIS — L570 Actinic keratosis: Secondary | ICD-10-CM | POA: Diagnosis not present

## 2024-01-26 DIAGNOSIS — D229 Melanocytic nevi, unspecified: Secondary | ICD-10-CM | POA: Diagnosis not present

## 2024-01-26 DIAGNOSIS — L814 Other melanin hyperpigmentation: Secondary | ICD-10-CM | POA: Diagnosis not present

## 2024-02-02 ENCOUNTER — Emergency Department (HOSPITAL_BASED_OUTPATIENT_CLINIC_OR_DEPARTMENT_OTHER)

## 2024-02-02 ENCOUNTER — Ambulatory Visit (INDEPENDENT_AMBULATORY_CARE_PROVIDER_SITE_OTHER)

## 2024-02-02 ENCOUNTER — Emergency Department (HOSPITAL_BASED_OUTPATIENT_CLINIC_OR_DEPARTMENT_OTHER)
Admission: EM | Admit: 2024-02-02 | Discharge: 2024-02-02 | Disposition: A | Discharge status: Home / Self Care | Attending: Emergency Medicine | Admitting: Emergency Medicine

## 2024-02-02 ENCOUNTER — Ambulatory Visit (INDEPENDENT_AMBULATORY_CARE_PROVIDER_SITE_OTHER): Admitting: Adult Health

## 2024-02-02 ENCOUNTER — Encounter: Payer: Self-pay | Admitting: Adult Health

## 2024-02-02 ENCOUNTER — Encounter (HOSPITAL_BASED_OUTPATIENT_CLINIC_OR_DEPARTMENT_OTHER): Payer: Self-pay

## 2024-02-02 ENCOUNTER — Other Ambulatory Visit: Payer: Self-pay

## 2024-02-02 VITALS — BP 120/64 | HR 63 | Temp 98.1°F | Ht 66.5 in | Wt 154.0 lb

## 2024-02-02 DIAGNOSIS — M79642 Pain in left hand: Secondary | ICD-10-CM

## 2024-02-02 DIAGNOSIS — W208XXA Other cause of strike by thrown, projected or falling object, initial encounter: Secondary | ICD-10-CM | POA: Insufficient documentation

## 2024-02-02 DIAGNOSIS — S62522A Displaced fracture of distal phalanx of left thumb, initial encounter for closed fracture: Secondary | ICD-10-CM | POA: Insufficient documentation

## 2024-02-02 DIAGNOSIS — Z23 Encounter for immunization: Secondary | ICD-10-CM | POA: Insufficient documentation

## 2024-02-02 DIAGNOSIS — M79645 Pain in left finger(s): Secondary | ICD-10-CM | POA: Diagnosis present

## 2024-02-02 MED ORDER — TETANUS-DIPHTH-ACELL PERTUSSIS 5-2.5-18.5 LF-MCG/0.5 IM SUSY
0.5000 mL | PREFILLED_SYRINGE | Freq: Once | INTRAMUSCULAR | Status: AC
  Administered 2024-02-02: 0.5 mL via INTRAMUSCULAR
  Filled 2024-02-02: qty 0.5

## 2024-02-02 MED ORDER — HYDROCODONE-ACETAMINOPHEN 5-325 MG PO TABS: 1.0000 | ORAL_TABLET | Freq: Four times a day (QID) | ORAL | 0 refills | Status: DC | PRN

## 2024-02-02 MED ORDER — CEPHALEXIN 500 MG PO CAPS: 500.0000 mg | ORAL_CAPSULE | Freq: Four times a day (QID) | ORAL | 0 refills | Status: DC

## 2024-02-02 MED ORDER — LIDOCAINE HCL (PF) 1 % IJ SOLN
5.0000 mL | Freq: Once | INTRAMUSCULAR | Status: AC
  Administered 2024-02-02: 5 mL
  Filled 2024-02-02: qty 5

## 2024-02-02 NOTE — ED Provider Notes (Cosign Needed Addendum)
 [place] [facility] AT [facility] Provider Note   CSN: [id number] Arrival date & time: 02/02/24  1440     History  Chief Complaint  Patient presents with   Finger Injury    Todd Watson is a 83 y.o. male who presents to the ED for evaluation of thumb injury.  Reports that 2 days ago he was using a jack to attach a trailer to a car.  States that the jack fell striking his thumb.  Reports pain in his left thumb since then.  Was seen PCP today where he had x-rays that are concerning for possible fracture.  He was sent to ED by PCP. States pain 5 out of 10 currently.  States he been taking Tylenol .  Denies any history of injuries to left thumb.  HPI     Home Medications Prior to Admission medications   Medication Sig Start Date End Date Taking? Authorizing Provider  HYDROcodone -acetaminophen  (NORCO/VICODIN) 5-325 MG tablet Take 1 tablet by mouth every 6 (six) hours as needed for severe pain (pain score 7-10). 02/02/24  Yes Adel Aden, PA-C  atorvastatin  (LIPITOR) 10 MG tablet Take 10 mg by mouth at bedtime.    [provider]  blood glucose meter kit and supplies KIT Dispense based on patient and insurance preference. Use up to four times daily as directed. 09/02/22   Nafziger, Cory, NP  Boswellia-Glucosamine-Vit D (OSTEO BI-FLEX ONE PER DAY PO) Take 1 tablet by mouth daily.    [provider]  clotrimazole-betamethasone (LOTRISONE) cream SMARTSIG:1 Gram(s) Topical Daily PRN 08/06/23   [provider]  diclofenac  Sodium (VOLTAREN ) 1 % GEL Apply 1 application  topically 4 (four) times daily as needed (pain).    [provider]  gabapentin  (NEURONTIN ) 100 MG capsule Take 1 capsule (100 mg total) by mouth at bedtime. 04/21/22   Arnie Lao, MD  levothyroxine  (SYNTHROID ) 88 MCG tablet Take 88 mcg by mouth daily before breakfast.    [provider]  lisinopril  (PRINIVIL ,ZESTRIL ) 10 MG tablet Take 5 mg by  mouth in the morning and at bedtime.     [provider]  Magnesium 250 MG TABS Take by mouth daily.    [provider]  metFORMIN (GLUCOPHAGE) 500 MG tablet Take by mouth daily with breakfast.    [provider]  Multiple Vitamin (MULTIVITAMIN) tablet Take 1 tablet by mouth daily.    [provider]  Omega-3 1000 MG CAPS Take 1,000 mg by mouth daily.    [provider]  omeprazole (PRILOSEC) 20 MG capsule Take 20 mg by mouth in the morning and at bedtime.    [provider]  Probiotic Product (PROBIOTIC DAILY PO) Take 1 capsule by mouth daily.     [provider]  psyllium (METAMUCIL) 58.6 % powder Take 1 packet by mouth daily as needed (constipation).    [provider]  sildenafil  (VIAGRA ) 100 MG tablet Take by mouth. 12/09/22   [provider]  tamsulosin  (FLOMAX ) 0.4 MG CAPS capsule Take 0.4 mg by mouth at bedtime.    [provider]  vitamin C (ASCORBIC ACID ) 500 MG tablet Take 500 mg by mouth daily.    [provider]      Allergies    Patient has no known allergies.    Review of Systems   Review of Systems  Musculoskeletal:  Positive for arthralgias and myalgias.  All other systems reviewed and are negative.   Physical Exam Updated Vital  Signs BP (!) 167/79 (BP Location: Right Arm)   Pulse (!) 54   Temp 98.1 F (36.7 C) (Oral)   Resp 18   SpO2 97%  Physical Exam Vitals and nursing note reviewed.  Constitutional:      General: He is not in acute distress.    Appearance: He is well-developed.  HENT:     Head: Normocephalic and atraumatic.  Eyes:     Conjunctiva/sclera: Conjunctivae normal.  Cardiovascular:     Rate and Rhythm: Normal rate and regular rhythm.     Heart sounds: No murmur heard. Pulmonary:     Effort: Pulmonary effort is normal. No respiratory distress.     Breath sounds: Normal breath sounds.  Abdominal:     Palpations: Abdomen is soft.     Tenderness:  There is no abdominal tenderness.  Musculoskeletal:        General: No swelling.     Left hand: Swelling, deformity and tenderness present. Decreased range of motion.     Cervical back: Neck supple.     Comments: Patient with obvious deformity to left thumb.  Reduced range of motion secondary to swelling.  Cap refill intact, neurovascularly intact.  Skin:    General: Skin is warm and dry.     Capillary Refill: Capillary refill takes less than 2 seconds.  Neurological:     Mental Status: He is alert.  Psychiatric:        Mood and Affect: Mood normal.     ED Results / Procedures / Treatments   Labs (all labs ordered are listed, but only abnormal results are displayed) Labs Reviewed - No data to display  EKG None  Radiology DG Finger Thumb Left Result Date: 02/02/2024 CLINICAL DATA:  Left thumb trauma, reduction of fracture EXAM: LEFT THUMB 2+V COMPARISON:  02/02/2024 FINDINGS: Frontal, oblique, and lateral views of the left thumb are obtained. The comminuted intra-articular fracture of the first proximal phalanx seen previously is again noted, with near anatomic alignment. Diffuse soft tissue swelling. Multiple metallic radiopaque foreign bodies are seen adjacent to the first and second metacarpophalangeal joints, within the soft tissues between the first and second digit, and within the proximal first digit adjacent to the distal margin of the first proximal phalanx. IMPRESSION: 1. Comminuted intra-articular first proximal phalangeal fracture, with near anatomic alignment. 2. Diffuse soft tissue swelling, with multiple radiopaque foreign bodies as above. Electronically Signed   By: Bobbye Burrow M.D.   On: 02/02/2024 17:01   DG Hand Complete Left Result Date: 02/02/2024 CLINICAL DATA:  Left hand pain after injury with jack. EXAM: LEFT HAND - COMPLETE 3+ VIEW COMPARISON:  None Available. FINDINGS: Moderately displaced and comminuted fracture is seen involving the first proximal phalanx.  Metallic foreign bodies are seen in the soft tissues in the left thumb as well as adjacent to the second metacarpophalangeal joint. No significant arthropathy is noted. IMPRESSION: Moderately displaced and comminuted first proximal phalangeal fracture. Metallic foreign bodies are noted in the soft tissues of the thumb and adjacent to second metacarpophalangeal joint. Electronically Signed   By: Rosalene Colon M.D.   On: 02/02/2024 15:06    Procedures .Reduction of dislocation  Date/Time: 02/02/2024 5:22 PM  Performed by: Adel Aden, PA-C Authorized by: Adel Aden, PA-C  Consent: Verbal consent obtained. Consent given by: patient Patient understanding: patient states understanding of the procedure being performed Patient consent: the patient's understanding of the procedure matches consent given Procedure consent: procedure consent matches procedure scheduled  Patient identity confirmed: verbally with patient Time out: Immediately prior to procedure a "time out" was called to verify the correct patient, procedure, equipment, support staff and site/side marked as required. Preparation: Patient was prepped and draped in the usual sterile fashion. Local anesthesia used: yes Anesthesia: digital block  Anesthesia: Local anesthesia used: yes Local Anesthetic: lidocaine  1% without epinephrine   Sedation: Patient sedated: no  Patient tolerance: patient tolerated the procedure well with no immediate complications      Medications Ordered in ED Medications  lidocaine  (PF) (XYLOCAINE ) 1 % injection 5 mL (5 mLs Other Given 02/02/24 1532)  Tdap (BOOSTRIX ) injection 0.5 mL (0.5 mLs Intramuscular Given 02/02/24 1551)    ED Course/ Medical Decision Making/ A&P   Medical Decision Making Amount and/or Complexity of Data Reviewed Radiology: ordered.  Risk Prescription drug management.   83 year old who presents for evaluation.  Please see HPI for further details.  On  examination patient with obvious left thumb deformity.  Brisk cap refill, neurovascularly intact to left thumb.  2+ radial pulse.  Edema to base of left thumb with reduced range of motion.  Patient had outpatient films collected of left hand today.  These show, "Moderately displaced and comminuted first proximal phalangeal fracture. Metallic foreign bodies are noted in the soft tissues of the thumb and adjacent to second metacarpophalangeal joint.".  Patient left hand examined.  No obvious lacerations or open wounds that would enable foreign bodies to enter.  Suspect foreign bodies from old injury.  Patient reports that he has numerous hand injuries in the past, used to work in an Theatre stage manager.  Out of abundance of caution, we will update patient tetanus and cover with Keflex  on discharge.  Patient left thumb was reduced after ring block was performed.  Patient postreduction film collected which shows near-anatomic alignment of left thumb with a comminuted intra-articular first proximal phalangeal fracture.  Patient placed in thumb spica splint at this time.  He will follow-up with Dr. Huntley Mai of hand surgery.  He will be sent home with small amount of pain medication which was advised to only take at night before bed.  Advised of side effects of pain medication, encouraged never to drive or operate machinery on this medication and he voiced understanding.  Return precautions provided and he voiced understanding.  Stable to discharge home.   Final Clinical Impression(s) / ED Diagnoses Final diagnoses:  Closed displaced fracture of distal phalanx of left thumb, initial encounter    Rx / DC Orders ED Discharge Orders          Ordered    HYDROcodone -acetaminophen  (NORCO/VICODIN) 5-325 MG tablet  Every 6 hours PRN        02/02/24 1724                 Adel Aden, PA-C 02/02/24 1728    Scarlette Currier, MD 02/04/24 1243

## 2024-02-02 NOTE — ED Triage Notes (Signed)
 Patient's left thumb was hit when he was hooking up a trailer. It happened two days ago. It is swollen and he says it is painful to the touch.

## 2024-02-02 NOTE — Discharge Instructions (Addendum)
 It was a pleasure taking part in your care.  As discussed, your left thumb was reduced after being found to be dislocated.  You will follow-up with Dr. Kuzmin in the office.  Please call with Dr. Vernetta Gong office in the morning and make an appointment to be seen.  Please remain in the splint until seen by Dr. Huntley Mai.  Please take ibuprofen or Tylenol  every 6 hours as needed for pain.  Please take hydrocodone  pain medication if Tylenol  and ibuprofen do not relieve pain.  Please do not drive or operate machinery while taking hydrocodone  pain medication.  Please return to the ED with any new or worsening symptoms.  Please been taking Keflex  antibiotic 4 times a day for 5 days.

## 2024-02-02 NOTE — Progress Notes (Signed)
 Subjective:    Patient ID: Todd Watson, male    DOB: [birth year], 83 y.o.   MRN: [id number]  Arm Pain    83 year old male who  has a past medical history of Arthritis, GERD (gastroesophageal reflux disease), Herpes zoster, Hiatal hernia, Hypothyroidism, and Osteoarthritis of lumbar spine.  He presents to the office today for an acute left hand pain. He reports that two days ago he was using a jack with a handle. He was putting the jack down and was under weight, when the handle  of the jack hit him hard in the left hand at the base of the thumb. He has had swelling and pain since. He has loss of ROM of the left thumb.   Review of Systems See HPI   Past Medical History:  Diagnosis Date   Arthritis    GERD (gastroesophageal reflux disease)    Herpes zoster    Hiatal hernia    Hypothyroidism    Osteoarthritis of lumbar spine     Social History   Socioeconomic History   Marital status: Married    Spouse name: Not on file   Number of children: 3   Years of education: 10   Highest education level: GED or equivalent  Occupational History   Occupation: retired  Tobacco Use   Smoking status: Never   Smokeless tobacco: Never  Vaping Use   Vaping status: Never Used  Substance and Sexual Activity   Alcohol use: No   Drug use: No   Sexual activity: Yes  Other Topics Concern   Not on file  Social History Narrative   Lives with wife.    3 children     6 grandchildren & 5 great grandchildren   Likes to fish    Social Drivers of Health   Financial Resource Strain: Low Risk  (06/17/2023)   Overall Financial Resource Strain (CARDIA)    Difficulty of Paying Living Expenses: Not hard at all  Food Insecurity: No Food Insecurity (06/17/2023)   Hunger Vital Sign    Worried About Running Out of Food in the Last Year: Never true    Ran Out of Food in the Last Year: Never true  Transportation Needs: No Transportation Needs (06/17/2023)   PRAPARE - Scientist, research (physical sciences) (Medical): No    Lack of Transportation (Non-Medical): No  Physical Activity: Inactive (06/17/2023)   Exercise Vital Sign    Days of Exercise per Week: 0 days    Minutes of Exercise per Session: 0 min  Stress: No Stress Concern Present (06/17/2023)   [employer] of Occupational Health - Occupational Stress Questionnaire    Feeling of Stress : Not at all  Social Connections: Socially Integrated (06/17/2023)   Social Connection and Isolation Panel [NHANES]    Frequency of Communication with Friends and Family: More than three times a week    Frequency of Social Gatherings with Friends and Family: More than three times a week    Attends Religious Services: More than 4 times per year    Active Member of [employer] or Organizations: Yes    Attends Banker Meetings: More than 4 times per year    Marital Status: Married  Catering manager Violence: Not At Risk (06/17/2023)   Humiliation, Afraid, Rape, and Kick questionnaire    Fear of Current or Ex-Partner: No    Emotionally Abused: No    Physically Abused: No    Sexually Abused: No  Past Surgical History:  Procedure Laterality Date   APPENDECTOMY  2000   INGUINAL HERNIA REPAIR Bilateral    LUMBAR LAMINECTOMY/DECOMPRESSION MICRODISCECTOMY Right 01/28/2020   Procedure: Microdiscectomy - right - Lumbar four-Lumbar five;  Surgeon: Gearl Keens, MD;  Location[facility] OR;  Service: Neurosurgery;  Laterality: Right;   SPINE SURGERY     spurs    Family History  Problem Relation Age of Onset   Heart disease Father        Valve disease   Kidney failure Mother     No Known Allergies  Current Outpatient Medications on File Prior to Visit  Medication Sig Dispense Refill   atorvastatin  (LIPITOR) 10 MG tablet Take 10 mg by mouth at bedtime.     blood glucose meter kit and supplies KIT Dispense based on patient and insurance preference. Use up to four times daily as directed. 1 each 0   Boswellia-Glucosamine-Vit D (OSTEO  BI-FLEX ONE PER DAY PO) Take 1 tablet by mouth daily.     clotrimazole-betamethasone (LOTRISONE) cream SMARTSIG:1 Gram(s) Topical Daily PRN     diclofenac  Sodium (VOLTAREN ) 1 % GEL Apply 1 application  topically 4 (four) times daily as needed (pain).     gabapentin  (NEURONTIN ) 100 MG capsule Take 1 capsule (100 mg total) by mouth at bedtime. 30 capsule 1   levothyroxine  (SYNTHROID ) 88 MCG tablet Take 88 mcg by mouth daily before breakfast.     lisinopril  (PRINIVIL ,ZESTRIL ) 10 MG tablet Take 5 mg by mouth in the morning and at bedtime.      Magnesium 250 MG TABS Take by mouth daily.     metFORMIN (GLUCOPHAGE) 500 MG tablet Take by mouth daily with breakfast.     Multiple Vitamin (MULTIVITAMIN) tablet Take 1 tablet by mouth daily.     Omega-3 1000 MG CAPS Take 1,000 mg by mouth daily.     omeprazole (PRILOSEC) 20 MG capsule Take 20 mg by mouth in the morning and at bedtime.     Probiotic Product (PROBIOTIC DAILY PO) Take 1 capsule by mouth daily.      psyllium (METAMUCIL) 58.6 % powder Take 1 packet by mouth daily as needed (constipation).     sildenafil  (VIAGRA ) 100 MG tablet Take by mouth.     tamsulosin  (FLOMAX ) 0.4 MG CAPS capsule Take 0.4 mg by mouth at bedtime.     vitamin C (ASCORBIC ACID ) 500 MG tablet Take 500 mg by mouth daily.     No current facility-administered medications on file prior to visit.    BP 120/64   Pulse 63   Temp 98.1 F (36.7 C) (Oral)   Ht 5' 6.5" (1.689 m)   Wt 154 lb (69.9 kg)   SpO2 98%   BMI 24.48 kg/m       Objective:   Physical Exam Vitals and nursing note reviewed.  Constitutional:      Appearance: Normal appearance.  Musculoskeletal:     Right wrist: Normal.     Right hand: Swelling, tenderness and bony tenderness present. Decreased range of motion. Decreased strength.     Comments: Pain and swelling noted to MCP and  CMC joints   Skin:    General: Skin is warm and dry.  Neurological:     General: No focal deficit present.     Mental  Status: He is alert and oriented to person, place, and time.  Psychiatric:        Mood and Affect: Mood normal.  Behavior: Behavior normal.        Thought Content: Thought content normal.       Assessment & Plan:  1. Left hand pain (Primary) - There is concern for fracture. Will xray in the office today. Unable to splint in office. Will have him take NSAIDS and ice until we get xray back.  - DG Hand Complete Left; Future  Alto Atta, NP

## 2024-02-03 DIAGNOSIS — S62512A Displaced fracture of proximal phalanx of left thumb, initial encounter for closed fracture: Secondary | ICD-10-CM | POA: Diagnosis not present

## 2024-02-13 DIAGNOSIS — S62512A Displaced fracture of proximal phalanx of left thumb, initial encounter for closed fracture: Secondary | ICD-10-CM | POA: Diagnosis not present

## 2024-02-20 DIAGNOSIS — S62512A Displaced fracture of proximal phalanx of left thumb, initial encounter for closed fracture: Secondary | ICD-10-CM | POA: Diagnosis not present

## 2024-03-15 DIAGNOSIS — Z4789 Encounter for other orthopedic aftercare: Secondary | ICD-10-CM | POA: Diagnosis not present

## 2024-04-18 ENCOUNTER — Encounter: Payer: Self-pay | Admitting: Family Medicine

## 2024-04-18 ENCOUNTER — Ambulatory Visit: Admitting: Family Medicine

## 2024-04-18 VITALS — BP 120/70 | HR 60 | Resp 16 | Ht 66.5 in | Wt 154.2 lb

## 2024-04-18 DIAGNOSIS — S86811A Strain of other muscle(s) and tendon(s) at lower leg level, right leg, initial encounter: Secondary | ICD-10-CM | POA: Diagnosis not present

## 2024-04-18 DIAGNOSIS — R159 Full incontinence of feces: Secondary | ICD-10-CM | POA: Diagnosis not present

## 2024-04-18 MED ORDER — HYDROCODONE-ACETAMINOPHEN 5-325 MG PO TABS: 1.0000 | ORAL_TABLET | Freq: Two times a day (BID) | ORAL | 0 refills | Status: AC | PRN

## 2024-04-18 MED ORDER — DICLOFENAC SODIUM 1 % EX GEL: 2.0000 g | Freq: Four times a day (QID) | CUTANEOUS | 1 refills | Status: DC | PRN

## 2024-04-18 NOTE — Patient Instructions (Addendum)
 A few things to remember from today's visit:  Strain of calf muscle, right, initial encounter - Plan: diclofenac  Sodium (VOLTAREN ) 1 % GEL, HYDROcodone -acetaminophen  (NORCO/VICODIN) 5-325 MG tablet  Bloating symptom For gas you can try Beano or gas X. Monitor for leg swelling or redness as well for palpitations, chest pain,or difficulty breathing. Calf pain seems caused by a muscle strain, can even be a tear. Hydrocodone  can cause drowsiness and constipation among some, so be careful with fall. Topical Voltaren  3-4 times per day. Range of motion exercises as I showed you.  If you need refills for medications you take chronically, please call your pharmacy. Do not use My Chart to request refills or for acute issues that need immediate attention. If you send a my chart message, it may take a few days to be addressed, specially if I am not in the office.  Please be sure medication list is accurate. If a new problem present, please set up appointment sooner than planned today.

## 2024-04-18 NOTE — Progress Notes (Signed)
 ACUTE VISIT Chief Complaint  Patient presents with   Back Pain    Right side down to leg, was helping his wife back onto the bed   HPI: Todd Watson is a 83 y.o. male, a patient of Darleene Shape, NP, who was unavailable today, with a PMHx significant for HTN, GERD, Hypothyroidism, DM 2, and HLD, who is here today complaining of right calf pain.  He says that pain started yesterday, immediately after catching his wife while she was falling. Leg Pain  The incident occurred 12 to 24 hours ago. The incident occurred at home. There was no injury mechanism. The pain is present in the right leg. The pain is severe. The pain has been Intermittent since onset. Pertinent negatives include no inability to bear weight, loss of motion, loss of sensation or muscle weakness. The symptoms are aggravated by movement. He has tried heat and acetaminophen  for the symptoms. The treatment provided mild relief.   Rates pain at 8/10, has been taking ES Tylenol , and soon after pain started he took a warm bath, which also relieved his pain. He has hx of lower back pain but states that it has been stable.  Denies pain on rest, numbness, tingling, chest pain,palpitations, or difficulty breathing. He has not noted edema, ecchymosis,or erythema. No hx of DVT, recent surgical procedure,or travel.  -He c/o of excessive gassiness and stool urgency which occurs shortly after eating food with fecal incontinence sometimes. This problem has been going on for sometimes and according to pt, PCP recommended OTC medication but he does not recall name. Negative for abdominal pain, nausea, vomiting, blood in the stool, or melena. He history of cholelithiasis and nephrolithiasis.  Review of Systems  Constitutional:  Positive for activity change. Negative for appetite change, chills, fever and unexpected weight change.  Respiratory:  Negative for cough and wheezing.   Cardiovascular:  Negative for leg swelling.   Gastrointestinal:        (+) Stool Urgency (+) Flatulence  Endocrine: Negative for cold intolerance and heat intolerance.  Genitourinary:  Negative for decreased urine volume, dysuria and hematuria.  Musculoskeletal:  Positive for arthralgias, back pain and gait problem.  Skin:  Negative for rash.  Neurological:  Negative for syncope and weakness.  See other pertinent positives and negatives in HPI.  Current Outpatient Medications on File Prior to Visit  Medication Sig Dispense Refill   atorvastatin  (LIPITOR) 10 MG tablet Take 10 mg by mouth at bedtime.     blood glucose meter kit and supplies KIT Dispense based on patient and insurance preference. Use up to four times daily as directed. 1 each 0   Boswellia-Glucosamine-Vit D (OSTEO BI-FLEX ONE PER DAY PO) Take 1 tablet by mouth daily.     cephALEXin  (KEFLEX ) 500 MG capsule Take 1 capsule (500 mg total) by mouth 4 (four) times daily. 20 capsule 0   clotrimazole-betamethasone (LOTRISONE) cream SMARTSIG:1 Gram(s) Topical Daily PRN     gabapentin  (NEURONTIN ) 100 MG capsule Take 1 capsule (100 mg total) by mouth at bedtime. 30 capsule 1   levothyroxine  (SYNTHROID ) 88 MCG tablet Take 88 mcg by mouth daily before breakfast.     lisinopril  (PRINIVIL ,ZESTRIL ) 10 MG tablet Take 5 mg by mouth in the morning and at bedtime.      Magnesium 250 MG TABS Take by mouth daily.     metFORMIN (GLUCOPHAGE) 500 MG tablet Take by mouth daily with breakfast.     Multiple Vitamin (MULTIVITAMIN) tablet Take 1 tablet by  mouth daily.     Omega-3 1000 MG CAPS Take 1,000 mg by mouth daily.     omeprazole (PRILOSEC) 20 MG capsule Take 20 mg by mouth in the morning and at bedtime.     Probiotic Product (PROBIOTIC DAILY PO) Take 1 capsule by mouth daily.      psyllium (METAMUCIL) 58.6 % powder Take 1 packet by mouth daily as needed (constipation).     sildenafil  (VIAGRA ) 100 MG tablet Take by mouth.     tamsulosin  (FLOMAX ) 0.4 MG CAPS capsule Take 0.4 mg by mouth at  bedtime.     vitamin C (ASCORBIC ACID ) 500 MG tablet Take 500 mg by mouth daily.     No current facility-administered medications on file prior to visit.   Past Medical History:  Diagnosis Date   Arthritis    GERD (gastroesophageal reflux disease)    Herpes zoster    Hiatal hernia    Hypothyroidism    Osteoarthritis of lumbar spine    No Known Allergies  Social History   Socioeconomic History   Marital status: Married    Spouse name: Not on file   Number of children: 3   Years of education: 10   Highest education level: GED or equivalent  Occupational History   Occupation: retired  Tobacco Use   Smoking status: Never   Smokeless tobacco: Never  Vaping Use   Vaping status: Never Used  Substance and Sexual Activity   Alcohol use: No   Drug use: No   Sexual activity: Yes  Other Topics Concern   Not on file  Social History Narrative   Lives with wife.    3 children     6 grandchildren & 5 great grandchildren   Likes to fish    Social Drivers of Health   Financial Resource Strain: Low Risk  (06/17/2023)   Overall Financial Resource Strain (CARDIA)    Difficulty of Paying Living Expenses: Not hard at all  Food Insecurity: Low Risk  (02/03/2024)   Received from [facility]   Hunger Vital Sign    Within the past 12 months, you worried that your food would run out before you got money to buy more: Never true    Within the past 12 months, the food you bought just didn't last and you didn't have money to get more. : Never true  Transportation Needs: No Transportation Needs (02/03/2024)   Received from [facility]    In the past 12 months, has lack of reliable transportation kept you from medical appointments, meetings, work or from getting things needed for daily living? : No  Physical Activity: Inactive (06/17/2023)   Exercise Vital Sign    Days of Exercise per Week: 0 days    Minutes of Exercise per Session: 0 min  Stress: No Stress Concern Present  (06/17/2023)   [employer] of Occupational Health - Occupational Stress Questionnaire    Feeling of Stress : Not at all  Social Connections: Socially Integrated (06/17/2023)   Social Connection and Isolation Panel    Frequency of Communication with Friends and Family: More than three times a week    Frequency of Social Gatherings with Friends and Family: More than three times a week    Attends Religious Services: More than 4 times per year    Active Member of [employer] or Organizations: Yes    Attends Banker Meetings: More than 4 times per year    Marital Status: Married  Vitals:   04/18/24 0806  BP: 120/70  Pulse: 60  Resp: 16  SpO2: 97%   Body mass index is 24.52 kg/m.  Physical Exam Vitals and nursing note reviewed.  Constitutional:      General: He is not in acute distress.    Appearance: He is well-developed. He is not ill-appearing.  HENT:     Head: Normocephalic and atraumatic.  Eyes:     Conjunctiva/sclera: Conjunctivae normal.  Cardiovascular:     Rate and Rhythm: Regular rhythm.     Pulses:          Dorsalis pedis pulses are 2+ on the right side.       Posterior tibial pulses are 2+ on the right side.     Heart sounds: Murmur (? soft SEM RUSB) heard.  Pulmonary:     Effort: Pulmonary effort is normal. No respiratory distress.  Abdominal:     Palpations: Abdomen is soft.     Tenderness: There is no abdominal tenderness.  Musculoskeletal:     Right lower leg: Tenderness present. No deformity, lacerations or bony tenderness. No edema.     Left lower leg: No edema.     Right ankle: No tenderness.     Right Achilles Tendon: Thompson's test negative.       Legs:  Skin:    General: Skin is warm.     Findings: No erythema or rash.  Neurological:     Mental Status: He is alert and oriented to person, place, and time.     Comments: Antalgic gait, not assisted.   ASSESSMENT AND PLAN: Mr.Benji W Gahm was seen here today for Right Calf Pain.    Strain of calf muscle, right, initial encounter We discussed differential diagnosis, history and examination do not suggest a serious process, I do not think imaging is needed at this time. Monitor for new symptoms like worsening pain, edema, or erythema as well as CP, palpitation, and shortness of breath; in which case he was instructed to seek immediate medical attention. Topical Voltaren  3-4 times per day and hydrocodone -acetaminophen  5-325 mg twice daily as needed, which he has taken in the past with no side effects. Follow-up with PCP in 10 days, before if needed.  -     Diclofenac  Sodium; Apply 2 g topically 4 (four) times daily as needed (pain).  Dispense: 150 g; Refill: 1 -     HYDROcodone -Acetaminophen ; Take 1 tablet by mouth every 12 (twelve) hours as needed for up to 5 days for severe pain (pain score 7-10).  Dispense: 10 tablet; Refill: 0  Incontinence of feces, unspecified fecal incontinence type With associated flatulence. Could be caused by constipation. He had abdominal x-ray in 12/2023, which showed moderate to large stool in ascending colon. I do not think imaging is needed today. He can try OTC Gas-X or Beano to help with flatulence. Adequate hydration and fiber intake. Instructed about warning signs. Follow-up with PCP as needed.  Return in about 10 days (around 04/28/2024) for calf pain with PCP.  I,Emily Lagle,acting as a Neurosurgeon for Laketa Sandoz [place], MD.,have documented all relevant documentation on the behalf of Norabelle Kondo [place], MD,as directed by  Sakiya Stepka [place], MD while in the presence of Donis Pinder [place], MD.  I, Keldrick Pomplun [place], MD, have reviewed all documentation for this visit. The documentation on 04/18/24 for the exam, diagnosis, procedures, and orders are all accurate and complete. Jendayi Berling G. [place], MD  [facility]. [place] office.

## 2024-06-22 ENCOUNTER — Ambulatory Visit

## 2024-06-22 VITALS — Ht 66.5 in | Wt 152.0 lb

## 2024-06-22 DIAGNOSIS — Z0000 Encounter for general adult medical examination without abnormal findings: Secondary | ICD-10-CM

## 2024-06-22 NOTE — Patient Instructions (Addendum)
 Todd Watson,  Thank you for taking the time for your Medicare Wellness Visit. I appreciate your continued commitment to your health goals. Please review the care plan we discussed, and feel free to reach out if I can assist you further.  Medicare recommends these wellness visits once per year to help you and your care team stay ahead of potential health issues. These visits are designed to focus on prevention, allowing your provider to concentrate on managing your acute and chronic conditions during your regular appointments.  Please note that Annual Wellness Visits do not include a physical exam. Some assessments may be limited, especially if the visit was conducted virtually. If needed, we may recommend a separate in-person follow-up with your provider.  Ongoing Care Seeing your primary care provider every 3 to 6 months helps us  monitor your health and provide consistent, personalized care.   Referrals If a referral was made during today's visit and you haven't received any updates within two weeks, please contact the referred provider directly to check on the status.  Recommended Screenings:  Health Maintenance  Topic Date Due   Yearly kidney health urinalysis for diabetes  Never done   Complete foot exam   02/10/2023   Eye exam for diabetics  02/17/2023   Hemoglobin A1C  02/22/2024   Flu Shot  05/04/2024   COVID-19 Vaccine (7 - 2025-26 season) 06/04/2024   Yearly kidney function blood test for diabetes  11/07/2024   Medicare Annual Wellness Visit  06/22/2025   DTaP/Tdap/Td vaccine (5 - Td or Tdap) 02/01/2034   Pneumococcal Vaccine for age over 91  Completed   Zoster (Shingles) Vaccine  Completed   HPV Vaccine  Aged Out   Meningitis B Vaccine  Aged Out       06/22/2024    8:56 AM  Advanced Directives  Does Patient Have a Medical Advance Directive? No  Would patient like information on creating a medical advance directive? No - Patient declined   Advance Care Planning is  important because it: Ensures you receive medical care that aligns with your values, goals, and preferences. Provides guidance to your family and loved ones, reducing the emotional burden of decision-making during critical moments.  Vision: Annual vision screenings are recommended for early detection of glaucoma, cataracts, and diabetic retinopathy. These exams can also reveal signs of chronic conditions such as diabetes and high blood pressure.  Dental: Annual dental screenings help detect early signs of oral cancer, gum disease, and other conditions linked to overall health, including heart disease and diabetes.  Please see the attached documents for additional preventive care recommendations.

## 2024-06-22 NOTE — Progress Notes (Signed)
 Subjective:   Todd Watson is a 83 y.o. who presents for a Medicare Wellness preventive visit.  As a reminder, Annual Wellness Visits don't include a physical exam, and some assessments may be limited, especially if this visit is performed virtually. We may recommend an in-person follow-up visit with your provider if needed.  Visit Complete: Virtual I connected with  Todd Watson on 06/22/24 by a audio enabled telemedicine application and verified that I am speaking with the correct person using two identifiers.  Patient Location: Home  Provider Location: Home Office  I discussed the limitations of evaluation and management by telemedicine. The patient expressed understanding and agreed to proceed.  Vital Signs: Because this visit was a virtual/telehealth visit, some criteria may be missing or patient reported. Any vitals not documented were not able to be obtained and vitals that have been documented are patient reported.    Persons Participating in Visit: Patient.  AWV Questionnaire: No: Patient Medicare AWV questionnaire was not completed prior to this visit.  Cardiac Risk Factors include: advanced age (>38men, >93 women);hypertension     Objective:    Today's Vitals   06/22/24 0848  Weight: 152 lb (68.9 kg)  Height: 5' 6.5 (1.689 m)   Body mass index is 24.17 kg/m.     06/22/2024    8:56 AM 02/02/2024    2:49 PM 06/17/2023   10:02 AM 06/08/2022   10:10 AM 10/01/2021    7:54 PM 06/02/2021   10:37 AM 10/12/2019   12:28 PM  Advanced Directives  Does Patient Have a Medical Advance Directive? No No No No No No No  Would patient like information on creating a medical advance directive? No - Patient declined  No - Patient declined No - Patient declined  No - Patient declined No - Patient declined    Current Medications (verified) Outpatient Encounter Medications as of 06/22/2024  Medication Sig   atorvastatin  (LIPITOR) 10 MG tablet Take 10 mg by mouth at bedtime.    blood glucose meter kit and supplies KIT Dispense based on patient and insurance preference. Use up to four times daily as directed.   Boswellia-Glucosamine-Vit D (OSTEO BI-FLEX ONE PER DAY PO) Take 1 tablet by mouth daily.   cephALEXin  (KEFLEX ) 500 MG capsule Take 1 capsule (500 mg total) by mouth 4 (four) times daily. (Patient not taking: Reported on 06/22/2024)   clotrimazole-betamethasone (LOTRISONE) cream SMARTSIG:1 Gram(s) Topical Daily PRN   diclofenac  Sodium (VOLTAREN ) 1 % GEL Apply 2 g topically 4 (four) times daily as needed (pain).   gabapentin  (NEURONTIN ) 100 MG capsule Take 1 capsule (100 mg total) by mouth at bedtime.   levothyroxine  (SYNTHROID ) 88 MCG tablet Take 88 mcg by mouth daily before breakfast.   lisinopril  (PRINIVIL ,ZESTRIL ) 10 MG tablet Take 5 mg by mouth in the morning and at bedtime.    Magnesium 250 MG TABS Take by mouth daily.   metFORMIN (GLUCOPHAGE) 500 MG tablet Take by mouth daily with breakfast.   Multiple Vitamin (MULTIVITAMIN) tablet Take 1 tablet by mouth daily.   Omega-3 1000 MG CAPS Take 1,000 mg by mouth daily.   omeprazole (PRILOSEC) 20 MG capsule Take 20 mg by mouth in the morning and at bedtime.   Probiotic Product (PROBIOTIC DAILY PO) Take 1 capsule by mouth daily.    psyllium (METAMUCIL) 58.6 % powder Take 1 packet by mouth daily as needed (constipation).   sildenafil  (VIAGRA ) 100 MG tablet Take by mouth.   tamsulosin  (FLOMAX ) 0.4 MG CAPS  capsule Take 0.4 mg by mouth at bedtime.   vitamin C (ASCORBIC ACID ) 500 MG tablet Take 500 mg by mouth daily.   No facility-administered encounter medications on file as of 06/22/2024.    Allergies (verified) Patient has no known allergies.   History: Past Medical History:  Diagnosis Date   Arthritis    GERD (gastroesophageal reflux disease)    Herpes zoster    Hiatal hernia    Hypothyroidism    Osteoarthritis of lumbar spine    Past Surgical History:  Procedure Laterality Date   APPENDECTOMY  2000    INGUINAL HERNIA REPAIR Bilateral    LUMBAR LAMINECTOMY/DECOMPRESSION MICRODISCECTOMY Right 01/28/2020   Procedure: Microdiscectomy - right - Lumbar four-Lumbar five;  Surgeon: Onetha Kuba, MD;  Location: [facility] OR;  Service: Neurosurgery;  Laterality: Right;   SPINE SURGERY     spurs   Family History  Problem Relation Age of Onset   Heart disease Father        Valve disease   Kidney failure Mother    Social History   Socioeconomic History   Marital status: Married    Spouse name: Not on file   Number of children: 3   Years of education: 10   Highest education level: GED or equivalent  Occupational History   Occupation: retired  Tobacco Use   Smoking status: Never   Smokeless tobacco: Never  Vaping Use   Vaping status: Never Used  Substance and Sexual Activity   Alcohol use: No   Drug use: No   Sexual activity: Yes  Other Topics Concern   Not on file  Social History Narrative   Lives with wife.    3 children     6 grandchildren & 5 great grandchildren   Likes to fish    Social Drivers of Health   Financial Resource Strain: Low Risk  (06/22/2024)   Overall Financial Resource Strain (CARDIA)    Difficulty of Paying Living Expenses: Not hard at all  Food Insecurity: No Food Insecurity (06/22/2024)   Hunger Vital Sign    Worried About Running Out of Food in the Last Year: Never true    Ran Out of Food in the Last Year: Never true  Transportation Needs: No Transportation Needs (06/22/2024)   PRAPARE - Administrator, Civil Service (Medical): No    Lack of Transportation (Non-Medical): No  Physical Activity: Inactive (06/22/2024)   Exercise Vital Sign    Days of Exercise per Week: 0 days    Minutes of Exercise per Session: 0 min  Stress: No Stress Concern Present (06/22/2024)   [employer] of Occupational Health - Occupational Stress Questionnaire    Feeling of Stress: Not at all  Social Connections: Socially Integrated (06/22/2024)   Social Connection and  Isolation Panel    Frequency of Communication with Friends and Family: More than three times a week    Frequency of Social Gatherings with Friends and Family: More than three times a week    Attends Religious Services: More than 4 times per year    Active Member of [employer] or Organizations: Yes    Attends Engineer, structural: More than 4 times per year    Marital Status: Married    Tobacco Counseling Counseling given: Not Answered    Clinical Intake:  Pre-visit preparation completed: Yes  Pain : No/denies pain     BMI - recorded: 24.17 Nutritional Status: BMI of 19-24  Normal Nutritional Risks: None Diabetes: No  Lab Results  Component Value Date   HGBA1C 6.7 (H) 08/25/2023   HGBA1C 6.1 (A) 09/07/2022   HGBA1C 6.3 (A) 06/23/2021     How often do you need to have someone help you when you read instructions, pamphlets, or other written materials from your doctor or pharmacy?: 1 - Never  Interpreter Needed?: No  Information entered by :: Rojelio Blush LPN   Activities of Daily Living     06/22/2024    8:54 AM  In your present state of health, do you have any difficulty performing the following activities:  Hearing? 1  Comment Wears Hearing Aids  Vision? 0  Difficulty concentrating or making decisions? 0  Walking or climbing stairs? 0  Dressing or bathing? 0  Doing errands, shopping? 0  Preparing Food and eating ? N  Using the Toilet? N  In the past six months, have you accidently leaked urine? N  Do you have problems with loss of bowel control? N  Managing your Medications? N  Managing your Finances? N  Housekeeping or managing your Housekeeping? N    Patient Care Team: Merna Huxley, NP as PCP - General (Family Medicine) Onetha Kuba, MD as Consulting Physician (Neurosurgery) Liane Sharyne MATSU, [facility] (Inactive) as Pharmacist (Pharmacist)  I have updated your Care Teams any recent Medical Services you may have received from other providers in the  past year.     Assessment:   This is a routine wellness examination for Samari.  Hearing/Vision screen Hearing Screening - Comments:: Wears Hearing Aids Vision Screening - Comments:: Wears rx glasses - up to date with routine eye exams with  [facility]   Goals Addressed               This Visit's Progress     Remain active (pt-stated)         Depression Screen     06/22/2024    8:53 AM 06/17/2023    9:44 AM 07/30/2022    2:22 PM 06/08/2022   10:13 AM 02/09/2022    7:15 AM 10/02/2021    1:46 PM 06/02/2021   10:35 AM  PHQ 2/9 Scores  PHQ - 2 Score 0 0 0 0 0 0 0  PHQ- 9 Score     0 2     Fall Risk     06/22/2024    8:55 AM 06/17/2023   10:01 AM 04/28/2023    4:23 PM 06/08/2022   10:13 AM 02/09/2022    7:16 AM  Fall Risk   Falls in the past year? 0 0 0 0 0  Number falls in past yr: 0 0 0 0 0  Injury with Fall? 0 0 0 0 0  Risk for fall due to : No Fall Risks No Fall Risks No Fall Risks Medication side effect   Follow up Falls evaluation completed Falls prevention discussed Falls evaluation completed Falls prevention discussed;Education provided;Falls evaluation completed       Data saved with a previous flowsheet row definition    MEDICARE RISK AT HOME:  Medicare Risk at Home Any stairs in or around the home?: No If so, are there any without handrails?: No Home free of loose throw rugs in walkways, pet beds, electrical cords, etc?: Yes Adequate lighting in your home to reduce risk of falls?: Yes Life alert?: No Use of a cane, walker or w/c?: No Grab bars in the bathroom?: Yes Shower chair or bench in shower?: Yes Elevated toilet seat or a handicapped toilet?:  No  TIMED UP AND GO:  Was the test performed?  No  Cognitive Function: 6CIT completed    09/04/2018    8:53 AM  MMSE - Mini Mental State Exam  Not completed: --        06/22/2024    8:56 AM 06/17/2023   10:02 AM 06/08/2022   10:16 AM  6CIT Screen  What Year? 0 points 0 points 0 points  What  month? 0 points 0 points 0 points  What time? 0 points 0 points 0 points  Count back from 20 2 points 0 points 4 points  Months in reverse 2 points 4 points 4 points  Repeat phrase 0 points 0 points 4 points  Total Score 4 points 4 points 12 points    Immunizations Immunization History  Administered Date(s) Administered   Fluad Quad(high Dose 65+) 06/27/2020, 06/23/2021, 09/07/2022   Fluad Trivalent(High Dose 65+) 08/16/2018, 07/28/2023   Fluzone Influenza virus vaccine,trivalent (IIV3), split virus 07/04/2014   INFLUENZA, HIGH DOSE SEASONAL PF 07/05/2015, 07/31/2015, 07/04/2017   Influenza, Seasonal, Injecte, Preservative Fre 07/21/2010, 01/10/2012, 07/14/2016   Influenza,inj,Quad PF,6+ Mos 07/04/2014, 08/05/2019   Influenza-Unspecified 07/20/2000, 08/15/2001, 08/20/2002, 09/19/2003, 07/28/2004, 08/04/2005, 08/05/2006, 08/18/2007, 09/04/2008, 07/04/2009, 07/04/2013, 08/21/2018, 07/05/2019, 07/04/2021   Moderna Sars-Covid-2 Vaccination 10/24/2019, 11/21/2019, 02/04/2020, 03/03/2020, 09/16/2020, 11/06/2020   PNEUMOCOCCAL CONJUGATE-20 06/10/2021   Pneumococcal Conjugate-13 07/04/2014   Pneumococcal Polysaccharide-23 08/15/2001, 02/01/2010   Td (Adult),unspecified 07/16/2022   Tdap 01/10/2012, 07/04/2012, 02/02/2024   Zoster Recombinant(Shingrix) 04/03/2017, 04/27/2017, 07/04/2017   Zoster, Live 04/23/2011    Screening Tests Health Maintenance  Topic Date Due   Diabetic kidney evaluation - Urine ACR  Never done   FOOT EXAM  02/10/2023   OPHTHALMOLOGY EXAM  02/17/2023   HEMOGLOBIN A1C  02/22/2024   Influenza Vaccine  05/04/2024   COVID-19 Vaccine (7 - 2025-26 season) 06/04/2024   Diabetic kidney evaluation - eGFR measurement  11/07/2024   Medicare Annual Wellness (AWV)  06/22/2025   DTaP/Tdap/Td (5 - Td or Tdap) 02/01/2034   Pneumococcal Vaccine: 50+ Years  Completed   Zoster Vaccines- Shingrix  Completed   HPV VACCINES  Aged Out   Meningococcal B Vaccine  Aged Out    Health  Maintenance Items Addressed:   Additional Screening:  Vision Screening: Recommended annual ophthalmology exams for early detection of glaucoma and other disorders of the eye. Is the patient up to date with their annual eye exam?  Yes  Who is the provider or what is the name of the office in which the patient attends annual eye exams? [facility]  Dental Screening: Recommended annual dental exams for proper oral hygiene  Community Resource Referral / Chronic Care Management: CRR required this visit?  No   CCM required this visit?  No   Plan:    I have personally reviewed and noted the following in the patient's chart:   Medical and social history Use of alcohol, tobacco or illicit drugs  Current medications and supplements including opioid prescriptions. Patient is not currently taking opioid prescriptions. Functional ability and status Nutritional status Physical activity Advanced directives List of other physicians Hospitalizations, surgeries, and ER visits in previous 12 months Vitals Screenings to include cognitive, depression, and falls Referrals and appointments  In addition, I have reviewed and discussed with patient certain preventive protocols, quality metrics, and best practice recommendations. A written personalized care plan for preventive services as well as general preventive health recommendations were provided to patient.   Rojelio LELON Blush, LPN  06/22/2024   After Visit Summary: (MyChart) Due to this being a telephonic visit, the after visit summary with patients personalized plan was offered to patient via MyChart   Notes: Nothing significant to report at this time.

## 2024-07-26 ENCOUNTER — Other Ambulatory Visit: Payer: Self-pay | Admitting: Adult Health

## 2024-07-26 ENCOUNTER — Observation Stay (HOSPITAL_COMMUNITY)

## 2024-07-26 ENCOUNTER — Emergency Department (HOSPITAL_COMMUNITY)

## 2024-07-26 ENCOUNTER — Observation Stay (HOSPITAL_COMMUNITY)
Admission: EM | Admit: 2024-07-26 | Discharge: 2024-07-27 | Disposition: A | Discharge status: Home / Self Care | Attending: Internal Medicine | Admitting: Internal Medicine

## 2024-07-26 ENCOUNTER — Telehealth: Payer: Self-pay | Admitting: Adult Health

## 2024-07-26 ENCOUNTER — Encounter (HOSPITAL_COMMUNITY): Payer: Self-pay

## 2024-07-26 DIAGNOSIS — R001 Bradycardia, unspecified: Secondary | ICD-10-CM | POA: Diagnosis not present

## 2024-07-26 DIAGNOSIS — R4701 Aphasia: Secondary | ICD-10-CM | POA: Diagnosis not present

## 2024-07-26 DIAGNOSIS — I10 Essential (primary) hypertension: Secondary | ICD-10-CM | POA: Insufficient documentation

## 2024-07-26 DIAGNOSIS — R2990 Unspecified symptoms and signs involving the nervous system: Secondary | ICD-10-CM | POA: Diagnosis not present

## 2024-07-26 DIAGNOSIS — E039 Hypothyroidism, unspecified: Secondary | ICD-10-CM | POA: Insufficient documentation

## 2024-07-26 DIAGNOSIS — N400 Enlarged prostate without lower urinary tract symptoms: Secondary | ICD-10-CM | POA: Diagnosis not present

## 2024-07-26 DIAGNOSIS — E785 Hyperlipidemia, unspecified: Secondary | ICD-10-CM | POA: Insufficient documentation

## 2024-07-26 DIAGNOSIS — E119 Type 2 diabetes mellitus without complications: Secondary | ICD-10-CM | POA: Insufficient documentation

## 2024-07-26 DIAGNOSIS — R4781 Slurred speech: Secondary | ICD-10-CM

## 2024-07-26 DIAGNOSIS — G459 Transient cerebral ischemic attack, unspecified: Principal | ICD-10-CM

## 2024-07-26 DIAGNOSIS — I152 Hypertension secondary to endocrine disorders: Secondary | ICD-10-CM | POA: Diagnosis present

## 2024-07-26 DIAGNOSIS — E1169 Type 2 diabetes mellitus with other specified complication: Secondary | ICD-10-CM | POA: Diagnosis present

## 2024-07-26 DIAGNOSIS — R410 Disorientation, unspecified: Secondary | ICD-10-CM | POA: Diagnosis present

## 2024-07-26 DIAGNOSIS — E1159 Type 2 diabetes mellitus with other circulatory complications: Secondary | ICD-10-CM | POA: Diagnosis present

## 2024-07-26 LAB — URINALYSIS, ROUTINE W REFLEX MICROSCOPIC
Bacteria, UA: NONE SEEN
Bilirubin Urine: NEGATIVE
Glucose, UA: NEGATIVE mg/dL
Hgb urine dipstick: NEGATIVE
Ketones, ur: NEGATIVE mg/dL
Nitrite: NEGATIVE
Protein, ur: NEGATIVE mg/dL
Specific Gravity, Urine: 1.008 (ref 1.005–1.030)
pH: 7 (ref 5.0–8.0)

## 2024-07-26 LAB — COMPREHENSIVE METABOLIC PANEL WITH GFR
ALT: 22 U/L (ref 0–44)
AST: 30 U/L (ref 15–41)
Albumin: 3.9 g/dL (ref 3.5–5.0)
Alkaline Phosphatase: 49 U/L (ref 38–126)
Anion gap: 12 (ref 5–15)
BUN: 10 mg/dL (ref 8–23)
CO2: 23 mmol/L (ref 22–32)
Calcium: 8.4 mg/dL — ABNORMAL LOW (ref 8.9–10.3)
Chloride: 105 mmol/L (ref 98–111)
Creatinine, Ser: 0.86 mg/dL (ref 0.61–1.24)
GFR, Estimated: >60 mL/min (ref >60)
Glucose, Bld: 111 mg/dL — ABNORMAL HIGH (ref 70–99)
Potassium: 3.9 mmol/L (ref 3.5–5.1)
Sodium: 140 mmol/L (ref 135–145)
Total Bilirubin: 0.5 mg/dL (ref 0.0–1.2)
Total Protein: 6.4 g/dL — ABNORMAL LOW (ref 6.5–8.1)

## 2024-07-26 LAB — CBC
HCT: 38.5 % — ABNORMAL LOW (ref 39.0–52.0)
Hemoglobin: 12.8 g/dL — ABNORMAL LOW (ref 13.0–17.0)
MCH: 31.6 pg (ref 26.0–34.0)
MCHC: 33.2 g/dL (ref 30.0–36.0)
MCV: 95.1 fL (ref 80.0–100.0)
Platelets: 161 K/uL (ref 150–400)
RBC: 4.05 MIL/uL — ABNORMAL LOW (ref 4.22–5.81)
RDW: 13.7 % (ref 11.5–15.5)
WBC: 5.4 K/uL (ref 4.0–10.5)
nRBC: 0 % (ref 0.0–0.2)

## 2024-07-26 LAB — CBG MONITORING, ED: Glucose-Capillary: 107 mg/dL — ABNORMAL HIGH (ref 70–99)

## 2024-07-26 MED ORDER — ONDANSETRON HCL 4 MG/2ML IJ SOLN: 4.0000 mg | Freq: Four times a day (QID) | INTRAMUSCULAR | Status: DC | PRN

## 2024-07-26 MED ORDER — ENOXAPARIN SODIUM 40 MG/0.4ML IJ SOSY
40.0000 mg | PREFILLED_SYRINGE | INTRAMUSCULAR | Status: DC
  Administered 2024-07-26: 40 mg via SUBCUTANEOUS
  Filled 2024-07-26: qty 0.4

## 2024-07-26 MED ORDER — ACETAMINOPHEN 160 MG/5ML PO SOLN: 650.0000 mg | ORAL | Status: DC | PRN

## 2024-07-26 MED ORDER — ATORVASTATIN CALCIUM 10 MG PO TABS
10.0000 mg | ORAL_TABLET | Freq: Every day | ORAL | Status: DC
  Administered 2024-07-27: 10 mg via ORAL
  Filled 2024-07-26: qty 1

## 2024-07-26 MED ORDER — STROKE: EARLY STAGES OF RECOVERY BOOK
Freq: Once | Status: AC
  Filled 2024-07-26: qty 1

## 2024-07-26 MED ORDER — HYDRALAZINE HCL 20 MG/ML IJ SOLN: 10.0000 mg | Freq: Four times a day (QID) | INTRAMUSCULAR | Status: DC | PRN

## 2024-07-26 MED ORDER — ACETAMINOPHEN 325 MG PO TABS: 650.0000 mg | ORAL_TABLET | ORAL | Status: DC | PRN

## 2024-07-26 MED ORDER — IOHEXOL 350 MG/ML SOLN
75.0000 mL | Freq: Once | INTRAVENOUS | Status: AC | PRN
  Administered 2024-07-26: 75 mL via INTRAVENOUS

## 2024-07-26 MED ORDER — LEVOTHYROXINE SODIUM 88 MCG PO TABS
88.0000 ug | ORAL_TABLET | Freq: Every day | ORAL | Status: DC
  Administered 2024-07-27: 88 ug via ORAL
  Filled 2024-07-26: qty 1

## 2024-07-26 MED ORDER — TAMSULOSIN HCL 0.4 MG PO CAPS
0.4000 mg | ORAL_CAPSULE | Freq: Every day | ORAL | Status: DC
  Administered 2024-07-26: 0.4 mg via ORAL
  Filled 2024-07-26: qty 1

## 2024-07-26 MED ORDER — ACETAMINOPHEN 650 MG RE SUPP: 650.0000 mg | RECTAL | Status: DC | PRN

## 2024-07-26 MED ORDER — ASPIRIN 81 MG PO TBEC
81.0000 mg | DELAYED_RELEASE_TABLET | Freq: Every day | ORAL | Status: DC
  Administered 2024-07-26 – 2024-07-27 (×2): 81 mg via ORAL
  Filled 2024-07-26 (×2): qty 1

## 2024-07-26 MED ORDER — SENNOSIDES-DOCUSATE SODIUM 8.6-50 MG PO TABS: 1.0000 | ORAL_TABLET | Freq: Every evening | ORAL | Status: DC | PRN

## 2024-07-26 MED ORDER — INSULIN ASPART 100 UNIT/ML IJ SOLN: 0.0000 [IU] | Freq: Three times a day (TID) | INTRAMUSCULAR | Status: DC

## 2024-07-26 NOTE — Telephone Encounter (Signed)
 Mr. Bazen who is a patient of mine accompanied his wife to her appointment at 1 pm today. As he was wheeling his wife down the hallway in her rolling walker, he was bumping her into the wall. Once in the room, he sat in his seat and seemed to be confused. When asked what was wrong he stated  I just feel really tired today. He appears to stare off into space. His wife reported that he seemed to be confused today but was normal yesterday. During this visit he held a receipt that he received from the front desk and could not recall what the piece of paper was, he was reminded multiple times but could not grasp what the piece of paper was. He seemed to fall asleep multiple times while in the office. His neurological workup was otherwise normal. Due to symptoms EMS was notified and he was taken to [facility] for workup

## 2024-07-26 NOTE — H&P (Signed)
 History and Physical    DEMETRIC DUNNAWAY FMW:[id number] DOB: Aug 24, 1941 DOA: 07/26/2024  PCP: Merna Huxley, NP  Patient coming from: Home  I have personally briefly reviewed patient's old medical records in [facility]  Chief Complaint: Confusion, speech difficulty  HPI: Todd Watson is a 83 y.o. male with medical history significant for T2DM, HTN, HLD, hypothyroidism, sinus bradycardia, BPH who presented to the ED for evaluation of confusion and aphasia.  Patient was taken his spouse to her PCP appointment earlier today.  As he was wheeling his wife down the hallway in her rolling walker he was bumping her into the wall.  When they reached the room he sat down and seemed to be confused.  He stated that he just felt really tired.  Per PCP documentation he appeared to be staring off into space.  His spouse noted that he appeared to be the confused today but was normal yesterday.  He was holding a receipt from the front desk but could not recall what the piece of paper was despite multiple reminders.  Given his symptoms he EMS were called and he was taken to the ED for further evaluation.  Per EDP his spouse reported that his symptoms resolved after about 30 minutes.  At time of admitting evaluation patient is awake, alert, fully oriented and speaking appropriately.  He feels back to his usual baseline.  He states that he is very active at baseline and continues to do work around the house and in his yard.  He denies any chest pain, palpitations, dyspnea, nausea, vomiting.  ED Course  Labs/Imaging on admission: I have personally reviewed following labs and imaging studies.  Initial vitals showed BP 187/82, pulse 44, RR 16, temp 97.6 F, SpO2 96% on room air.  Labs showed WBC 5.4, hemoglobin 12.8, platelets 161, sodium 140, potassium 3.9, bicarb 23, BUN 10, creatinine 0.86, serum glucose 111, LFTs within normal limits.  UA negative for UTI.  CT head without contrast negative for acute  intracranial process.  EDP discussed with neurology (Dr. Michaela) recommended medical admission for CVA workup.  The hospitalist service was consulted for admission.  Review of Systems: All systems reviewed and are negative except as documented in history of present illness above.   Past Medical History:  Diagnosis Date   Arthritis    GERD (gastroesophageal reflux disease)    Herpes zoster    Hiatal hernia    Hypothyroidism    Osteoarthritis of lumbar spine     Past Surgical History:  Procedure Laterality Date   APPENDECTOMY  2000   INGUINAL HERNIA REPAIR Bilateral    LUMBAR LAMINECTOMY/DECOMPRESSION MICRODISCECTOMY Right 01/28/2020   Procedure: Microdiscectomy - right - Lumbar four-Lumbar five;  Surgeon: Onetha Kuba, MD;  [facility];  Service: Neurosurgery;  Laterality: Right;   SPINE SURGERY     spurs    Social History: Social History   Tobacco Use   Smoking status: Never   Smokeless tobacco: Never  Vaping Use   Vaping status: Never Used  Substance Use Topics   Alcohol use: No   Drug use: No   No Known Allergies  Family History  Problem Relation Age of Onset   Heart disease Father        Valve disease   Kidney failure Mother      Prior to Admission medications   Medication Sig Start Date End Date Taking? Authorizing Provider  atorvastatin  (LIPITOR) 10 MG tablet Take 10 mg by mouth at bedtime.  [provider]  blood glucose meter kit and supplies KIT Dispense based on patient and insurance preference. Use up to four times daily as directed. 09/02/22   Nafziger, Cory, NP  Boswellia-Glucosamine-Vit D (OSTEO BI-FLEX ONE PER DAY PO) Take 1 tablet by mouth daily.    [provider]  cephALEXin  (KEFLEX ) 500 MG capsule Take 1 capsule (500 mg total) by mouth 4 (four) times daily. Patient not taking: Reported on 06/22/2024 02/02/24   Ruthell Lonni FALCON, PA-C  clotrimazole-betamethasone (LOTRISONE) cream SMARTSIG:1 Gram(s) Topical Daily PRN  08/06/23   [provider]  diclofenac  Sodium (VOLTAREN ) 1 % GEL Apply 2 g topically 4 (four) times daily as needed (pain). 04/18/24   [place], Betty G, MD  gabapentin  (NEURONTIN ) 100 MG capsule Take 1 capsule (100 mg total) by mouth at bedtime. 04/21/22   Vernetta Lonni GRADE, MD  levothyroxine  (SYNTHROID ) 88 MCG tablet Take 88 mcg by mouth daily before breakfast.    [provider]  lisinopril  (PRINIVIL ,ZESTRIL ) 10 MG tablet Take 5 mg by mouth in the morning and at bedtime.     [provider]  Magnesium 250 MG TABS Take by mouth daily.    [provider]  metFORMIN (GLUCOPHAGE) 500 MG tablet Take by mouth daily with breakfast.    [provider]  Multiple Vitamin (MULTIVITAMIN) tablet Take 1 tablet by mouth daily.    [provider]  Omega-3 1000 MG CAPS Take 1,000 mg by mouth daily.    [provider]  omeprazole (PRILOSEC) 20 MG capsule Take 20 mg by mouth in the morning and at bedtime.    [provider]  Probiotic Product (PROBIOTIC DAILY PO) Take 1 capsule by mouth daily.     [provider]  psyllium (METAMUCIL) 58.6 % powder Take 1 packet by mouth daily as needed (constipation).    [provider]  sildenafil  (VIAGRA ) 100 MG tablet Take by mouth. 12/09/22   [provider]  tamsulosin  (FLOMAX ) 0.4 MG CAPS capsule Take 0.4 mg by mouth at bedtime.    [provider]  vitamin C (ASCORBIC ACID ) 500 MG tablet Take 500 mg by mouth daily.    [provider]    Physical Exam: Vitals:   07/26/24 1622 07/26/24 1818 07/26/24 2000 07/26/24 2011  BP: (!) 185/81 (!) 188/109  (!) 182/93  Pulse: (!) 54 (!) 48 (!) 52 (!) 53  Resp: 16 18 17 10   Temp: 97.6 F (36.4 C) 97.6 F (36.4 C)    TempSrc: Oral Oral    SpO2: 100% 100% 97% 97%  Weight:      Height:       Constitutional: Standby, NAD, calm, comfortable Eyes: EOMI, lids and conjunctivae normal ENMT: Mucous membranes are moist.  Posterior pharynx clear of any exudate or lesions.Normal dentition.  Neck: normal, supple, no masses. Respiratory: clear to auscultation bilaterally, no wheezing, no crackles. Normal respiratory effort. No accessory muscle use.  Cardiovascular: Regular rate and rhythm, systolic murmur. No extremity edema. 2+ pedal pulses. Abdomen: no tenderness, no masses palpated. Musculoskeletal: no clubbing / cyanosis. No joint deformity upper and lower extremities. Good ROM, no contractures. Normal muscle tone.  Skin: no rashes, lesions, ulcers. No induration Neurologic: Sensation intact. Strength 5/5 in all 4.  Psychiatric: Normal judgment and insight. Alert and oriented x 3. Normal mood.   EKG: Personally reviewed. Sinus bradycardia, rate 48, no acute ischemic changes.  Previous EKG showed sinus bradycardia rate 58.  Assessment/Plan Principal Problem:   Stroke-like  symptoms Active Problems:   Type 2 diabetes mellitus (HCC)   Hyperlipidemia associated with type 2 diabetes mellitus (HCC)   Sinus bradycardia   Hypertension associated with diabetes (HCC)   Pearley Baranek Roussell is a 83 y.o. male with medical history significant for T2DM, HTN, HLD, hypothyroidism, sinus bradycardia, BPH who is admitted for evaluation of transient confusion/aphasia.  Assessment and Plan: Transient confusion/aphasia: Symptoms occurred at PCP office prior to arrival and reportedly resolved after about 30 minutes.  Initial CT head negative for acute changes.  Neurology recommending admission for TIA workup. - Obtain MRI brain - CTA head/neck - Aspirin  81 mg daily - Obtain echocardiogram - Keep on telemetry, continue neurochecks - PT/OT/ST eval - Allowing permissive hypertension for now - Check hemoglobin A1c and lipid panel - Neurology to consult  Chronic sinus bradycardia: Baseline HR appears to be in the 50s.  Pulse has dropped into the 40s on occasion while in the ED, he has been asymptomatic.  He does not appear to be  on beta-blocker.  Continue to monitor.  Hypertension: Holding home lisinopril  for now.  Type 2 diabetes: Hold metformin, placed on SSI.  Hyperlipidemia: Continue atorvastatin .  Hypothyroidism: Continue Synthroid .  BPH: Continue Flomax .   DVT prophylaxis: enoxaparin (LOVENOX) injection 40 mg Start: 07/26/24 2130 Code Status: Full code, confirmed with patient on admission Family Communication: Discussed with patient, he has discussed with family Disposition Plan: From home, dispo pending clinical progress Consults called: Neurology Severity of Illness: The appropriate patient status for this patient is OBSERVATION. Observation status is judged to be reasonable and necessary in order to provide the required intensity of service to ensure the patient's safety. The patient's presenting symptoms, physical exam findings, and initial radiographic and laboratory data in the context of their medical condition is felt to place them at decreased risk for further clinical deterioration. Furthermore, it is anticipated that the patient will be medically stable for discharge from the hospital within 2 midnights of admission.   Jorie Blanch MD [facility]  If 7PM-7AM, please contact night-coverage [email or web address]  07/26/2024, 9:37 PM

## 2024-07-26 NOTE — ED Notes (Signed)
MD made aware of current BP

## 2024-07-26 NOTE — Hospital Course (Signed)
 Todd Watson is a 83 y.o. male with medical history significant for T2DM, HTN, HLD, hypothyroidism, sinus bradycardia, BPH who presented to the ED for evaluation of confusion and aphasia.   Patient was taking his spouse to her PCP appointmen.  As he was wheeling his wife down the hallway in her rolling walker he was bumping her into the wall.  When they reached the room he sat down and seemed to be confused.  He stated that he just felt really tired.  Per PCP documentation he appeared to be staring off into space.  His spouse noted that he appeared to be the confused but was normal yesterday.  He was holding a receipt from the front desk but could not recall what the piece of paper was despite multiple reminders.  Given his symptoms he EMS were called and he was taken to the ED for further evaluation.  Per EDP his spouse reported that his symptoms resolved after about 30 minutes.   At time of admitting evaluation patient is awake, alert, fully oriented and speaking appropriately.  He feels back to his usual baseline.  He states that he is very active at baseline and continues to do work around the house and in his yard.  He denies any chest pain, palpitations, dyspnea, nausea, vomiting.  He was evaluated by neurology on admission as well symptoms were felt to be atypical for seizure or TIA.  He was admitted for further workup.  CT head is unremarkable.  CT angio head/neck also negative for LVO or significant stenosis. MRI brain also negative for stroke or acute findings.  EEG unremarkable. Echo read still pending at discharge and in efforts to not delay discharge, patient allowed to go and will follow up read.   He was started on baby aspirin  at discharge however.  Outpatient follow-up with neurology in 8 weeks recommended.  With further workup and collateral information, his TSH returned at 24.233.  Free T4 low, 0.31. With further questioning, it appears he has not been taking Synthroid   consistently at home for unclear reasons but possibly due to some cognitive impairment from his wife who handles their medications.  This was discussed with family bedside in the hospital and they plan to work on obtaining pill dispenser's and frequent observation of their medication administrations.  Patient strongly encouraged to continue remaining compliant on Synthroid  at discharge with outpatient repeat of levels in 4 to 6 weeks and avoiding driving until levels normalized and no further transient symptoms and/or when cleared by neurology or primary care.

## 2024-07-26 NOTE — ED Notes (Signed)
 Pt to CT

## 2024-07-26 NOTE — ED Provider Notes (Addendum)
 [place]GENCY DEPARTMENT AT [facility] Provider Note   CSN: [id number] Arrival date & time: 07/26/24  1443     Patient presents with: No chief complaint on file.   Todd Watson is a 83 y.o. male.   HPI    83 year old male comes into the emergency room with chief complaint of confusion. Collateral history provided by patient's wife and also EMS.  According to EMS, patient was found to be confused by the doctors at the clinic, and they called 911.  Subsequently I called patient's wife.  She states that patient was silent, which is not his usual self at the doctor's visit.  Subsequently, when they received a bill for the doctor's visit, patient was confused and he was speaking nonsensically.  This got the doctor at the clinic concerned and they called EMS.  The episode lasted 30 minutes.  According to EMS, somebody at the clinic also noted that patient while pushing his wife, was running into a wall.  According to the patient, he remembers taking his wife to the doctor's appointment.  He does not recall having any balance issues.  He does not remember the episode of confusion.  At the moment, he has no one-sided weakness, numbness, slurred speech, vision changes.  Of note, patient's heart rate noted to be in the 40s.  He indicates that about a month ago he was on a heart medication that was doubled.  He has not experienced any dizziness or lightheadedness recently.  Prior to Admission medications   Medication Sig Start Date End Date Taking? Authorizing Provider  atorvastatin  (LIPITOR) 10 MG tablet Take 10 mg by mouth at bedtime.    [provider]  blood glucose meter kit and supplies KIT Dispense based on patient and insurance preference. Use up to four times daily as directed. 09/02/22   Nafziger, Cory, NP  Boswellia-Glucosamine-Vit D (OSTEO BI-FLEX ONE PER DAY PO) Take 1 tablet by mouth daily.    [provider]  cephALEXin  (KEFLEX ) 500 MG  capsule Take 1 capsule (500 mg total) by mouth 4 (four) times daily. Patient not taking: Reported on 06/22/2024 02/02/24   Ruthell Lonni FALCON, PA-C  clotrimazole-betamethasone (LOTRISONE) cream SMARTSIG:1 Gram(s) Topical Daily PRN 08/06/23   [provider]  diclofenac  Sodium (VOLTAREN ) 1 % GEL Apply 2 g topically 4 (four) times daily as needed (pain). 04/18/24   [place], Betty G, MD  gabapentin  (NEURONTIN ) 100 MG capsule Take 1 capsule (100 mg total) by mouth at bedtime. 04/21/22   Vernetta Lonni GRADE, MD  levothyroxine  (SYNTHROID ) 88 MCG tablet Take 88 mcg by mouth daily before breakfast.    [provider]  lisinopril  (PRINIVIL ,ZESTRIL ) 10 MG tablet Take 5 mg by mouth in the morning and at bedtime.     [provider]  Magnesium 250 MG TABS Take by mouth daily.    [provider]  metFORMIN (GLUCOPHAGE) 500 MG tablet Take by mouth daily with breakfast.    [provider]  Multiple Vitamin (MULTIVITAMIN) tablet Take 1 tablet by mouth daily.    [provider]  Omega-3 1000 MG CAPS Take 1,000 mg by mouth daily.    [provider]  omeprazole (PRILOSEC) 20 MG capsule Take 20 mg by mouth in the morning and at bedtime.    [provider]  Probiotic Product (PROBIOTIC DAILY PO) Take 1 capsule by mouth daily.     [provider]  psyllium (METAMUCIL) 58.6 % powder Take 1 packet by  mouth daily as needed (constipation).    [provider]  sildenafil  (VIAGRA ) 100 MG tablet Take by mouth. 12/09/22   [provider]  tamsulosin  (FLOMAX ) 0.4 MG CAPS capsule Take 0.4 mg by mouth at bedtime.    [provider]  vitamin C (ASCORBIC ACID ) 500 MG tablet Take 500 mg by mouth daily.    [provider]    Allergies: Patient has no known allergies.    Review of Systems  All other systems reviewed and are negative.   Updated Vital Signs BP (!) 182/93   Pulse (!) 53   Temp 97.6 F (36.4 C)  (Oral)   Resp 10   Ht 5' 7 (1.702 m)   Wt 70.3 kg   SpO2 97%   BMI 24.28 kg/m   Physical Exam Vitals and nursing note reviewed.  Constitutional:      Appearance: He is well-developed.  HENT:     Head: Atraumatic.  Eyes:     Extraocular Movements: Extraocular movements intact.     Pupils: Pupils are equal, round, and reactive to light.     Comments: Peripheral visual fields are intact  Cardiovascular:     Rate and Rhythm: Bradycardia present.  Pulmonary:     Effort: Pulmonary effort is normal.  Musculoskeletal:     Cervical back: Neck supple.  Skin:    General: Skin is warm.  Neurological:     Mental Status: He is alert and oriented to person, place, and time.     Cranial Nerves: No cranial nerve deficit.     Sensory: No sensory deficit.     Motor: No weakness.     Coordination: Coordination normal.     (all labs ordered are listed, but only abnormal results are displayed) Labs Reviewed  COMPREHENSIVE METABOLIC PANEL WITH GFR - Abnormal; Notable for the following components:      Result Value   Glucose, Bld 111 (*)    Calcium  8.4 (*)    Total Protein 6.4 (*)    All other components within normal limits  CBC - Abnormal; Notable for the following components:   RBC 4.05 (*)    Hemoglobin 12.8 (*)    HCT 38.5 (*)    All other components within normal limits  URINALYSIS, ROUTINE W REFLEX MICROSCOPIC - Abnormal; Notable for the following components:   Leukocytes,Ua SMALL (*)    All other components within normal limits  CBG MONITORING, ED - Abnormal; Notable for the following components:   Glucose-Capillary 107 (*)    All other components within normal limits    EKG: EKG Interpretation Date/Time:  Thursday July 26 2024 14:51:43 EDT Ventricular Rate:  48 PR Interval:  158 QRS Duration:  92 QT Interval:  446 QTC Calculation: 398 R Axis:   13  Text Interpretation: Sinus bradycardia Otherwise normal ECG When compared with ECG of 08-Nov-2023 09:41, PREVIOUS  ECG IS PRESENT Confirmed by Charlyn Sora [phone]) on 07/26/2024 8:45:55 PM  Radiology: CT Head Wo Contrast Result Date: 07/26/2024 CLINICAL DATA:  Altered level of consciousness, confusion, loss of balance EXAM: CT HEAD WITHOUT CONTRAST TECHNIQUE: Contiguous axial images were obtained from the base of the skull through the vertex without intravenous contrast. RADIATION DOSE REDUCTION: This exam was performed according to the departmental dose-optimization program which includes automated exposure control, adjustment of the mA and/or kV according to patient size and/or use of iterative reconstruction technique. COMPARISON:  None Available. FINDINGS: Brain: No acute infarct or hemorrhage. Lateral ventricles and midline  structures are unremarkable. No acute extra-axial fluid collections. No mass effect. Vascular: No hyperdense vessel or unexpected calcification. Skull: Normal. Negative for fracture or focal lesion. Sinuses/Orbits: No acute finding. Other: None. IMPRESSION: 1. No acute intracranial process. Electronically Signed   By: Ozell Daring M.D.   On: 07/26/2024 16:02     Procedures   Medications Ordered in the ED - No data to display        ABCD2 Score: 5                         Medical Decision Making Amount and/or Complexity of Data Reviewed Labs: ordered. Radiology: ordered.  Risk Decision regarding hospitalization.   83 year old patient comes in with chief complaint of confusion.  Currently he is at baseline normal. He has history of hypertension, diabetes, hyperlipidemia, but no history of CAD or stroke.  Differential diagnosis considered for this patient includes: Stroke - ischemic vs. hemorrhagic TIA Electrolyte abnormality Medication side effect Muscular disease  Plan is to get basic labs, CT scan of the brain.  He has nonfocal neuroexam at this time.  ABCD2 score is 5.  I consulted neurology and they recommend admission for TIA.  Dr. Michaela will see the  patient.  Patient also found to be bradycardic.  He is not symptomatic with it.   Final diagnoses:  TIA (transient ischemic attack)  Slurred speech    ED Discharge Orders     None           Charlyn Sora, MD 07/26/24 2048

## 2024-07-26 NOTE — ED Triage Notes (Addendum)
 Pt BIB  [facility] from PCP with his wife, was wheeling his wife in the wheelchair and seemed confused/off balance and running wheelchair into the wall. A/O x4, stroke screen negative per EMS. Feels more tired than normal but other than that reports he feels at his baseline.  BP 160 palp HR 60-62 CBG 144

## 2024-07-26 NOTE — ED Notes (Signed)
 Attempted to call report to the nurse with no answer.

## 2024-07-27 ENCOUNTER — Observation Stay (HOSPITAL_COMMUNITY)

## 2024-07-27 DIAGNOSIS — E039 Hypothyroidism, unspecified: Secondary | ICD-10-CM | POA: Diagnosis not present

## 2024-07-27 DIAGNOSIS — R410 Disorientation, unspecified: Principal | ICD-10-CM

## 2024-07-27 DIAGNOSIS — E0590 Thyrotoxicosis, unspecified without thyrotoxic crisis or storm: Secondary | ICD-10-CM

## 2024-07-27 DIAGNOSIS — R569 Unspecified convulsions: Secondary | ICD-10-CM | POA: Diagnosis not present

## 2024-07-27 DIAGNOSIS — I10 Essential (primary) hypertension: Secondary | ICD-10-CM

## 2024-07-27 DIAGNOSIS — G459 Transient cerebral ischemic attack, unspecified: Secondary | ICD-10-CM

## 2024-07-27 LAB — SEDIMENTATION RATE: Sed Rate: 1 mm/h (ref 0–16)

## 2024-07-27 LAB — HEMOGLOBIN A1C
Hgb A1c MFr Bld: 6 % — ABNORMAL HIGH (ref 4.8–5.6)
Mean Plasma Glucose: 125.5 mg/dL

## 2024-07-27 LAB — CBC
HCT: 36.4 % — ABNORMAL LOW (ref 39.0–52.0)
Hemoglobin: 12.4 g/dL — ABNORMAL LOW (ref 13.0–17.0)
MCH: 31.6 pg (ref 26.0–34.0)
MCHC: 34.1 g/dL (ref 30.0–36.0)
MCV: 92.9 fL (ref 80.0–100.0)
Platelets: 142 K/uL — ABNORMAL LOW (ref 150–400)
RBC: 3.92 MIL/uL — ABNORMAL LOW (ref 4.22–5.81)
RDW: 13.7 % (ref 11.5–15.5)
WBC: 5.3 K/uL (ref 4.0–10.5)
nRBC: 0 % (ref 0.0–0.2)

## 2024-07-27 LAB — GLUCOSE, CAPILLARY
Glucose-Capillary: 91 mg/dL (ref 70–99)
Glucose-Capillary: 96 mg/dL (ref 70–99)
Glucose-Capillary: 92 mg/dL (ref 70–99)

## 2024-07-27 LAB — LIPID PANEL
Cholesterol: 141 mg/dL (ref 0–200)
HDL: 37 mg/dL — ABNORMAL LOW (ref >40)
LDL Cholesterol: 84 mg/dL (ref 0–99)
Total CHOL/HDL Ratio: 3.8 ratio
Triglycerides: 102 mg/dL (ref <150)
VLDL: 20 mg/dL (ref 0–40)

## 2024-07-27 LAB — BASIC METABOLIC PANEL WITH GFR
Anion gap: 9 (ref 5–15)
BUN: 8 mg/dL (ref 8–23)
CO2: 24 mmol/L (ref 22–32)
Calcium: 8.1 mg/dL — ABNORMAL LOW (ref 8.9–10.3)
Chloride: 105 mmol/L (ref 98–111)
Creatinine, Ser: 0.89 mg/dL (ref 0.61–1.24)
GFR, Estimated: >60 mL/min (ref >60)
Glucose, Bld: 101 mg/dL — ABNORMAL HIGH (ref 70–99)
Potassium: 3.7 mmol/L (ref 3.5–5.1)
Sodium: 138 mmol/L (ref 135–145)

## 2024-07-27 LAB — ECHOCARDIOGRAM COMPLETE
AR max vel: 1.55 cm2
AV Area VTI: 1.51 cm2
AV Area mean vel: 1.41 cm2
AV Mean grad: 10 mmHg
AV Peak grad: 17.8 mmHg
Ao pk vel: 2.11 m/s
Area-P 1/2: 2.18 cm2
Height: 67 in
S' Lateral: 3.6 cm
Weight: 2480 [oz_av]

## 2024-07-27 LAB — T4, FREE: Free T4: 0.31 ng/dL — ABNORMAL LOW (ref 0.61–1.12)

## 2024-07-27 LAB — FOLATE: Folate: 12.9 ng/mL (ref >5.9)

## 2024-07-27 LAB — AMMONIA: Ammonia: 41 umol/L — ABNORMAL HIGH (ref 9–35)

## 2024-07-27 LAB — RPR: RPR Ser Ql: NONREACTIVE

## 2024-07-27 LAB — TSH: TSH: 24.233 u[IU]/mL — ABNORMAL HIGH (ref 0.350–4.500)

## 2024-07-27 LAB — HIV ANTIBODY (ROUTINE TESTING W REFLEX): HIV Screen 4th Generation wRfx: NONREACTIVE

## 2024-07-27 LAB — VITAMIN B12: Vitamin B-12: 305 pg/mL (ref 180–914)

## 2024-07-27 MED ORDER — ASPIRIN 81 MG PO TBEC: 81.0000 mg | DELAYED_RELEASE_TABLET | Freq: Every day | ORAL | Status: AC

## 2024-07-27 NOTE — Discharge Instructions (Signed)
 No driving until cleared by neurology or primary care.  Take your thyroid  medication consistently as prescribed.

## 2024-07-27 NOTE — Progress Notes (Signed)
 SLP Cancellation Note  Patient Details Name: Todd Watson MRN: [id number] DOB: [birth year]   Cancelled treatment:       Reason Eval/Treat Not Completed: Patient at procedure or test/unavailable (Pt being set up for EEG. Will continue efforts as appropriate.)  Todd Watson, M.S., CCC-SLP Speech-Language Pathologist Secure Chat Preferred  O: [phone]   Todd Watson 07/27/2024, 8:26 AM

## 2024-07-27 NOTE — Evaluation (Signed)
 Speech Language Pathology Evaluation Patient Details Name: Todd Watson MRN: [id number] DOB: [birth year] Today's Date: 07/27/2024 Time: 8866-8844 SLP Time Calculation (min) (ACUTE ONLY): 22 min  Problem List:  Patient Active Problem List   Diagnosis Date Noted   Stroke-like symptoms 07/26/2024   Sinus bradycardia 07/26/2024   Hypertension associated with diabetes (HCC) 07/26/2024   History of colonic polyps 11/15/2023   Hyperlipidemia associated with type 2 diabetes mellitus (HCC) 11/06/2020   Essential hypertension, benign 07/04/2014   Hypothyroidism 07/04/2014   GERD (gastroesophageal reflux disease) 07/04/2014   Lumbar spondylosis 07/04/2014   Type 2 diabetes mellitus (HCC) 07/04/2014   Cholelithiases 07/04/2014   Herpes zoster 07/04/2014   History of renal calculi 07/04/2014   Diverticulitis of colon 07/04/2014   Hiatal hernia 07/04/2014   Past Medical History:  Past Medical History:  Diagnosis Date   Arthritis    GERD (gastroesophageal reflux disease)    Herpes zoster    Hiatal hernia    Hypothyroidism    Osteoarthritis of lumbar spine    Past Surgical History:  Past Surgical History:  Procedure Laterality Date   APPENDECTOMY  2000   INGUINAL HERNIA REPAIR Bilateral    LUMBAR LAMINECTOMY/DECOMPRESSION MICRODISCECTOMY Right 01/28/2020   Procedure: Microdiscectomy - right - Lumbar four-Lumbar five;  Surgeon: Onetha Kuba, MD;  Location: [facility];  Service: Neurosurgery;  Laterality: Right;   SPINE SURGERY     spurs   HPI:  Todd Watson is a 83 y.o. male who presented to ED with confusion/aphasia. Pt with medical history significant for T2DM, HTN, HLD, hypothyroidism, sinus bradycardia, BPH. MRI negative for stroke.   Assessment / Plan / Recommendation Clinical Impression  Pt seen for cognitive-communication evaluation. Assessment completed via informal means. Pt with notable deficits affecting attention (sustained, divided), problem solving, safety awareness, and  short term memory. Pt with x3 attempts to stand up/ambulate despite being told to stay seated for evaluation. Speech is fluent, tangential, and with reduced turntaking appreciated. Pt difficult to redirect at times. No family present to provide additional details of baseline level of functioning. Recommend ongoing ST services in acute for further asessment and to help if pt's CLOF is different than his baseline.    SLP Assessment  SLP Recommendation/Assessment: Patient needs continued Speech Language Pathology Services SLP Visit Diagnosis: Cognitive communication deficit (R41.841)     Assistance Recommended at Discharge  Frequent or constant Supervision/Assistance  Functional Status Assessment Patient has had a recent decline in their functional status and demonstrates the ability to make significant improvements in function in a reasonable and predictable amount of time.  Frequency and Duration min 2x/week  2 weeks      SLP Evaluation Cognition  Overall Cognitive Status: Impaired/Different from baseline Arousal/Alertness: Awake/alert Orientation Level: Oriented X4 Attention: Sustained;Divided Sustained Attention: Impaired Sustained Attention Impairment: Verbal basic;Functional basic Divided Attention: Impaired Divided Attention Impairment: Functional basic Memory: Impaired Memory Impairment: Decreased short term memory Awareness: Impaired Awareness Impairment: Intellectual impairment Problem Solving: Impaired Problem Solving Impairment: Functional basic Executive Function: Reasoning Reasoning: Impaired Reasoning Impairment: Verbal basic Behaviors: Impulsive;Perseveration Safety/Judgment: Impaired       Comprehension  Auditory Comprehension Overall Auditory Comprehension: Appears within functional limits for tasks assessed (extra time, repetition) Interfering Components: Hearing EffectiveTechniques: Extra processing time;Increased volume;Repetition Visual  Recognition/Discrimination Discrimination: Not tested Reading Comprehension Reading Status: Not tested    Expression Expression Primary Mode of Expression: Verbal Verbal Expression Overall Verbal Expression: Impaired Initiation: No impairment Automatic Speech: Name;Social Response (WFL) Naming: No impairment Pragmatics: Impairment  Impairments: Topic appropriateness;Turn Taking Interfering Components: Attention Written Expression Dominant Hand: Right   Oral / Motor  Oral Motor/Sensory Function Overall Oral Motor/Sensory Function: Within functional limits Motor Speech Overall Motor Speech: Appears within functional limits for tasks assessed (minimal articulatory imprecision with lower denture doffed)           Delon Bangs, M.S., CCC-SLP Speech-Language Pathologist Secure Chat Preferred  O: [phone]  Delon CHRISTELLA Bangs 07/27/2024, 12:21 PM

## 2024-07-27 NOTE — Discharge Summary (Signed)
 Physician Discharge Summary   Todd Watson FMW:[id number] DOB: Jul 27, 1941 DOA: 07/26/2024  PCP: Merna Huxley, NP  Admit date: 07/26/2024 Discharge date: 07/27/2024  Admitted From: Home Disposition:  Home Discharging physician: Alm Apo, MD Barriers to discharge: none  Recommendations at discharge: Ensure compliance and proper administration of synthroid  Repeat TSH and FT4 in 4-6 weeks Clear patient for driving when appropriate Follow up with neurology in 8 weeks   Discharge Condition: stable CODE STATUS: Full  Diet recommendation:  Diet Orders (From admission, onward)     Start     Ordered   07/27/24 0828  Diet regular Fluid consistency: Thin  Diet effective now       Question:  Fluid consistency:  Answer:  Thin   07/27/24 0827   07/27/24 0000  Diet general        07/27/24 1736            Hospital Course: Todd Watson is a 83 y.o. male with medical history significant for T2DM, HTN, HLD, hypothyroidism, sinus bradycardia, BPH who presented to the ED for evaluation of confusion and aphasia.   Patient was taking his spouse to her PCP appointmen.  As he was wheeling his wife down the hallway in her rolling walker he was bumping her into the wall.  When they reached the room he sat down and seemed to be confused.  He stated that he just felt really tired.  Per PCP documentation he appeared to be staring off into space.  His spouse noted that he appeared to be the confused but was normal yesterday.  He was holding a receipt from the front desk but could not recall what the piece of paper was despite multiple reminders.  Given his symptoms he EMS were called and he was taken to the ED for further evaluation.  Per EDP his spouse reported that his symptoms resolved after about 30 minutes.   At time of admitting evaluation patient is awake, alert, fully oriented and speaking appropriately.  He feels back to his usual baseline.  He states that he is very active at baseline  and continues to do work around the house and in his yard.  He denies any chest pain, palpitations, dyspnea, nausea, vomiting.  He was evaluated by neurology on admission as well symptoms were felt to be atypical for seizure or TIA.  He was admitted for further workup.  CT head is unremarkable.  CT angio head/neck also negative for LVO or significant stenosis. MRI brain also negative for stroke or acute findings.  EEG unremarkable. Echo read still pending at discharge and in efforts to not delay discharge, patient allowed to go and will follow up read.   He was started on baby aspirin  at discharge however.  Outpatient follow-up with neurology in 8 weeks recommended.  With further workup and collateral information, his TSH returned at 24.233.  Free T4 low, 0.31. With further questioning, it appears he has not been taking Synthroid  consistently at home for unclear reasons but possibly due to some cognitive impairment from his wife who handles their medications.  This was discussed with family bedside in the hospital and they plan to work on obtaining pill dispenser's and frequent observation of their medication administrations.  Patient strongly encouraged to continue remaining compliant on Synthroid  at discharge with outpatient repeat of levels in 4 to 6 weeks and avoiding driving until levels normalized and no further transient symptoms and/or when cleared by neurology or primary care.  The patient's acute and chronic medical conditions were treated accordingly. On day of discharge, patient was felt deemed stable for discharge. Patient/family member advised to call PCP or come back to ER if needed.   Principal Diagnosis: Confusion  Discharge Diagnoses: Active Hospital Problems   Diagnosis Date Noted   Confusion 07/26/2024    Priority: 1.   Hypothyroidism 07/04/2014    Priority: 2.   Sinus bradycardia 07/26/2024   Hypertension associated with diabetes (HCC) 07/26/2024    Hyperlipidemia associated with type 2 diabetes mellitus (HCC) 11/06/2020   Type 2 diabetes mellitus (HCC) 07/04/2014    Resolved Hospital Problems  No resolved problems to display.     Discharge Instructions     Diet general   Complete by: As directed    Increase activity slowly   Complete by: As directed       Allergies as of 07/27/2024   No Known Allergies      Medication List     STOP taking these medications    diclofenac  Sodium 1 % Gel Commonly known as: VOLTAREN        TAKE these medications    ascorbic acid  500 MG tablet Commonly known as: VITAMIN C Take 500 mg by mouth daily.   aspirin  EC 81 MG tablet Take 1 tablet (81 mg total) by mouth daily. Swallow whole. Start taking on: July 28, 2024   atorvastatin  10 MG tablet Commonly known as: LIPITOR Take 10 mg by mouth daily.   blood glucose meter kit and supplies Kit Dispense based on patient and insurance preference. Use up to four times daily as directed.   levothyroxine  88 MCG tablet Commonly known as: SYNTHROID  Take 88 mcg by mouth daily before breakfast.   lisinopril  10 MG tablet Commonly known as: ZESTRIL  Take 10 mg by mouth in the morning and at bedtime.   metFORMIN 500 MG tablet Commonly known as: GLUCOPHAGE Take by mouth daily with breakfast.   multivitamin tablet Take 1 tablet by mouth daily.   omeprazole 20 MG capsule Commonly known as: PRILOSEC Take 20 mg by mouth daily. May take 1 additional capsule in the evening as needed for heartburn   OSTEO BI-FLEX ONE PER DAY PO Take 1 tablet by mouth daily.   PROBIOTIC DAILY PO Take 1 capsule by mouth daily.   tamsulosin  0.4 MG Caps capsule Commonly known as: FLOMAX  Take 0.4 mg by mouth at bedtime.        Follow-up Information     [facility]. Schedule an appointment as soon as possible for a visit in 2 month(s).   Contact information: [address]     [address] [place]   [address] [phone]        Merna Huxley, NP. Schedule an appointment as soon as possible for a visit in 1 week(s).   Specialty: Family Medicine Contact information: [address] [place] [address] [phone]                No Known Allergies  Consultations: Neurology   Procedures:   Discharge Exam: BP (!) 145/65 (BP Location: Right Arm)   Pulse (!) 49   Temp 98.4 F (36.9 C) (Oral)   Resp 19   Ht 5' 7 (1.702 m)   Wt 70.3 kg   SpO2 97%   BMI 24.28 kg/m  Physical Exam Constitutional:      General: He is not in acute distress.    Appearance: Normal appearance.  HENT:  Head: Normocephalic and atraumatic.     Mouth/Throat:     Mouth: Mucous membranes are moist.  Eyes:     Extraocular Movements: Extraocular movements intact.  Cardiovascular:     Rate and Rhythm: Normal rate and regular rhythm.  Pulmonary:     Effort: Pulmonary effort is normal. No respiratory distress.     Breath sounds: Normal breath sounds. No wheezing.  Abdominal:     General: Bowel sounds are normal. There is no distension.     Palpations: Abdomen is soft.     Tenderness: There is no abdominal tenderness.  Musculoskeletal:        General: Normal range of motion.     Cervical back: Normal range of motion and neck supple.  Skin:    General: Skin is warm and dry.  Neurological:     General: No focal deficit present.     Mental Status: He is alert.  Psychiatric:        Mood and Affect: Mood normal.        Behavior: Behavior normal.      The results of significant diagnostics from this hospitalization (including imaging, microbiology, ancillary and laboratory) are listed below for reference.   Microbiology: No results found for this or any previous visit (from the past 240 hours).   Labs: BNP (last 3 results) No results for input(s): BNP in the last 8760 hours. Basic Metabolic Panel: Recent Labs  Lab 07/26/24 1452 07/27/24 0555  NA 140 138  K  3.9 3.7  CL 105 105  CO2 23 24  GLUCOSE 111* 101*  BUN 10 8  CREATININE 0.86 0.89  CALCIUM  8.4* 8.1*   Liver Function Tests: Recent Labs  Lab 07/26/24 1452  AST 30  ALT 22  ALKPHOS 49  BILITOT 0.5  PROT 6.4*  ALBUMIN 3.9   No results for input(s): LIPASE, AMYLASE in the last 168 hours. Recent Labs  Lab 07/27/24 0555  AMMONIA 41*   CBC: Recent Labs  Lab 07/26/24 1452 07/27/24 0555  WBC 5.4 5.3  HGB 12.8* 12.4*  HCT 38.5* 36.4*  MCV 95.1 92.9  PLT 161 142*   Cardiac Enzymes: No results for input(s): CKTOTAL, CKMB, CKMBINDEX, TROPONINI in the last 168 hours. BNP: Invalid input(s): POCBNP CBG: Recent Labs  Lab 07/26/24 1458 07/27/24 0948 07/27/24 1223 07/27/24 1648  GLUCAP 107* 91 96 92   D-Dimer No results for input(s): DDIMER in the last 72 hours. Hgb A1c Recent Labs    07/27/24 0555  HGBA1C 6.0*   Lipid Profile Recent Labs    07/27/24 0555  CHOL 141  HDL 37*  LDLCALC 84  TRIG 897  CHOLHDL 3.8   Thyroid  function studies Recent Labs    07/27/24 0555  TSH 24.233*   Anemia work up Recent Labs    07/27/24 0910  VITAMINB12 305  FOLATE 12.9   Urinalysis    Component Value Date/Time   COLORURINE YELLOW 07/26/2024 1915   APPEARANCEUR CLEAR 07/26/2024 1915   LABSPEC 1.008 07/26/2024 1915   PHURINE 7.0 07/26/2024 1915   GLUCOSEU NEGATIVE 07/26/2024 1915   HGBUR NEGATIVE 07/26/2024 1915   BILIRUBINUR NEGATIVE 07/26/2024 1915   BILIRUBINUR n 07/31/2015 1043   KETONESUR NEGATIVE 07/26/2024 1915   PROTEINUR NEGATIVE 07/26/2024 1915   UROBILINOGEN 1.0 07/31/2015 1043   NITRITE NEGATIVE 07/26/2024 1915   LEUKOCYTESUR SMALL (A) 07/26/2024 1915   Sepsis Labs Recent Labs  Lab 07/26/24 1452 07/27/24 0555  WBC 5.4 5.3   Microbiology No results  found for this or any previous visit (from the past 240 hours).  Procedures/Studies: EEG adult Result Date: 07/27/2024 Shelton Arlin KIDD, MD     07/27/2024 10:08 AM Patient  Name: Todd Watson MRN: [id number] Epilepsy Attending: Arlin KIDD Shelton Referring Physician/Provider: Michaela Aisha SQUIBB, MD Date: 07/27/2024 Duration: 22.19 mins Patient history: 83 y.o. male with a transient confusional episode. EEG to evaluate for seizure Level of alertness: Awake AEDs during EEG study: None Technical aspects: This EEG study was done with scalp electrodes positioned according to the 10-20 International system of electrode placement. Electrical activity was reviewed with band pass filter of 1-70Hz , sensitivity of 7 uV/mm, display speed of 55mm/sec with a 60Hz  notched filter applied as appropriate. EEG data were recorded continuously and digitally stored.  Video monitoring was available and reviewed as appropriate. Description: The posterior dominant rhythm consists of 8-9 Hz activity of moderate voltage (25-35 uV) seen predominantly in posterior head regions, symmetric and reactive to eye opening and eye closing. Drowsiness was characterized by attenuation of the posterior background rhythm. Hyperventilation and photic stimulation were not performed.   IMPRESSION: This study is within normal limits. No seizures or epileptiform discharges were seen throughout the recording. However, only wakefulness and drowsiness were recorded. A normal interictal EEG does not exclude the diagnosis of epilepsy. Arlin KIDD Shelton   MR BRAIN WO CONTRAST Result Date: 07/26/2024 EXAM: MRI BRAIN WITHOUT CONTRAST 07/26/2024 10:47:28 PM TECHNIQUE: Multiplanar multisequence MRI of the head/brain was performed without the administration of intravenous contrast. COMPARISON: CT head from earlier today. CLINICAL HISTORY: Transient ischemic attack (TIA). FINDINGS: BRAIN AND VENTRICLES: No acute infarct. No intracranial hemorrhage. No mass. No midline shift. No hydrocephalus. Normal flow voids. ORBITS: No acute abnormality. SINUSES AND MASTOIDS: No acute abnormality. BONES AND SOFT TISSUES: Normal marrow signal. No  acute soft tissue abnormality. IMPRESSION: 1. No acute intracranial abnormality. Electronically signed by: Gilmore Molt MD 07/26/2024 11:16 PM EDT RP Workstation: [id number]   CT ANGIO HEAD NECK W WO CM Result Date: 07/26/2024 EXAM: CTA HEAD AND NECK WITHOUT AND WITH 07/26/2024 10:14:00 PM TECHNIQUE: CTA of the head and neck was performed without and with the administration of 75mL intravenous contrast (iohexol  (OMNIPAQUE ) 350 MG/ML injection 75 mL IOHEXOL  350 MG/ML SOLN). Multiplanar 2D and/or 3D reformatted images are provided for review. Automated exposure control, iterative reconstruction, and/or weight based adjustment of the mA/kV was utilized to reduce the radiation dose to as low as reasonably achievable. Stenosis of the internal carotid arteries measured using NASCET criteria. COMPARISON: CTA neck 10/02/21 CLINICAL HISTORY: Transient ischemic attack (TIA). FINDINGS: AORTIC ARCH AND ARCH VESSELS: No dissection or arterial injury. No significant stenosis of the brachiocephalic or subclavian arteries. CERVICAL CAROTID ARTERIES: No dissection, arterial injury, or hemodynamically significant stenosis by NASCET criteria. CERVICAL VERTEBRAL ARTERIES: No dissection, arterial injury, or significant stenosis. LUNGS AND MEDIASTINUM: Unremarkable. SOFT TISSUES: No acute abnormality. BONES: No acute abnormality. ANTERIOR CIRCULATION: No significant stenosis of the internal carotid arteries. No significant stenosis of the anterior cerebral arteries. No significant stenosis of the middle cerebral arteries. No aneurysm. POSTERIOR CIRCULATION: No significant stenosis of the posterior cerebral arteries. No significant stenosis of the basilar artery. No significant stenosis of the vertebral arteries. No aneurysm. OTHER: No dural venous sinus thrombosis on this non-dedicated study. IMPRESSION: 1. No large vessel occlusion or hemodynamically significant stenosis. Electronically signed by: Gilmore Molt MD 07/26/2024  10:43 PM EDT RP Workstation: [id number]   CT Head Wo Contrast Result Date: 07/26/2024 CLINICAL DATA:  Altered level  of consciousness, confusion, loss of balance EXAM: CT HEAD WITHOUT CONTRAST TECHNIQUE: Contiguous axial images were obtained from the base of the skull through the vertex without intravenous contrast. RADIATION DOSE REDUCTION: This exam was performed according to the departmental dose-optimization program which includes automated exposure control, adjustment of the mA and/or kV according to patient size and/or use of iterative reconstruction technique. COMPARISON:  None Available. FINDINGS: Brain: No acute infarct or hemorrhage. Lateral ventricles and midline structures are unremarkable. No acute extra-axial fluid collections. No mass effect. Vascular: No hyperdense vessel or unexpected calcification. Skull: Normal. Negative for fracture or focal lesion. Sinuses/Orbits: No acute finding. Other: None. IMPRESSION: 1. No acute intracranial process. Electronically Signed   By: Ozell Daring M.D.   On: 07/26/2024 16:02     Time coordinating discharge: Over 30 minutes    Alm Apo, MD  [facility] 07/27/2024, 5:39 PM

## 2024-07-27 NOTE — Progress Notes (Signed)
 Echocardiogram 2D Echocardiogram has been performed.  Todd Watson 07/27/2024, 10:48 AM

## 2024-07-27 NOTE — Consult Note (Signed)
 NEUROLOGY CONSULT NOTE   Date of service: July 27, 2024 Patient Name: Todd Watson MRN:  [id number] DOB:  September 19, 1941 Chief Complaint: Transient episode of aphasia Requesting Provider: Tobie Jorie SAUNDERS, MD  History of Present Illness  Todd Watson is a 83 y.o. male with a history of diabetes, hypertension, hyperlipidemia who had a transient episode of speech difficulty.  He was at his PCPs office, and began bumping his wife into the wall as he was wheeling her down.  There was report of staring off into space as well as difficulty speaking.  The patient seems mostly amnestic to the event.  He does remember his PCP telling him that he did not seem like his normal self.  He does not remember bumping of the wall multiple times, and does not remember having trouble speaking.  By the time of evaluation in the emergency department, he had returned to his normal self.  Past History   Past Medical History:  Diagnosis Date   Arthritis    GERD (gastroesophageal reflux disease)    Herpes zoster    Hiatal hernia    Hypothyroidism    Osteoarthritis of lumbar spine     Past Surgical History:  Procedure Laterality Date   APPENDECTOMY  2000   INGUINAL HERNIA REPAIR Bilateral    LUMBAR LAMINECTOMY/DECOMPRESSION MICRODISCECTOMY Right 01/28/2020   Procedure: Microdiscectomy - right - Lumbar four-Lumbar five;  Surgeon: Onetha Kuba, MD;  Location[facility] OR;  Service: Neurosurgery;  Laterality: Right;   SPINE SURGERY     spurs    Family History: Family History  Problem Relation Age of Onset   Heart disease Father        Valve disease   Kidney failure Mother     Social History  reports that he has never smoked. He has never used smokeless tobacco. He reports that he does not drink alcohol and does not use drugs.  No Known Allergies  Medications   Current Facility-Administered Medications:     stroke: early stages of recovery book, , Does not apply, Once, Tobie Jorie SAUNDERS, MD    acetaminophen  (TYLENOL ) tablet 650 mg, 650 mg, Oral, Q4H PRN **OR** acetaminophen  (TYLENOL ) 160 MG/5ML solution 650 mg, 650 mg, Per Tube, Q4H PRN **OR** acetaminophen  (TYLENOL ) suppository 650 mg, 650 mg, Rectal, Q4H PRN, Tobie Jorie SAUNDERS, MD   aspirin  EC tablet 81 mg, 81 mg, Oral, Daily, Patel, Vishal R, MD, 81 mg at 07/26/24 2254   atorvastatin  (LIPITOR) tablet 10 mg, 10 mg, Oral, Daily, Patel, Vishal R, MD   enoxaparin (LOVENOX) injection 40 mg, 40 mg, Subcutaneous, Q24H, Patel, Vishal R, MD, 40 mg at 07/26/24 2254   hydrALAZINE (APRESOLINE) injection 10 mg, 10 mg, Intravenous, Q6H PRN, Tobie, Vishal R, MD   insulin aspart (novoLOG) injection 0-9 Units, 0-9 Units, Subcutaneous, TID WC, Patel, Vishal R, MD   levothyroxine  (SYNTHROID ) tablet 88 mcg, 88 mcg, Oral, QAC breakfast, Patel, Vishal R, MD   ondansetron  (ZOFRAN ) injection 4 mg, 4 mg, Intravenous, Q6H PRN, Tobie, Vishal R, MD   senna-docusate (Senokot-S) tablet 1 tablet, 1 tablet, Oral, QHS PRN, Tobie Jorie R, MD   tamsulosin  (FLOMAX ) capsule 0.4 mg, 0.4 mg, Oral, QHS, Patel, Vishal R, MD, 0.4 mg at 07/26/24 2254  Vitals   Vitals:   07/26/24 2011 07/26/24 2200 07/26/24 2255 07/26/24 2315  BP: (!) 182/93 (!) 170/90 (!) 194/96 (!) 171/105  Pulse: (!) 53 (!) 54 (!) 52 (!) 55  Resp: 10 16 17 20   Temp:  97.6 F (36.4 C)  TempSrc:    Axillary  SpO2: 97% 100% 99% 99%  Weight:      Height:        Body mass index is 24.28 kg/m.   Physical Exam   Constitutional: Appears well-developed and well-nourished.   Neurologic Examination    Neuro: Mental Status: Patient is awake, alert, oriented to person, place, month, year, and situation. Patient is able to give a clear and coherent history. No signs of aphasia or neglect Cranial Nerves: II: Visual Fields are full. Pupils are equal, round, and reactive to light.   III,IV, VI: EOMI without ptosis or diploplia.  V: Facial sensation is symmetric to temperature VII: Facial movement  is symmetric.  VIII: hearing is intact to voice X: Uvula elevates symmetrically XII: tongue is midline without atrophy or fasciculations.  Motor: Tone is normal. Bulk is normal. 5/5 strength was present in all four extremities.  Sensory: Sensation is symmetric to light touch and temperature in the arms and legs. Cerebellar: FNF intact bilaterally        Labs/Imaging/Neurodiagnostic studies   CBC:  Recent Labs  Lab 08/14/24 1452  WBC 5.4  HGB 12.8*  HCT 38.5*  MCV 95.1  PLT 161   Basic Metabolic Panel:  Lab Results  Component Value Date   NA 140 08-14-2024   K 3.9 2024-08-14   CO2 23 08/14/2024   GLUCOSE 111 (H) 08/14/2024   BUN 10 August 14, 2024   CREATININE 0.86 2024/08/14   CALCIUM  8.4 (L) Aug 14, 2024   GFRNONAA >60 08/14/2024   GFRAA >60 01/28/2020   Lipid Panel:  Lab Results  Component Value Date   LDLCALC 95 08/25/2023   HgbA1c:  Lab Results  Component Value Date   HGBA1C 6.7 (H) 08/25/2023    CT Head without contrast(Personally reviewed): CT head is negative  CT angio Head and Neck with contrast(Personally reviewed): No significant stenosis  MRI Brain(Personally reviewed): Negative   ASSESSMENT   Todd Watson is a 83 y.o. male with a transient confusional episode.  The etiology of this event is unclear, but the description is unusual for TIA.  The fact that he appears to be amnestic to the event itself could be suggestive of seizure but I would not be confident enough to start treatment based on the episode alone.  He is undergoing a TIA workup and I think this is reasonable, but I will also order an EEG.  I would not start antiepileptics unless the EEG demonstrated evidence of predisposition.  RECOMMENDATIONS  EEG TSH, ammonia Follow-up echocardiogram, lipids, A1c Antiplatelet therapy with aspirin  Echo, telemetry ______________________________________________________________________    Signed, Aisha Seals, MD [facility]

## 2024-07-27 NOTE — Evaluation (Signed)
 Occupational Therapy Evaluation Patient Details Name: Todd Watson MRN: [id number] DOB: [birth year] Today's Date: 07/27/2024   History of Present Illness   Pt is an 83 year old man admitted on 07/26/24 after transient inability to speak while at the dr's office with his wife. MRI negative for acute brain abnormality PMH: DM, HTN, HLD, sciatica, arthritis.     Clinical Impressions Pt is typically independent in ADLs, drives and is the caregiver for his wife. He typically manages his own meds and his daughter assists with finances. 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 He and his wife have a housekeeper who also helps with laundry. Pt endorses some memory deficits he attributes to aging. He does not recall the events leading up to admission. Pt is functioning at his baseline in self care and mobility, mild unsteadiness with walking and talking (pt is HOH). Will follow to further assess cognition.      If plan is discharge home, recommend the following:   Direct supervision/assist for medications management;Direct supervision/assist for financial management     Functional Status Assessment   Patient has had a recent decline in their functional status and demonstrates the ability to make significant improvements in function in a reasonable and predictable amount of time.     Equipment Recommendations   None recommended by OT     Recommendations for Other Services         Precautions/Restrictions   Precautions Precautions: Fall Precaution/Restrictions Comments: denies any falls     Mobility Bed Mobility Overal bed mobility: Independent                  Transfers Overall transfer level: Independent Equipment used: None                      Balance Overall balance assessment: Needs assistance   Sitting balance-Leahy Scale: Normal Sitting balance - Comments: donned socks without LOB, picking sock up from the floor     Standing balance-Leahy  Scale: Fair                             ADL either performed or assessed with clinical judgement   ADL Overall ADL's : At baseline                                             Vision Baseline Vision/History: 1 Wears glasses Ability to See in Adequate Light: 0 Adequate Patient Visual Report: No change from baseline       Perception         Praxis         Pertinent Vitals/Pain Pain Assessment Pain Assessment: No/denies pain     Extremity/Trunk Assessment Upper Extremity Assessment Upper Extremity Assessment: Overall WFL for tasks assessed;Right hand dominant   Lower Extremity Assessment Lower Extremity Assessment: Defer to PT evaluation   Cervical / Trunk Assessment Cervical / Trunk Assessment: Normal   Communication Communication Communication: Impaired Factors Affecting Communication: Hearing impaired   Cognition Arousal: Alert Behavior During Therapy: WFL for tasks assessed/performed Cognition: Cognition impaired       Memory impairment (select all impairments): Short-term memory     OT - Cognition Comments: per family, pt is at baseline, pt endorses baseline memory problems intermittently, manages his own meds, daughter manages finances  Following commands: Intact       Cueing  General Comments   Cueing Techniques: Verbal cues      Exercises     Shoulder Instructions      Home Living Family/patient expects to be discharged to:: Private residence Living Arrangements: Spouse/significant other;Other (Comment) (pt is wife's caregiver) Available Help at Discharge: Family;Available 24 hours/day;Available PRN/intermittently (wife is home all the time, children can check on him PRN) Type of Home: House Home Access: Ramped entrance     Home Layout: One level;Laundry or work area in basement     [employer] Shower/Tub: Chief Strategy Officer: Standard     Home Equipment: Grab bars -  tub/shower;Other (comment) (toilet riser?)          Prior Functioning/Environment Prior Level of Function : Needs assist             Mobility Comments: independent, no AD ADLs Comments: sells wood and gravel, drives, assisted for housekeeping and laundry by paid help, daughter manages finances    OT Problem List: Decreased cognition   OT Treatment/Interventions: Cognitive remediation/compensation      OT Goals(Current goals can be found in the care plan section)   Acute Rehab OT Goals OT Goal Formulation: With patient Time For Goal Achievement: 08/10/24 Potential to Achieve Goals: Good ADL Goals Additional ADL Goal #1: Pt will participate in formal cognitive screening.   OT Frequency:  Min 2X/week    Co-evaluation              AM-PAC OT 6 Clicks Daily Activity     Outcome Measure Help from another person eating meals?: None Help from another person taking care of personal grooming?: None Help from another person toileting, which includes using toliet, bedpan, or urinal?: None Help from another person bathing (including washing, rinsing, drying)?: None Help from another person to put on and taking off regular upper body clothing?: None Help from another person to put on and taking off regular lower body clothing?: None 6 Click Score: 24   End of Session Equipment Utilized During Treatment: Gait belt  Activity Tolerance: Patient tolerated treatment well Patient left: in bed;with call bell/phone within reach;with family/visitor present;Other (comment) (MD present)  OT Visit Diagnosis: Unsteadiness on feet (R26.81);Other symptoms and signs involving cognitive function                Time: 1045-1108 OT Time Calculation (min): 23 min Charges:  OT General Charges $OT Visit: 1 Visit OT Evaluation $OT Eval Low Complexity: 1 Low OT Treatments $Self Care/Home Management : 8-22 mins  Mliss HERO, OTR/L Acute Rehabilitation Services Office: [phone]    Todd Watson 07/27/2024, 11:26 AM

## 2024-07-27 NOTE — Progress Notes (Signed)
 SLP Cancellation Note  Patient Details Name: RJ PEDROSA MRN: [id number] DOB: 07-16-1941   Cancelled treatment:       Reason Eval/Treat Not Completed: Patient at procedure or test/unavailable (Second attempt. Pt undergoing echo. Will continue efforts as able.)  Delon Bangs, M.S., CCC-SLP Speech-Language Pathologist Secure Chat Preferred  O: [phone]  Delon CHRISTELLA Bangs 07/27/2024, 10:41 AM

## 2024-07-27 NOTE — Procedures (Signed)
 Patient Name: Todd Watson  MRN: [id number]  Epilepsy Attending: Arlin MALVA Krebs  Referring Physician/Provider: Michaela Aisha SQUIBB, MD  Date: 07/27/2024 Duration: 22.19 mins  Patient history: 83 y.o. male with a transient confusional episode. EEG to evaluate for seizure  Level of alertness: Awake  AEDs during EEG study: None  Technical aspects: This EEG study was done with scalp electrodes positioned according to the 10-20 International system of electrode placement. Electrical activity was reviewed with band pass filter of 1-70Hz , sensitivity of 7 uV/mm, display speed of 46mm/sec with a 60Hz  notched filter applied as appropriate. EEG data were recorded continuously and digitally stored.  Video monitoring was available and reviewed as appropriate.  Description: The posterior dominant rhythm consists of 8-9 Hz activity of moderate voltage (25-35 uV) seen predominantly in posterior head regions, symmetric and reactive to eye opening and eye closing. Drowsiness was characterized by attenuation of the posterior background rhythm. Hyperventilation and photic stimulation were not performed.     IMPRESSION: This study is within normal limits. No seizures or epileptiform discharges were seen throughout the recording. However, only wakefulness and drowsiness were recorded.   A normal interictal EEG does not exclude the diagnosis of epilepsy.   Ranisha Allaire O Daoud Lobue

## 2024-07-27 NOTE — Progress Notes (Signed)
 PT Cancellation Note  Patient Details Name: Todd Watson MRN: [id number] DOB: Jul 19, 1941   Cancelled Treatment:    Reason Eval/Treat Not Completed: (P) Patient at procedure or test/unavailable Pt is receiving ECHO. PT will follow back for treatment as able.   Todd Watson B. Fleeta Lapidus PT, DPT Acute Rehabilitation Services Please use secure chat or  Call Office [phone]    Todd Watson Fleeta [facility] 07/27/2024, 10:27 AM

## 2024-07-27 NOTE — Progress Notes (Signed)
 EEG complete - results pending

## 2024-07-27 NOTE — Evaluation (Signed)
 Physical Therapy Brief Evaluation and Discharge Note Patient Details Name: Todd Watson MRN: [id number] DOB: 08/21/1941 Today's Date: 07/27/2024   History of Present Illness  Pt is an 83 year old man admitted on 07/26/24 after transient inability to speak while at the dr's office with his wife. MRI negative for acute brain abnormality PMH: DM, HTN, HLD, sciatica, arthritis.  Clinical Impression  At baseline, pt primary caregiver for his wife with cognitive and mobility deficits. Pt often drives to local restaurants to pick up meals. 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 Pt has been advised that he should not be driving after this episode. Pt is obviously not happy with this decision. Pt family reports they will continue handling the finances and will start assisting with medications management. For safety, pt ambulates at a supervision level as he has mild balance deficits enhanced by dual tasking with talking. Pt is able to get up and down a flight of steps with supervision and handrail use. Pt has no PT or equipment needs at discharge. Have referred to Mobility Specialist. PT signing off.       PT Assessment Patient does not need any further PT services  Assistance Needed at Discharge  Intermittent Supervision/Assistance    Equipment Recommendations None recommended by PT     Precautions/Restrictions Precautions Precautions: Fall Precaution/Restrictions Comments: denies any falls Restrictions Weight Bearing Restrictions Per Provider Order: No        Mobility  Bed Mobility   Supine/Sidelying to sit: Independent      Transfers Overall transfer level: Independent Equipment used: None                    Ambulation/Gait Ambulation/Gait assistance: Supervision Gait Distance (Feet): 500 Feet Assistive device: None Gait Pattern/deviations: Step-through pattern, Decreased weight shift to left Gait Speed: Pace WFL General Gait  Details: supervision for safety, R LE weaker due to sciatica  Home Activity Instructions    Stairs Stairs: Yes Stairs assistance: Supervision Stair Management: One rail Right, Forwards, Alternating pattern Number of Stairs: 10 General stair comments: able to ascend/descent steps with step over step and use of rail to steady  Modified Rankin (Stroke Patients Only) Modified Rankin (Stroke Patients Only) Pre-Morbid Rankin Score: No symptoms Modified Rankin: No significant disability      Balance Overall balance assessment: Mild deficits observed, not formally tested                        Pertinent Vitals/Pain PT - Brief Vital Signs All Vital Signs Stable: Yes Pain Assessment Pain Assessment: No/denies pain     Home Living Family/patient expects to be discharged to:: Private residence Living Arrangements: Spouse/significant other;Other (Comment) (pt is wife's caregiver) Available Help at Discharge: Family;Available PRN/intermittently Home Environment: Ramped entrance   Home Equipment: Grab bars - tub/shower;Other (comment) (toilet riser?)   Additional Comments: ramped entrance into home, pt works in basement often    Prior Function Level of Independence: Independent Comments: primary caregiver for his wife with cognitive and mobility decline, often drives to resteraunts for meals    UE/LE Assessment   UE ROM/Strength/Tone/Coordination: WFL    LE ROM/Strength/Tone/Coordination: [facility]      Communication   Communication Communication: Impaired Factors Affecting Communication: Hearing impaired     Cognition Overall Cognitive Status: Impaired Comments: poor safety awareness     General Comments General comments (skin integrity, edema, etc.): pt talks constantly, partially baseline state, but also  starts stories when being advised on things that he should not be doing like driving his tractor     PT Visit Diagnosis Unsteadiness on feet (R26.81)     No Skilled PT All education completed;Patient at baseline level of functioning;Patient is supervision for all activity/mobility;Patient will have necessary level of assist by caregiver at discharge    AMPAC 6 Clicks Help needed turning from your back to your side while in a flat bed without using bedrails?: None Help needed moving from lying on your back to sitting on the side of a flat bed without using bedrails?: None Help needed moving to and from a bed to a chair (including a wheelchair)?: None Help needed standing up from a chair using your arms (e.g., wheelchair or bedside chair)?: None Help needed to walk in hospital room?: None Help needed climbing 3-5 steps with a railing? : None 6 Click Score: 24      End of Session Equipment Utilized During Treatment: Gait belt Activity Tolerance: Patient tolerated treatment well Patient left: in chair;with bed alarm set;Other (comment) (SLP in room for SLE) Nurse Communication: Mobility status PT Visit Diagnosis: Unsteadiness on feet (R26.81)     Time: 8873-8853 PT Time Calculation (min) (ACUTE ONLY): 20 min  Charges:   PT Evaluation $PT Eval Low Complexity: 1 Low      Omran Keelin B. Fleeta Lapidus PT, DPT Acute Rehabilitation Services Please use secure chat or  Call Office [phone]   Almarie KATHEE Fleeta Fleet  07/27/2024, 12:05 PM

## 2024-07-27 NOTE — Progress Notes (Addendum)
 NEUROLOGY CONSULT FOLLOW UP NOTE   Date of service: July 27, 2024 Patient Name: Todd Watson MRN:  [id number] DOB:  [birth year]  Interval Hx/subjective   No acute neurological events overnight.  Patient lying in bed. No family at bedside.   Vitals   Vitals:   07/26/24 2200 07/26/24 2255 07/26/24 2315 07/27/24 0500  BP: (!) 170/90 (!) 194/96 (!) 171/105 127/67  Pulse: (!) 54 (!) 52 (!) 55 (!) 51  Resp: 16 17 20 17   Temp:   97.6 F (36.4 C) 98.7 F (37.1 C)  TempSrc:   Axillary Oral  SpO2: 100% 99% 99% 95%  Weight:      Height:         Body mass index is 24.28 kg/m.  Physical Exam   Constitutional: Appears elderly, well-nourished.  Cardiovascular: Normal rate and regular rhythm.  Respiratory: Effort normal, non-labored breathing.   Neurologic Examination   Neuro: Mental Status: Patient is awake, alert, oriented to person, place, month, year, and situation. Patient is able to give a clear and coherent history. No signs of aphasia or neglect Cranial Nerves: II: Visual Fields are full. Pupils are equal, round, and reactive to light.   III,IV, VI: EOMI without ptosis or diploplia.  V: Facial sensation is symmetric to temperature VII: Facial movement is symmetric.  VIII: hearing is intact to voice X: Uvula elevates symmetrically. No dysarthria.  XI: Shoulder shrug is symmetric. XII: tongue is midline without atrophy or fasciculations.  Motor: Tone is normal. Bulk is normal. 5/5 strength was present in all four extremities.  Sensory: Sensation is symmetric to light touch and temperature in the arms and legs. Cerebellar: FNF and HKS are slow but intact bilaterally   Medications  Current Facility-Administered Medications:     stroke: early stages of recovery book, , Does not apply, Once, Tobie Jorie SAUNDERS, MD   acetaminophen  (TYLENOL ) tablet 650 mg, 650 mg, Oral, Q4H PRN **OR** acetaminophen  (TYLENOL ) 160 MG/5ML solution 650 mg, 650 mg, Per Tube, Q4H PRN **OR**  acetaminophen  (TYLENOL ) suppository 650 mg, 650 mg, Rectal, Q4H PRN, Tobie Jorie SAUNDERS, MD   aspirin  EC tablet 81 mg, 81 mg, Oral, Daily, Tobie Jorie R, MD, 81 mg at 07/26/24 2254   atorvastatin  (LIPITOR) tablet 10 mg, 10 mg, Oral, Daily, Patel, Vishal R, MD   enoxaparin (LOVENOX) injection 40 mg, 40 mg, Subcutaneous, Q24H, Patel, Vishal R, MD, 40 mg at 07/26/24 2254   hydrALAZINE (APRESOLINE) injection 10 mg, 10 mg, Intravenous, Q6H PRN, Tobie, Vishal R, MD   insulin aspart (novoLOG) injection 0-9 Units, 0-9 Units, Subcutaneous, TID WC, Patel, Vishal R, MD   levothyroxine  (SYNTHROID ) tablet 88 mcg, 88 mcg, Oral, QAC breakfast, Patel, Vishal R, MD   ondansetron  (ZOFRAN ) injection 4 mg, 4 mg, Intravenous, Q6H PRN, Tobie, Vishal R, MD   senna-docusate (Senokot-S) tablet 1 tablet, 1 tablet, Oral, QHS PRN, Tobie Jorie SAUNDERS, MD   tamsulosin  (FLOMAX ) capsule 0.4 mg, 0.4 mg, Oral, QHS, Patel, Jorie R, MD, 0.4 mg at 07/26/24 2254  Labs and Diagnostic Imaging   CBC:  Recent Labs  Lab 07/26/24 1452 07/27/24 0555  WBC 5.4 5.3  HGB 12.8* 12.4*  HCT 38.5* 36.4*  MCV 95.1 92.9  PLT 161 142*    Basic Metabolic Panel:  Lab Results  Component Value Date   NA 138 07/27/2024   K 3.7 07/27/2024   CO2 24 07/27/2024   GLUCOSE 101 (H) 07/27/2024   BUN 8 07/27/2024   CREATININE 0.89 07/27/2024  CALCIUM  8.1 (L) 07/27/2024   GFRNONAA >60 07/27/2024   GFRAA >60 01/28/2020   Lipid Panel:  Lab Results  Component Value Date   LDLCALC 84 07/27/2024   HgbA1c:  Lab Results  Component Value Date   HGBA1C 6.0 (H) 07/27/2024   Urine Drug Screen: No results found for: LABOPIA, COCAINSCRNUR, LABBENZ, AMPHETMU, THCU, LABBARB  Alcohol Level No results found for: ETH INR No results found for: INR APTT No results found for: APTT AED levels: No results found for: PHENYTOIN, ZONISAMIDE, LAMOTRIGINE, LEVETIRACETA   CT Head without contrast(Personally reviewed): CT head is  negative   CT angio Head and Neck with contrast(Personally reviewed): No significant stenosis   MRI Brain(Personally reviewed): Negative  rEEG:  PENDING  Assessment   SONNIE PAWLOSKI is a 83 y.o. male with a transient confusional episode.  The etiology of this event is unclear, but the description is unusual for TIA. CT/MRI negative. LDL is slightly above goal, would continue Lipitor 80mg  and have patient follow closely with PCP. Will also discuss possible addition of Zetia with patient. A1c is within goal. Patient has been started on Aspirin .   The fact that he appears to be amnestic to the event itself could be suggestive of seizure but would be odd to have a first time seizure at his age. I would not be confident enough to start treatment based on the episode alone. His EEG and CT Head are negative. He does have significantly elevated TSH which could cause orthostasis/poor attention leading up to this episode. TIA workup has been ordered and so far non revealing.  Recommendations   - followup with PCP on hypothyroidism - no driving while working on elevted TSH with PCP, until these are normalized.  - Dementia panel labs (ordered)  Followup with PCP - start Po multivitamin.  TIA workup - Continue ASA 81mg  daily, continue on discharge - Continue Lipitor 80mg , may need to add Zetia - Pending echo. - PT/OT eval - Stroke/TIA education  Patient is OK for discharge from neurology standpoint, with recommendations as above, once echo results are in and unremarkable. Follow-up with outpatient neurology in 8 weeks.    _____________________________________________________________________   Bonney Rocky JAYSON Judithe, NP [facility]    NEUROHOSPITALIST ADDENDUM Performed a face to face diagnostic evaluation.   I have reviewed the contents of history and physical exam as documented by PA/ARNP/Resident and agree with above documentation.  I have discussed and formulated the  above plan as documented. Edits to the note have been made as needed.  Danis Pembleton, MD [facility] [phone]   If 7pm to 7am, please call on call as listed on AMION.

## 2024-07-29 LAB — T3: T3, Total: 78 ng/dL (ref 71–180)

## 2024-07-30 ENCOUNTER — Telehealth: Payer: Self-pay | Admitting: Adult Health

## 2024-07-30 ENCOUNTER — Telehealth: Payer: Self-pay

## 2024-07-30 LAB — VITAMIN B1: Vitamin B1 (Thiamine): 108.3 nmol/L (ref 66.5–200.0)

## 2024-07-30 NOTE — Transitions of Care (Post Inpatient/ED Visit) (Unsigned)
   07/30/2024  Name: Todd Watson MRN: [id number] DOB: 03/25/1941  Today's TOC FU Call Status: Today's TOC FU Call Status:: Unsuccessful Call (1st Attempt) Unsuccessful Call (1st Attempt) Date: 07/30/24  Attempted to reach the patient regarding the most recent Inpatient/ED visit.  Follow Up Plan: Additional outreach attempts will be made to reach the patient to complete the Transitions of Care (Post Inpatient/ED visit) call.   Signature Julian Lemmings, LPN [facility] Nurse Health Advisor Direct Dial [phone]

## 2024-07-30 NOTE — Telephone Encounter (Signed)
 Prescription Request  07/30/2024  LOV: 02/02/2024. Pt has hospital fup on 08-07-2024  What is the name of the medication or equipment?  Tamsulosin  0.4 mg, metformin 500 mg, omeprazole 20 mg  Have you contacted your pharmacy to request a refill? No   Which pharmacy would you like this sent to?   [facility] - [place], [place] - [address] Phone: [phone]  Fax: [phone]      Patient notified that their request is being sent to the clinical staff for review and that they should receive a response within 2 business days.   Please advise at [facility] [phone]

## 2024-07-31 MED ORDER — OMEPRAZOLE 20 MG PO CPDR: 20.0000 mg | DELAYED_RELEASE_CAPSULE | Freq: Every day | ORAL | 0 refills | Status: DC

## 2024-07-31 MED ORDER — METFORMIN HCL 500 MG PO TABS: 500.0000 mg | ORAL_TABLET | Freq: Every day | ORAL | 0 refills | Status: DC

## 2024-07-31 MED ORDER — TAMSULOSIN HCL 0.4 MG PO CAPS: 0.4000 mg | ORAL_CAPSULE | Freq: Every day | ORAL | 0 refills | Status: DC

## 2024-07-31 NOTE — Telephone Encounter (Signed)
 Ok to send these in?

## 2024-07-31 NOTE — Telephone Encounter (Signed)
 Prescription sent to pharmacy.

## 2024-07-31 NOTE — Transitions of Care (Post Inpatient/ED Visit) (Signed)
 07/31/2024  Name: Todd Watson MRN: [id number] DOB: [birth year]  Today's TOC FU Call Status: Today's TOC FU Call Status:: Successful TOC FU Call Completed Unsuccessful Call (1st Attempt) Date: 07/30/24 [facility] FU Call Complete Date: 07/31/24 Patient's Name and Date of Birth confirmed.  Transition Care Management Follow-up Telephone Call Date of Discharge: 07/27/24 Discharge Facility: [facility]) Type of Discharge: Inpatient Admission Primary Inpatient Discharge Diagnosis:: TIA How have you been since you were released from the hospital?: Better Any questions or concerns?: No  Items Reviewed: Did you receive and understand the discharge instructions provided?: Yes Medications obtained,verified, and reconciled?: Yes (Medications Reviewed) Any new allergies since your discharge?: No Dietary orders reviewed?: Yes Do you have support at home?: Yes People in Home [RPT]: spouse  Medications Reviewed Today: Medications Reviewed Today     Reviewed by Emmitt Pan, LPN (Licensed Practical Nurse) on 07/31/24 at 1224  Med List Status: <None>   Medication Order Taking? Sig Documenting Provider Last Dose Status Informant  aspirin  EC 81 MG tablet [id number] Yes Take 1 tablet (81 mg total) by mouth daily. Swallow whole. Patsy Lenis, MD  Active   atorvastatin  (LIPITOR) 10 MG tablet [id number] Yes Take 10 mg by mouth daily. [provider]  Active Self, Pharmacy Records  blood glucose meter kit and supplies KIT [id number] Yes Dispense based on patient and insurance preference. Use up to four times daily as directed. Nafziger, Darleene, NP  Active Self, Pharmacy Records  Boswellia-Glucosamine-Vit D (OSTEO BI-FLEX ONE PER DAY PO) [id number] Yes Take 1 tablet by mouth daily. [provider]  Active Self, Pharmacy Records           Med Note [facility], DONETA GORMAN Schaumann Jul 26, 2024  9:14 PM) Patient doesn't remember taking this medication.  levothyroxine  (SYNTHROID ) 88 MCG tablet  [id number] Yes Take 88 mcg by mouth daily before breakfast. [provider]  Active Self, Pharmacy Records  lisinopril  (PRINIVIL ,ZESTRIL ) 10 MG tablet [id number] Yes Take 10 mg by mouth in the morning and at bedtime. [provider]  Active Self, Pharmacy Records  metFORMIN (GLUCOPHAGE) 500 MG tablet [id number] Yes Take by mouth daily with breakfast. [provider]  Active Self, Pharmacy Records  Multiple Vitamin (MULTIVITAMIN) tablet [id number] Yes Take 1 tablet by mouth daily. [provider]  Active Self, Pharmacy Records           Med Note [facility], DONETA GORMAN Schaumann Jul 26, 2024  9:19 PM) Patient doesn't remember taking this medication, but its listed as an active medication per [facility].   omeprazole (PRILOSEC) 20 MG capsule [id number] Yes Take 20 mg by mouth daily. May take 1 additional capsule in the evening as needed for heartburn [provider]  Active Self, Pharmacy Records  Probiotic Product (PROBIOTIC DAILY PO) [id number] Yes Take 1 capsule by mouth daily. [provider]  Active Self, Pharmacy Records           Med Note [facility], DONETA GORMAN Schaumann Jul 26, 2024  9:27 PM)    tamsulosin  (FLOMAX ) 0.4 MG CAPS capsule [id number] Yes Take 0.4 mg by mouth at bedtime. [provider]  Active Self, Pharmacy Records  vitamin C (ASCORBIC ACID ) 500 MG tablet [id number] Yes Take 500 mg by mouth daily. [provider]  Active Self, Pharmacy Records           Med Note [facility], DONETA GORMAN Schaumann Jul 26, 2024  9:15 PM) Patient doesn't remember taking  this medication, but its listed as an active medication per [facility].            Home Care and Equipment/Supplies: Were Home Health Services Ordered?: NA Any new equipment or medical supplies ordered?: NA  Functional Questionnaire: Do you need assistance with bathing/showering or dressing?: No Do you need assistance with meal preparation?: No Do you need assistance with eating?: No Do you have difficulty  maintaining continence: No Do you need assistance with getting out of bed/getting out of a chair/moving?: No Do you have difficulty managing or taking your medications?: No  Follow up appointments reviewed: PCP Follow-up appointment confirmed?: Yes Date of PCP follow-up appointment?: 08/07/24 Follow-up Provider: [facility] Follow-up appointment confirmed?: NA Do you need transportation to your follow-up appointment?: No Do you understand care options if your condition(s) worsen?: Yes-patient verbalized understanding    SIGNATURE Julian Lemmings, LPN [facility] Nurse Health Advisor Direct Dial [phone]

## 2024-08-01 NOTE — Telephone Encounter (Signed)
 Patient notified of update  and verbalized understanding.

## 2024-08-07 ENCOUNTER — Encounter: Payer: Self-pay | Admitting: Adult Health

## 2024-08-07 ENCOUNTER — Ambulatory Visit (INDEPENDENT_AMBULATORY_CARE_PROVIDER_SITE_OTHER): Admitting: Adult Health

## 2024-08-07 VITALS — BP 120/60 | HR 60 | Temp 97.6°F | Ht 67.0 in | Wt 156.0 lb

## 2024-08-07 DIAGNOSIS — I152 Hypertension secondary to endocrine disorders: Secondary | ICD-10-CM

## 2024-08-07 DIAGNOSIS — I10 Essential (primary) hypertension: Secondary | ICD-10-CM | POA: Diagnosis not present

## 2024-08-07 DIAGNOSIS — R410 Disorientation, unspecified: Secondary | ICD-10-CM

## 2024-08-07 DIAGNOSIS — E1169 Type 2 diabetes mellitus with other specified complication: Secondary | ICD-10-CM

## 2024-08-07 DIAGNOSIS — E785 Hyperlipidemia, unspecified: Secondary | ICD-10-CM

## 2024-08-07 DIAGNOSIS — E039 Hypothyroidism, unspecified: Secondary | ICD-10-CM | POA: Diagnosis not present

## 2024-08-07 DIAGNOSIS — Z7984 Long term (current) use of oral hypoglycemic drugs: Secondary | ICD-10-CM

## 2024-08-07 DIAGNOSIS — R001 Bradycardia, unspecified: Secondary | ICD-10-CM

## 2024-08-07 DIAGNOSIS — E1159 Type 2 diabetes mellitus with other circulatory complications: Secondary | ICD-10-CM | POA: Diagnosis not present

## 2024-08-07 DIAGNOSIS — E119 Type 2 diabetes mellitus without complications: Secondary | ICD-10-CM

## 2024-08-07 NOTE — Patient Instructions (Signed)
 Please schedule a lab appointment in two weeks   Follow up with Neurology as directed

## 2024-08-07 NOTE — Progress Notes (Signed)
 Subjective:    Patient ID: Todd Watson, male    DOB: 07/25/1941, 83 y.o.   MRN: [id number]  HPI 83 year old male who  has a past medical history of Arthritis, GERD (gastroesophageal reflux disease), Herpes zoster, Hiatal hernia, Hypothyroidism, and Osteoarthritis of lumbar spine.  He presents to the office today for TCM visit   Admit Date 07/26/2024 Discharge Date 07/27/2024  He presented to the emergency room for evaluation of confusion and aphasia.  Prior to going to the emergency room he was with his wife and her PCP appointment in this office.  He was noted to be wheeling his wife, hallway in her rolling walker and was walking into the wall.  When he got to the room he sat down and seemed confused from time hired.  He was staring off into space.  His wife noted that he appeared to be confused but was normal the day prior.  In the room he was holding received from the front desk but could not recall if the piece of paper was despite multiple reminders.  EMS was called and was taken to the ED for further evaluation.  At the time admitted he was awake, alert, fully oriented and speaking appropriately.  He was evaluated by neurology admission and felt to be atypical for seizures or TIA.    CT of the head was unremarkable.  CT angio head/neck also negative for LVO or significant stenosis.  MRI of the brain negative for stroke or acute findings.  His EEG was unremarkable.  Time of discharge his echo was still pending  With further workup his TSH returned at 24.233, free T4 low at 0.31.  It appeared as though he had not been taking his Synthroid  consistently at home for unclear reasons but possibly due to some cognitive impairment from his wife who handles his thyroid  medication.  Family was consulted and made plans on obtaining a pill dispenser and frequent observation or medication titrations.  Today he presents to the office with his son. The patient reports that he is feeling much better  and nearly back to himself. His son reports that he has taken the keys away from his dad until he is seen by Neurology and Todd understands why. His son and daughter in law are also making sure that the patient and his wife are taking their pills correctly and have bought them a pill case to help organize their medications. They are also going to the grocery store and helping to provide them meals that they can cook at home instead of going out to eat so much.   His son has noticed that his dad continues to have mild transient episodes of confusion that last a few moments and then resolve.    Review of Systems  Constitutional: Negative.   HENT: Negative.    Eyes: Negative.   Respiratory: Negative.    Cardiovascular: Negative.   Gastrointestinal: Negative.   Endocrine: Negative.   Genitourinary: Negative.   Musculoskeletal: Negative.   Skin: Negative.   Allergic/Immunologic: Negative.   Neurological: Negative.   Hematological: Negative.   Psychiatric/Behavioral:  Positive for confusion.   All other systems reviewed and are negative.  Past Medical History:  Diagnosis Date   Arthritis    GERD (gastroesophageal reflux disease)    Herpes zoster    Hiatal hernia    Hypothyroidism    Osteoarthritis of lumbar spine     Social History   Socioeconomic History   Marital status:  Married    Spouse name: Not on file   Number of children: 3   Years of education: 10   Highest education level: GED or equivalent  Occupational History   Occupation: retired  Tobacco Use   Smoking status: Never   Smokeless tobacco: Never  Vaping Use   Vaping status: Never Used  Substance and Sexual Activity   Alcohol use: No   Drug use: No   Sexual activity: Yes  Other Topics Concern   Not on file  Social History Narrative   Lives with wife.    3 children     6 grandchildren & 5 great grandchildren   Likes to fish    Social Drivers of Health   Financial Resource Strain: Low Risk  (06/22/2024)    Overall Financial Resource Strain (CARDIA)    Difficulty of Paying Living Expenses: Not hard at all  Food Insecurity: No Food Insecurity (07/27/2024)   Hunger Vital Sign    Worried About Running Out of Food in the Last Year: Never true    Ran Out of Food in the Last Year: Never true  Transportation Needs: Unmet Transportation Needs (07/30/2024)   PRAPARE - Transportation    Lack of Transportation (Medical): Yes    Lack of Transportation (Non-Medical): Yes  Physical Activity: Inactive (06/22/2024)   Exercise Vital Sign    Days of Exercise per Week: 0 days    Minutes of Exercise per Session: 0 min  Stress: No Stress Concern Present (06/22/2024)   [employer] of Occupational Health - Occupational Stress Questionnaire    Feeling of Stress: Not at all  Social Connections: Socially Integrated (07/27/2024)   Social Connection and Isolation Panel    Frequency of Communication with Friends and Family: More than three times a week    Frequency of Social Gatherings with Friends and Family: More than three times a week    Attends Religious Services: More than 4 times per year    Active Member of Clubs or Organizations: Not on file    Attends Banker Meetings: More than 4 times per year    Marital Status: Married  Catering Manager Violence: Not At Risk (07/27/2024)   Humiliation, Afraid, Rape, and Kick questionnaire    Fear of Current or Ex-Partner: No    Emotionally Abused: No    Physically Abused: No    Sexually Abused: No    Past Surgical History:  Procedure Laterality Date   APPENDECTOMY  2000   INGUINAL HERNIA REPAIR Bilateral    LUMBAR LAMINECTOMY/DECOMPRESSION MICRODISCECTOMY Right 01/28/2020   Procedure: Microdiscectomy - right - Lumbar four-Lumbar five;  Surgeon: Onetha Kuba, MD;  Location: [facility] OR;  Service: Neurosurgery;  Laterality: Right;   SPINE SURGERY     spurs    Family History  Problem Relation Age of Onset   Heart disease Father        Valve disease    Kidney failure Mother     No Known Allergies  Current Outpatient Medications on File Prior to Visit  Medication Sig Dispense Refill   aspirin  EC 81 MG tablet Take 1 tablet (81 mg total) by mouth daily. Swallow whole.     atorvastatin  (LIPITOR) 10 MG tablet Take 10 mg by mouth daily.     blood glucose meter kit and supplies KIT Dispense based on patient and insurance preference. Use up to four times daily as directed. 1 each 0   Boswellia-Glucosamine-Vit D (OSTEO BI-FLEX ONE PER DAY PO) Take 1 tablet  by mouth daily.     levothyroxine  (SYNTHROID ) 88 MCG tablet Take 88 mcg by mouth daily before breakfast.     lisinopril  (PRINIVIL ,ZESTRIL ) 10 MG tablet Take 10 mg by mouth in the morning and at bedtime.     metFORMIN (GLUCOPHAGE) 500 MG tablet Take 1 tablet (500 mg total) by mouth daily with breakfast. 30 tablet 0   Multiple Vitamin (MULTIVITAMIN) tablet Take 1 tablet by mouth daily.     omeprazole (PRILOSEC) 20 MG capsule Take 1 capsule (20 mg total) by mouth daily. May take 1 additional capsule in the evening as needed for heartburn 30 capsule 0   Probiotic Product (PROBIOTIC DAILY PO) Take 1 capsule by mouth daily.     tamsulosin  (FLOMAX ) 0.4 MG CAPS capsule Take 1 capsule (0.4 mg total) by mouth at bedtime. 30 capsule 0   vitamin C (ASCORBIC ACID ) 500 MG tablet Take 500 mg by mouth daily.     No current facility-administered medications on file prior to visit.    BP 120/60   Pulse 60   Temp 97.6 F (36.4 C) (Oral)   Ht 5' 7 (1.702 m)   Wt 156 lb (70.8 kg)   SpO2 96%   BMI 24.43 kg/m       Objective:   Physical Exam Vitals and nursing note reviewed.  Constitutional:      Appearance: Normal appearance.  Cardiovascular:     Rate and Rhythm: Normal rate and regular rhythm.     Pulses: Normal pulses.     Heart sounds: Normal heart sounds.  Pulmonary:     Effort: Pulmonary effort is normal.     Breath sounds: Normal breath sounds.  Skin:    General: Skin is warm and dry.   Neurological:     General: No focal deficit present.     Mental Status: He is alert and oriented to person, place, and time.  Psychiatric:        Mood and Affect: Mood normal.        Behavior: Behavior normal.        Thought Content: Thought content normal.        Judgment: Judgment normal.       Assessment & Plan:  1. Confusion (Primary) - Reviewed hospital notes, discharge instructions, labs, imaging and medication changes if any with the patient and family. All questions answered to the best of my ability  - Improving. No confusion noted today in the office.  - Follow up with Neurology next month   2. Hypothyroidism, unspecified type - Will have them return in two weeks for repeat blood work  - TSH; Future - T4, Free; Future  3. Sinus bradycardia Pulse 60 today   4. Hypertension associated with diabetes (HCC) - At goal. Continue with lisinopril  10 mg daily   5. Hyperlipidemia associated with type 2 diabetes mellitus (HCC) - Continue with Lipitor 10 mg daily   6. Diabetes mellitus treated with oral medication (HCC) - A1c 6.0 in the hospital.  - Continue with metformin 500 mg daily     Meilin Brosh, NP

## 2024-08-21 ENCOUNTER — Other Ambulatory Visit

## 2024-08-21 ENCOUNTER — Ambulatory Visit: Payer: Self-pay | Admitting: Adult Health

## 2024-08-21 DIAGNOSIS — E039 Hypothyroidism, unspecified: Secondary | ICD-10-CM | POA: Diagnosis not present

## 2024-08-21 LAB — T4, FREE: Free T4: 0.69 ng/dL (ref 0.60–1.60)

## 2024-08-21 LAB — TSH: TSH: 3.3 u[IU]/mL (ref 0.35–5.50)

## 2024-08-29 ENCOUNTER — Ambulatory Visit (INDEPENDENT_AMBULATORY_CARE_PROVIDER_SITE_OTHER): Admitting: Adult Health

## 2024-08-29 ENCOUNTER — Encounter: Payer: Self-pay | Admitting: Adult Health

## 2024-08-29 ENCOUNTER — Ambulatory Visit (INDEPENDENT_AMBULATORY_CARE_PROVIDER_SITE_OTHER)

## 2024-08-29 VITALS — BP 122/60 | HR 53 | Temp 98.2°F | Ht 66.25 in | Wt 152.0 lb

## 2024-08-29 DIAGNOSIS — E1159 Type 2 diabetes mellitus with other circulatory complications: Secondary | ICD-10-CM | POA: Diagnosis not present

## 2024-08-29 DIAGNOSIS — Z0000 Encounter for general adult medical examination without abnormal findings: Secondary | ICD-10-CM

## 2024-08-29 DIAGNOSIS — E039 Hypothyroidism, unspecified: Secondary | ICD-10-CM

## 2024-08-29 DIAGNOSIS — M25552 Pain in left hip: Secondary | ICD-10-CM | POA: Diagnosis not present

## 2024-08-29 DIAGNOSIS — E785 Hyperlipidemia, unspecified: Secondary | ICD-10-CM | POA: Diagnosis not present

## 2024-08-29 DIAGNOSIS — Z7984 Long term (current) use of oral hypoglycemic drugs: Secondary | ICD-10-CM

## 2024-08-29 DIAGNOSIS — G8929 Other chronic pain: Secondary | ICD-10-CM | POA: Diagnosis not present

## 2024-08-29 DIAGNOSIS — I152 Hypertension secondary to endocrine disorders: Secondary | ICD-10-CM

## 2024-08-29 DIAGNOSIS — M150 Primary generalized (osteo)arthritis: Secondary | ICD-10-CM | POA: Diagnosis not present

## 2024-08-29 DIAGNOSIS — E119 Type 2 diabetes mellitus without complications: Secondary | ICD-10-CM

## 2024-08-29 DIAGNOSIS — N400 Enlarged prostate without lower urinary tract symptoms: Secondary | ICD-10-CM

## 2024-08-29 DIAGNOSIS — G3184 Mild cognitive impairment, so stated: Secondary | ICD-10-CM | POA: Diagnosis not present

## 2024-08-29 DIAGNOSIS — E1169 Type 2 diabetes mellitus with other specified complication: Secondary | ICD-10-CM

## 2024-08-29 LAB — COMPREHENSIVE METABOLIC PANEL WITH GFR
ALT: 17 U/L (ref 0–53)
AST: 20 U/L (ref 0–37)
Albumin: 4.4 g/dL (ref 3.5–5.2)
Alkaline Phosphatase: 59 U/L (ref 39–117)
BUN: 18 mg/dL (ref 6–23)
CO2: 26 meq/L (ref 19–32)
Calcium: 9.1 mg/dL (ref 8.4–10.5)
Chloride: 106 meq/L (ref 96–112)
Creatinine, Ser: 0.82 mg/dL (ref 0.40–1.50)
GFR: 79.94 mL/min (ref >60.00)
Glucose, Bld: 121 mg/dL — ABNORMAL HIGH (ref 70–99)
Potassium: 4.2 meq/L (ref 3.5–5.1)
Sodium: 140 meq/L (ref 135–145)
Total Bilirubin: 0.5 mg/dL (ref 0.2–1.2)
Total Protein: 6.8 g/dL (ref 6.0–8.3)

## 2024-08-29 LAB — CBC
HCT: 39.4 % (ref 39.0–52.0)
Hemoglobin: 13.5 g/dL (ref 13.0–17.0)
MCHC: 34.3 g/dL (ref 30.0–36.0)
MCV: 92.8 fl (ref 78.0–100.0)
Platelets: 191 K/uL (ref 150.0–400.0)
RBC: 4.24 Mil/uL (ref 4.22–5.81)
RDW: 13.9 % (ref 11.5–15.5)
WBC: 5.5 K/uL (ref 4.0–10.5)

## 2024-08-29 LAB — MICROALBUMIN / CREATININE URINE RATIO
Creatinine,U: 124.9 mg/dL
Microalb Creat Ratio: UNDETERMINED mg/g (ref 0.0–30.0)
Microalb, Ur: <0.7 mg/dL

## 2024-08-29 LAB — LIPID PANEL
Cholesterol: 131 mg/dL (ref 0–200)
HDL: 37.7 mg/dL — ABNORMAL LOW (ref >39.00)
LDL Cholesterol: 69 mg/dL (ref 0–99)
NonHDL: 93.69
Total CHOL/HDL Ratio: 3
Triglycerides: 124 mg/dL (ref 0.0–149.0)
VLDL: 24.8 mg/dL (ref 0.0–40.0)

## 2024-08-29 LAB — TSH: TSH: 2.78 u[IU]/mL (ref 0.35–5.50)

## 2024-08-29 LAB — HEMOGLOBIN A1C: Hgb A1c MFr Bld: 6.2 % (ref 4.6–6.5)

## 2024-08-29 LAB — PSA: PSA: 1.25 ng/mL (ref 0.10–4.00)

## 2024-08-29 MED ORDER — METFORMIN HCL 500 MG PO TABS: 500.0000 mg | ORAL_TABLET | Freq: Every day | ORAL | 1 refills | Status: AC

## 2024-08-29 MED ORDER — OMEPRAZOLE 20 MG PO CPDR: 20.0000 mg | DELAYED_RELEASE_CAPSULE | Freq: Every day | ORAL | 3 refills | Status: AC

## 2024-08-29 MED ORDER — TAMSULOSIN HCL 0.4 MG PO CAPS: 0.4000 mg | ORAL_CAPSULE | Freq: Every day | ORAL | 3 refills | Status: AC

## 2024-08-29 NOTE — Progress Notes (Signed)
 Subjective:    Patient ID: Todd Watson, male    DOB: July 07, 1941, 83 y.o.   MRN: [id number]  HPI  Patient presents for yearly preventative medicine examination. He is a pleasant 83 year old male who  has a past medical history of Arthritis, GERD (gastroesophageal reflux disease), Herpes zoster, Hiatal hernia, Hypothyroidism, and Osteoarthritis of lumbar spine.  He gets most of his care at the [facility].  He presents to the office today with his son   DM Type II - managed with metformin  500 mg daily at breakfast. He denies hypoglycemic events. He has thick callous on his fingers and has a hard time getting blood to check his blood sugar.  Lab Results  Component Value Date   HGBA1C 6.0 (H) 07/27/2024   HGBA1C 6.7 (H) 08/25/2023   HGBA1C 6.1 (A) 09/07/2022   HTN - managed with lisinopril  5 mg in the morning and 5 mg in the evening. He denies dizziness, lightheadedness, chest pain, or shortness of breath.  BP Readings from Last 3 Encounters:  08/29/24 122/60  08/07/24 120/60  07/27/24 (!) 145/65   Hyperlipidemia-prescribed Lipitor 10 mg at bedtime and aspirin  81 mg. Lab Results  Component Value Date   CHOL 141 07/27/2024   HDL 37 (L) 07/27/2024   LDLCALC 84 07/27/2024   TRIG 102 07/27/2024   CHOLHDL 3.8 07/27/2024   Hypothyroidism-managed with Synthroid  88 mcg daily. Lab Results  Component Value Date   TSH 3.30 08/21/2024   BPH-controlled on Flomax  0.4 mg daily  Osteoarthritis-mostly in his hips and back.  He does use anti-inflammatory medication as needed and this seems to work well for him. He reports the pain in his left hip has been becoming worse. He reports a burning pain when he lies on his left side . Denies pain with walking.   All immunizations and health maintenance protocols were reviewed with the patient and needed orders were placed. He is up to date on routine colon cancer screening.   Appropriate screening laboratory values were ordered for the patient including  screening of hyperlipidemia, renal function and hepatic function. If indicated by BPH, a PSA was ordered.  Medication reconciliation,  past medical history, social history, problem list and allergies were reviewed in detail with the patient  Goals were established with regard to weight loss, exercise, and  diet in compliance with medications. 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   HENT: Negative.    Eyes: Negative.   Respiratory: Negative.    Cardiovascular: Negative.   Gastrointestinal: Negative.   Endocrine: Negative.   Genitourinary: Negative.   Musculoskeletal:  Positive for arthralgias.  Skin: Negative.   Allergic/Immunologic: Negative.   Neurological: Negative.   Hematological: Negative.   Psychiatric/Behavioral: Negative.    All other systems reviewed and are negative.  Past Medical History:  Diagnosis Date   Arthritis    GERD (gastroesophageal reflux disease)    Herpes zoster    Hiatal hernia    Hypothyroidism    Osteoarthritis of lumbar spine     Social History   Socioeconomic History   Marital status: Married    Spouse name: Not on file   Number of children: 3   Years of education: 10   Highest education level: GED or equivalent  Occupational  History   Occupation: retired  Tobacco Use   Smoking status: Never   Smokeless tobacco: Never  Vaping Use   Vaping status: Never Used  Substance and Sexual Activity   Alcohol use: No   Drug use: No   Sexual activity: Yes  Other Topics Concern   Not on file  Social History Narrative   Lives with wife.    3 children     6 grandchildren & 5 great grandchildren   Likes to fish    Social Drivers of Health   Financial Resource Strain: Low Risk  (06/22/2024)   Overall Financial Resource Strain (CARDIA)    Difficulty of Paying  Living Expenses: Not hard at all  Food Insecurity: No Food Insecurity (07/27/2024)   Hunger Vital Sign    Worried About Running Out of Food in the Last Year: Never true    Ran Out of Food in the Last Year: Never true  Transportation Needs: Unmet Transportation Needs (07/30/2024)   PRAPARE - Transportation    Lack of Transportation (Medical): Yes    Lack of Transportation (Non-Medical): Yes  Physical Activity: Inactive (06/22/2024)   Exercise Vital Sign    Days of Exercise per Week: 0 days    Minutes of Exercise per Session: 0 min  Stress: No Stress Concern Present (06/22/2024)   [employer] of Occupational Health - Occupational Stress Questionnaire    Feeling of Stress: Not at all  Social Connections: Socially Integrated (07/27/2024)   Social Connection and Isolation Panel    Frequency of Communication with Friends and Family: More than three times a week    Frequency of Social Gatherings with Friends and Family: More than three times a week    Attends Religious Services: More than 4 times per year    Active Member of Clubs or Organizations: Not on file    Attends Banker Meetings: More than 4 times per year    Marital Status: Married  Catering Manager Violence: Not At Risk (07/27/2024)   Humiliation, Afraid, Rape, and Kick questionnaire    Fear of Current or Ex-Partner: No    Emotionally Abused: No    Physically Abused: No    Sexually Abused: No    Past Surgical History:  Procedure Laterality Date   APPENDECTOMY  2000   INGUINAL HERNIA REPAIR Bilateral    LUMBAR LAMINECTOMY/DECOMPRESSION MICRODISCECTOMY Right 01/28/2020   Procedure: Microdiscectomy - right - Lumbar four-Lumbar five;  Surgeon: Onetha Kuba, MD;  Location[facility];  Service: Neurosurgery;  Laterality: Right;   SPINE SURGERY     spurs    Family History  Problem Relation Age of Onset   Heart disease Father        Valve disease   Kidney failure Mother     No Known Allergies  Current  Outpatient Medications on File Prior to Visit  Medication Sig Dispense Refill   aspirin  EC 81 MG tablet Take 1 tablet (81 mg total) by mouth daily. Swallow whole.     atorvastatin  (LIPITOR) 10 MG tablet Take 10 mg by mouth daily.     blood glucose meter kit and supplies KIT Dispense based on patient and insurance preference. Use up to four times daily as directed. 1 each 0   Boswellia-Glucosamine-Vit D (OSTEO BI-FLEX ONE PER DAY PO) Take 1 tablet by mouth daily.     levothyroxine  (SYNTHROID ) 88 MCG tablet Take 88 mcg by mouth daily before breakfast.     lisinopril  (PRINIVIL ,ZESTRIL ) 10 MG tablet Take 10  mg by mouth in the morning and at bedtime.     Multiple Vitamin (MULTIVITAMIN) tablet Take 1 tablet by mouth daily.     Probiotic Product (PROBIOTIC DAILY PO) Take 1 capsule by mouth daily.     vitamin C (ASCORBIC ACID ) 500 MG tablet Take 500 mg by mouth daily.     No current facility-administered medications on file prior to visit.    BP 122/60   Pulse (!) 53   Temp 98.2 F (36.8 C) (Oral)   Ht 5' 6.25 (1.683 m)   Wt 152 lb (68.9 kg)   SpO2 97%   BMI 24.35 kg/m       Objective:   Physical Exam Vitals and nursing note reviewed.  Constitutional:      General: He is not in acute distress.    Appearance: Normal appearance. He is not ill-appearing.  HENT:     Head: Normocephalic and atraumatic.     Right Ear: Tympanic membrane, ear canal and external ear normal. There is no impacted cerumen.     Left Ear: Tympanic membrane, ear canal and external ear normal. There is no impacted cerumen.     Nose: Nose normal. No congestion or rhinorrhea.     Mouth/Throat:     Mouth: Mucous membranes are moist.     Pharynx: Oropharynx is clear.  Eyes:     Extraocular Movements: Extraocular movements intact.     Conjunctiva/sclera: Conjunctivae normal.     Pupils: Pupils are equal, round, and reactive to light.  Neck:     Vascular: No carotid bruit.  Cardiovascular:     Rate and Rhythm:  Normal rate and regular rhythm.     Pulses: Normal pulses.     Heart sounds: No murmur heard.    No friction rub. No gallop.  Pulmonary:     Effort: Pulmonary effort is normal.     Breath sounds: Normal breath sounds.  Abdominal:     General: Abdomen is flat. Bowel sounds are normal. There is no distension.     Palpations: Abdomen is soft. There is no mass.     Tenderness: There is no abdominal tenderness. There is no guarding or rebound.     Hernia: No hernia is present.  Musculoskeletal:        General: Normal range of motion.     Cervical back: Normal range of motion and neck supple.     Left hip: Bony tenderness present. No deformity. Normal range of motion. Normal strength.  Lymphadenopathy:     Cervical: No cervical adenopathy.  Skin:    General: Skin is warm and dry.     Capillary Refill: Capillary refill takes less than 2 seconds.  Neurological:     General: No focal deficit present.     Mental Status: He is alert and oriented to person, place, and time.  Psychiatric:        Attention and Perception: Attention and perception normal.        Mood and Affect: Mood normal.        Behavior: Behavior normal.        Thought Content: Thought content normal.        Cognition and Memory: Memory is impaired.        Judgment: Judgment normal.     Comments: Very mild cognitive impairment noted today in regards to dates during stories         Assessment & Plan:  1. Routine general medical examination at a health  care facility (Primary) Today patient counseled on age appropriate routine health concerns for screening and prevention, each reviewed and up to date or declined. Immunizations reviewed and up to date or declined. Labs ordered and reviewed. Risk factors for depression reviewed and negative. Hearing function and visual acuity are intact. ADLs screened and addressed as needed. Functional ability and level of safety reviewed and appropriate. Education, counseling and referrals  performed based on assessed risks today. Patient provided with a copy of personalized plan for preventive services. - Follow up in one year or sooner if needed - eat healthy and stay active   2. Diabetes mellitus treated with oral medication (HCC) - Consider increase in Metformin   - Likely 3 month follow up  - Lipid panel; Future - TSH; Future - CBC; Future - Comprehensive metabolic panel with GFR; Future - Hemoglobin A1c; Future - Microalbumin/Creatinine Ratio, Urine; Future - metFORMIN  (GLUCOPHAGE ) 500 MG tablet; Take 1 tablet (500 mg total) by mouth daily with breakfast.  Dispense: 90 tablet; Refill: 1  3. Hypertension associated with diabetes (HCC) - Well controlled. No change in medication  - Lipid panel; Future - TSH; Future - CBC; Future - Comprehensive metabolic panel with GFR; Future  4. Hyperlipidemia associated with type 2 diabetes mellitus (HCC) - Consider increase in statin  - Lipid panel; Future - TSH; Future - CBC; Future - Comprehensive metabolic panel with GFR; Future  5. Hypothyroidism, unspecified type - Consider dose change of synthroid   - Lipid panel; Future - TSH; Future - CBC; Future - Comprehensive metabolic panel with GFR; Future  6. Benign prostatic hyperplasia without lower urinary tract symptoms - Continue with Flomax   - PSA; Future - tamsulosin  (FLOMAX ) 0.4 MG CAPS capsule; Take 1 capsule (0.4 mg total) by mouth at bedtime.  Dispense: 90 capsule; Refill: 3  7. Primary osteoarthritis involving multiple joints - Can continue NSAIDS as needed - Lipid panel; Future - TSH; Future - CBC; Future - Comprehensive metabolic panel with GFR; Future  8. Chronic left hip pain - Osteoarthritis vs bursitis  - DG Hip Unilat W OR W/O Pelvis 2-3 Views Left; Future  9. Mild cognitive impairment - Continue to monitor. He does have an appointment with neurology next months   Darleene Shape, NP

## 2024-09-03 DIAGNOSIS — Z7984 Long term (current) use of oral hypoglycemic drugs: Secondary | ICD-10-CM | POA: Diagnosis not present

## 2024-09-03 DIAGNOSIS — M1990 Unspecified osteoarthritis, unspecified site: Secondary | ICD-10-CM | POA: Diagnosis not present

## 2024-09-03 DIAGNOSIS — Z59869 Financial insecurity, unspecified: Secondary | ICD-10-CM | POA: Diagnosis not present

## 2024-09-03 DIAGNOSIS — K219 Gastro-esophageal reflux disease without esophagitis: Secondary | ICD-10-CM | POA: Diagnosis not present

## 2024-09-03 DIAGNOSIS — N400 Enlarged prostate without lower urinary tract symptoms: Secondary | ICD-10-CM | POA: Diagnosis not present

## 2024-09-03 DIAGNOSIS — Z5941 Food insecurity: Secondary | ICD-10-CM | POA: Diagnosis not present

## 2024-09-03 DIAGNOSIS — Z5982 Transportation insecurity: Secondary | ICD-10-CM | POA: Diagnosis not present

## 2024-09-03 DIAGNOSIS — E1142 Type 2 diabetes mellitus with diabetic polyneuropathy: Secondary | ICD-10-CM | POA: Diagnosis not present

## 2024-09-03 DIAGNOSIS — Z5948 Other specified lack of adequate food: Secondary | ICD-10-CM | POA: Diagnosis not present

## 2024-09-04 ENCOUNTER — Telehealth: Payer: Self-pay

## 2024-09-04 ENCOUNTER — Ambulatory Visit: Payer: Self-pay | Admitting: Adult Health

## 2024-09-04 ENCOUNTER — Telehealth: Payer: Self-pay | Admitting: *Deleted

## 2024-09-04 NOTE — Telephone Encounter (Signed)
 Copied from CRM #[id number]. Topic: Clinical - Lab/Test Results >> Sep 03, 2024  1:50 PM Darshell M wrote: Reason for CRM: patient calling for left hip xray results. Wants to know what to do to treat burning in left hip. Painful when he lays on it. Which medication should he take? What is the course of treatment? CB# [phone]. Does not use mychart.   Duplicate

## 2024-09-04 NOTE — Telephone Encounter (Signed)
 Copied from CRM #[id number]. Topic: General - Other >> Sep 04, 2024 10:07 AM Aleatha C wrote: Reason for CRM: Patient would like a call regarding his xray results and his is still experiencing pain

## 2024-09-05 NOTE — Telephone Encounter (Unsigned)
 Copied from CRM [id number]. Topic: Clinical - Lab/Test Results >> Sep 05, 2024  9:25 AM Wess RAMAN wrote: Reason for CRM: Patient missed call from Vicci Marjorie SAUNDERS, CMA regarding his xray results and would like a call back.  Callback #: [phone]

## 2024-09-05 NOTE — Telephone Encounter (Signed)
 Called pt no answer vm full. Will try again later.

## 2024-09-05 NOTE — Telephone Encounter (Signed)
 Called pt to advised that images are not resulted yet but we will call when they are in. Pt verbalized understanding. Pt stated that he is experiencing pain when laying down.

## 2024-09-11 ENCOUNTER — Ambulatory Visit: Admitting: Adult Health

## 2024-09-12 ENCOUNTER — Ambulatory Visit (INDEPENDENT_AMBULATORY_CARE_PROVIDER_SITE_OTHER): Admitting: Adult Health

## 2024-09-12 ENCOUNTER — Encounter: Payer: Self-pay | Admitting: Adult Health

## 2024-09-12 VITALS — BP 100/70 | HR 64 | Temp 97.7°F | Ht 66.5 in | Wt 153.0 lb

## 2024-09-12 DIAGNOSIS — M1612 Unilateral primary osteoarthritis, left hip: Secondary | ICD-10-CM

## 2024-09-12 DIAGNOSIS — L821 Other seborrheic keratosis: Secondary | ICD-10-CM | POA: Diagnosis not present

## 2024-09-12 NOTE — Progress Notes (Signed)
 Subjective:    Patient ID: Todd Watson, male    DOB: 1940-10-14, 83 y.o.   MRN: [id number]  HPI  Discussed the use of AI scribe software for clinical note transcription with the patient, who gave verbal consent to proceed.  History of Present Illness   Todd Watson is an 83 year old male who presents with burning hip pain and skin lesions.  He has intermittent burning pain in his hip, worse when lying on it or with certain movements. Soaking in hot water and sleeping on the couch instead of his bed relieve the pain. The last episode was a few days ago and he has no burning at the time of visit. He did have an xray of the left hip about two weeks ago for chronic pain which showed progressive arthritic changes. he has no issues with ambulation and has not had any further trauma.    He has several burning, itchy skin lesions on his back that he describes as moles. He is concerned about them. His son has a similar lesion.       Review of Systems See HPI   Past Medical History:  Diagnosis Date   Arthritis    GERD (gastroesophageal reflux disease)    Herpes zoster    Hiatal hernia    Hypothyroidism    Osteoarthritis of lumbar spine     Social History   Socioeconomic History   Marital status: Married    Spouse name: Not on file   Number of children: 3   Years of education: 10   Highest education level: GED or equivalent  Occupational History   Occupation: retired  Tobacco Use   Smoking status: Never   Smokeless tobacco: Never  Vaping Use   Vaping status: Never Used  Substance and Sexual Activity   Alcohol use: No   Drug use: No   Sexual activity: Yes  Other Topics Concern   Not on file  Social History Narrative   Lives with wife.    3 children     6 grandchildren & 5 great grandchildren   Likes to fish    Social Drivers of Health   Financial Resource Strain: Low Risk  (06/22/2024)   Overall Financial Resource Strain (CARDIA)    Difficulty of Paying Living  Expenses: Not hard at all  Food Insecurity: No Food Insecurity (07/27/2024)   Hunger Vital Sign    Worried About Running Out of Food in the Last Year: Never true    Ran Out of Food in the Last Year: Never true  Transportation Needs: Unmet Transportation Needs (07/30/2024)   PRAPARE - Administrator, Civil Service (Medical): Yes    Lack of Transportation (Non-Medical): Yes  Physical Activity: Inactive (06/22/2024)   Exercise Vital Sign    Days of Exercise per Week: 0 days    Minutes of Exercise per Session: 0 min  Stress: No Stress Concern Present (06/22/2024)   [employer] of Occupational Health - Occupational Stress Questionnaire    Feeling of Stress: Not at all  Social Connections: Socially Integrated (07/27/2024)   Social Connection and Isolation Panel    Frequency of Communication with Friends and Family: More than three times a week    Frequency of Social Gatherings with Friends and Family: More than three times a week    Attends Religious Services: More than 4 times per year    Active Member of [employer] or Organizations: Not on file    Attends  Club or Organization Meetings: More than 4 times per year    Marital Status: Married  Catering Manager Violence: Not At Risk (07/27/2024)   Humiliation, Afraid, Rape, and Kick questionnaire    Fear of Current or Ex-Partner: No    Emotionally Abused: No    Physically Abused: No    Sexually Abused: No    Past Surgical History:  Procedure Laterality Date   APPENDECTOMY  2000   INGUINAL HERNIA REPAIR Bilateral    LUMBAR LAMINECTOMY/DECOMPRESSION MICRODISCECTOMY Right 01/28/2020   Procedure: Microdiscectomy - right - Lumbar four-Lumbar five;  Surgeon: Onetha Kuba, MD;  Location: [facility] OR;  Service: Neurosurgery;  Laterality: Right;   SPINE SURGERY     spurs    Family History  Problem Relation Age of Onset   Heart disease Father        Valve disease   Kidney failure Mother     No Known Allergies  Current Outpatient  Medications on File Prior to Visit  Medication Sig Dispense Refill   aspirin  EC 81 MG tablet Take 1 tablet (81 mg total) by mouth daily. Swallow whole.     atorvastatin  (LIPITOR) 10 MG tablet Take 10 mg by mouth daily.     blood glucose meter kit and supplies KIT Dispense based on patient and insurance preference. Use up to four times daily as directed. 1 each 0   Boswellia-Glucosamine-Vit D (OSTEO BI-FLEX ONE PER DAY PO) Take 1 tablet by mouth daily.     levothyroxine  (SYNTHROID ) 88 MCG tablet Take 88 mcg by mouth daily before breakfast.     lisinopril  (PRINIVIL ,ZESTRIL ) 10 MG tablet Take 10 mg by mouth in the morning and at bedtime.     metFORMIN  (GLUCOPHAGE ) 500 MG tablet Take 1 tablet (500 mg total) by mouth daily with breakfast. 90 tablet 1   Multiple Vitamin (MULTIVITAMIN) tablet Take 1 tablet by mouth daily.     omeprazole  (PRILOSEC) 20 MG capsule Take 1 capsule (20 mg total) by mouth daily. May take 1 additional capsule in the evening as needed for heartburn 90 capsule 3   Probiotic Product (PROBIOTIC DAILY PO) Take 1 capsule by mouth daily.     tamsulosin  (FLOMAX ) 0.4 MG CAPS capsule Take 1 capsule (0.4 mg total) by mouth at bedtime. 90 capsule 3   vitamin C (ASCORBIC ACID ) 500 MG tablet Take 500 mg by mouth daily.     No current facility-administered medications on file prior to visit.    BP 100/70   Pulse 64   Temp 97.7 F (36.5 C) (Oral)   Ht 5' 6.5 (1.689 m)   Wt 153 lb (69.4 kg)   SpO2 96%   BMI 24.32 kg/m       Objective:   Physical Exam Vitals and nursing note reviewed.  Constitutional:      Appearance: Normal appearance.  Musculoskeletal:        General: No swelling, tenderness, deformity or signs of injury. Normal range of motion.     Left hip: No deformity, tenderness, bony tenderness or crepitus. Normal range of motion. Normal strength.     Right lower leg: No edema.     Left lower leg: No edema.  Skin:    Capillary Refill: Capillary refill takes less  than 2 seconds.      Neurological:     General: No focal deficit present.     Mental Status: He is alert and oriented to person, place, and time.        Assessment &  Plan:  Assessment and Plan    Left hip osteoarthritis Mild arthritis confirmed by x-ray. Symptoms improving, no fracture noted. - Continue current management and monitor symptoms. - Consider orthopedic referral if symptoms worsen but he would like to hold off on now and see how it improves.  Seborrheic keratosis - Verbal consent obtained and using cryo therapy five seborrheic keratosis were burned using two freeze thaw cycles. Patient tolerated procedure well. After care instructions reviewed      Darleene Shape, NP

## 2024-09-24 ENCOUNTER — Encounter: Payer: Self-pay | Admitting: Neurology

## 2024-09-24 ENCOUNTER — Ambulatory Visit: Admitting: Neurology

## 2024-09-24 VITALS — BP 132/70 | HR 80 | Ht 67.0 in | Wt 156.0 lb

## 2024-09-24 DIAGNOSIS — E039 Hypothyroidism, unspecified: Secondary | ICD-10-CM

## 2024-09-24 DIAGNOSIS — R4781 Slurred speech: Secondary | ICD-10-CM

## 2024-09-24 DIAGNOSIS — R531 Weakness: Secondary | ICD-10-CM | POA: Diagnosis not present

## 2024-09-24 NOTE — Patient Instructions (Addendum)
 Continue same medications Going to follow with PCP He may resume driving Return as needed

## 2024-09-24 NOTE — Progress Notes (Signed)
 "   [facility]  PATIENT: Todd Watson DOB: [birth year]  REQUESTING CLINICIAN: Merna Huxley, NP HISTORY FROM: Patient/Son and chart review  REASON FOR VISIT: Episode of weakness and aphasia    HISTORICAL  CHIEF COMPLAINT:  Chief Complaint  Patient presents with   New Patient (Initial Visit)    Room 13 With son ED referral for TIA    HISTORY OF PRESENT ILLNESS:  Discussed the use of AI scribe software for clinical note transcription with the patient, who gave verbal consent to proceed.  Todd Watson is an 83 year old male with history of hypothyroidism, hypertension, diabetes, hyperlipidemia who presents for follow-up after an event of weakness, aphasia likely related to medication non adherence.  In October, he experienced an episode of leaning to the side and being unusually quiet, which was atypical for him, while pushing his wife's wheelchair. He was also experiencing word finding difficulty, aphasia.  He was taken to the hospital where a full workup, including CT scan, MRI, and EEG were performed, all of which returned normal results. His thyroid  hormone levels were elevated at 24 at that time but have since returned to normal after resuming his medication.  He has been compliant with his medication since the hospital visit, with his wife assisting in organizing his pills in a pill box to ensure proper intake.  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 He is active in his church community and has a supportive family network, including his son who helps with transportation. His wife is currently unwell and receiving therapy and nursing care at home.    OTHER MEDICAL CONDITIONS: Hypothyroidism, hypertension, hyperlipidemia, diabetes   REVIEW OF SYSTEMS: Full 14 system review of systems performed and negative with exception of: As noted in  the HPI  ALLERGIES: Allergies[1]  HOME MEDICATIONS: Outpatient Medications Prior to Visit  Medication Sig Dispense Refill   aspirin  EC 81 MG tablet Take 1 tablet (81 mg total) by mouth daily. Swallow whole.     atorvastatin  (LIPITOR) 10 MG tablet Take 10 mg by mouth daily.     blood glucose meter kit and supplies KIT Dispense based on patient and insurance preference. Use up to four times daily as directed. 1 each 0   Boswellia-Glucosamine-Vit D (OSTEO BI-FLEX ONE PER DAY PO) Take 1 tablet by mouth daily.     levothyroxine  (SYNTHROID ) 88 MCG tablet Take 88 mcg by mouth daily before breakfast.     lisinopril  (PRINIVIL ,ZESTRIL ) 10 MG tablet Take 10 mg by mouth in the morning and at bedtime.     metFORMIN  (GLUCOPHAGE ) 500 MG tablet Take 1 tablet (500 mg total) by mouth daily with breakfast. 90 tablet 1   Multiple Vitamin (MULTIVITAMIN) tablet Take 1 tablet by mouth daily.     omeprazole  (PRILOSEC) 20 MG capsule Take 1 capsule (20 mg total) by mouth daily. May take 1 additional capsule in the evening as needed for heartburn 90 capsule 3   Probiotic Product (PROBIOTIC DAILY PO) Take 1 capsule by mouth daily.     tamsulosin  (FLOMAX ) 0.4 MG CAPS capsule Take 1 capsule (0.4 mg total) by mouth at bedtime. 90 capsule 3   vitamin C (ASCORBIC ACID ) 500 MG tablet Take 500 mg by mouth daily.     No facility-administered medications prior to visit.    PAST MEDICAL HISTORY: Past Medical  History:  Diagnosis Date   Arthritis    GERD (gastroesophageal reflux disease)    Herpes zoster    Hiatal hernia    Hypothyroidism    Osteoarthritis of lumbar spine     PAST SURGICAL HISTORY: Past Surgical History:  Procedure Laterality Date   APPENDECTOMY  2000   INGUINAL HERNIA REPAIR Bilateral    LUMBAR LAMINECTOMY/DECOMPRESSION MICRODISCECTOMY Right 01/28/2020   Procedure: Microdiscectomy - right - Lumbar four-Lumbar five;  Surgeon: Onetha Kuba, MD;  Location: [facility] OR;  Service: Neurosurgery;  Laterality:  Right;   SPINE SURGERY     spurs    FAMILY HISTORY: Family History  Problem Relation Age of Onset   Heart disease Father        Valve disease   Kidney failure Mother     SOCIAL HISTORY: Social History   Socioeconomic History   Marital status: Married    Spouse name: Not on file   Number of children: 3   Years of education: 10   Highest education level: GED or equivalent  Occupational History   Occupation: retired  Tobacco Use   Smoking status: Never   Smokeless tobacco: Never  Vaping Use   Vaping status: Never Used  Substance and Sexual Activity   Alcohol use: No   Drug use: No   Sexual activity: Yes  Other Topics Concern   Not on file  Social History Narrative   Lives with wife.    3 children     6 grandchildren & 5 great grandchildren   Likes to fish    Social Drivers of Health   Tobacco Use: Low Risk (09/24/2024)   Patient History    Smoking Tobacco Use: Never    Smokeless Tobacco Use: Never    Passive Exposure: Not on file  Financial Resource Strain: Low Risk (06/22/2024)   Overall Financial Resource Strain (CARDIA)    Difficulty of Paying Living Expenses: Not hard at all  Food Insecurity: No Food Insecurity (07/27/2024)   Epic    Worried About Programme Researcher, Broadcasting/film/video in the Last Year: Never true    Ran Out of Food in the Last Year: Never true  Transportation Needs: Unmet Transportation Needs (07/30/2024)   Epic    Lack of Transportation (Medical): Yes    Lack of Transportation (Non-Medical): Yes  Physical Activity: Inactive (06/22/2024)   Exercise Vital Sign    Days of Exercise per Week: 0 days    Minutes of Exercise per Session: 0 min  Stress: No Stress Concern Present (06/22/2024)   [employer] of Occupational Health - Occupational Stress Questionnaire    Feeling of Stress: Not at all  Social Connections: Socially Integrated (07/27/2024)   Social Connection and Isolation Panel    Frequency of Communication with Friends and Family: More than  three times a week    Frequency of Social Gatherings with Friends and Family: More than three times a week    Attends Religious Services: More than 4 times per year    Active Member of Clubs or Organizations: Not on file    Attends Banker Meetings: More than 4 times per year    Marital Status: Married  Catering Manager Violence: Not At Risk (07/27/2024)   Epic    Fear of Current or Ex-Partner: No    Emotionally Abused: No    Physically Abused: No    Sexually Abused: No  Depression (PHQ2-9): Low Risk (06/22/2024)   Depression (PHQ2-9)    PHQ-2 Score: 0  Alcohol Screen: Low Risk (06/22/2024)   Alcohol Screen    Last Alcohol Screening Score (AUDIT): 0  Housing: Unknown (07/27/2024)   Epic    Unable to Pay for Housing in the Last Year: No    Number of Times Moved in the Last Year: Not on file    Homeless in the Last Year: No  Utilities: Not At Risk (07/27/2024)   Epic    Threatened with loss of utilities: No  Health Literacy: Adequate Health Literacy (06/22/2024)   B1300 Health Literacy    Frequency of need for help with medical instructions: Never    PHYSICAL EXAM  GENERAL EXAM/CONSTITUTIONAL: Vitals:  Vitals:   09/24/24 0842  BP: 132/70  Pulse: 80  SpO2: 98%  Weight: 156 lb (70.8 kg)  Height: 5' 7 (1.702 m)   Body mass index is 24.43 kg/m. Wt Readings from Last 3 Encounters:  09/24/24 156 lb (70.8 kg)  09/12/24 153 lb (69.4 kg)  08/29/24 152 lb (68.9 kg)   Patient is in no distress; well developed, nourished and groomed; neck is supple  MUSCULOSKELETAL: Gait, strength, tone, movements noted in Neurologic exam below  NEUROLOGIC: MENTAL STATUS:     09/04/2018    8:53 AM  MMSE - Mini Mental State Exam  Not completed: --   awake, alert, oriented to person, place and time recent and remote memory intact normal attention and concentration language fluent, comprehension intact, naming intact fund of knowledge appropriate  CRANIAL NERVE:  2nd,  3rd, 4th, 6th - Visual fields full to confrontation, extraocular muscles intact, no nystagmus 5th - facial sensation symmetric 7th - facial strength symmetric 8th - hearing intact 9th - palate elevates symmetrically, uvula midline 11th - shoulder shrug symmetric 12th - tongue protrusion midline  MOTOR:  normal bulk and tone, full strength in the BUE, BLE  SENSORY:  normal and symmetric to light touch  COORDINATION:  finger-nose-finger, fine finger movements normal  GAIT/STATION:  normal  \  DIAGNOSTIC DATA (LABS, IMAGING, TESTING) - I reviewed patient records, labs, notes, testing and imaging myself where available.  Lab Results  Component Value Date   WBC 5.5 08/29/2024   HGB 13.5 08/29/2024   HCT 39.4 08/29/2024   MCV 92.8 08/29/2024   PLT 191.0 08/29/2024      Component Value Date/Time   NA 140 08/29/2024 1025   K 4.2 08/29/2024 1025   CL 106 08/29/2024 1025   CO2 26 08/29/2024 1025   GLUCOSE 121 (H) 08/29/2024 1025   BUN 18 08/29/2024 1025   CREATININE 0.82 08/29/2024 1025   CALCIUM  9.1 08/29/2024 1025   PROT 6.8 08/29/2024 1025   ALBUMIN 4.4 08/29/2024 1025   AST 20 08/29/2024 1025   ALT 17 08/29/2024 1025   ALKPHOS 59 08/29/2024 1025   BILITOT 0.5 08/29/2024 1025   GFRNONAA >60 07/27/2024 0555   GFRAA >60 01/28/2020 1223   Lab Results  Component Value Date   CHOL 131 08/29/2024   HDL 37.70 (L) 08/29/2024   LDLCALC 69 08/29/2024   TRIG 124.0 08/29/2024   CHOLHDL 3 08/29/2024   Lab Results  Component Value Date   HGBA1C 6.2 08/29/2024   Lab Results  Component Value Date   VITAMINB12 305 07/27/2024   Lab Results  Component Value Date   TSH 2.78 08/29/2024    CT Head 07/26/2024 1. No acute intracranial process.   CT angio head and neck 07/26/2024 1. No large vessel occlusion or hemodynamically significant stenosis   MRI Brain 07/26/2024  1. No acute intracranial abnormality   Routine EEG 07/27/2024 Normal      ASSESSMENT AND  PLAN  83 y.o. year old male with hypertension, hyperlipidemia, hypothyroidism, diabetes who is presenting after an episode of aphasia and weakness likely related to symptoms of hypothyroidism.  He was not taking his thyroid  medication and during that admission his TSH was elevated at 24.  Since then, it has returned to normal with his son helping with his medication daily.   Hypothyroidism Previous non-compliance to medication leading to elevated thyroid  hormone levels. Levels normalized after resuming medication. - Continue thyroid  medication as prescribed. - Ensure adherence to medication regimen using a pill box.   1. Weakness   2. Slurred speech   3. Hypothyroidism, unspecified type      Patient Instructions  Continue same medications Going to follow with PCP He may resume driving Return as needed  No orders of the defined types were placed in this encounter.   No orders of the defined types were placed in this encounter.   Return if symptoms worsen or fail to improve.    Pastor Falling, MD 09/24/2024, 9:39 AM  [facility] [address], [address] [place], [place] [address] [phone]     [1] No Known Allergies  "

## 2024-10-06 ENCOUNTER — Other Ambulatory Visit: Payer: Self-pay

## 2024-10-06 ENCOUNTER — Emergency Department (HOSPITAL_COMMUNITY)

## 2024-10-06 ENCOUNTER — Encounter (HOSPITAL_COMMUNITY): Payer: Self-pay

## 2024-10-06 ENCOUNTER — Emergency Department (HOSPITAL_COMMUNITY)
Admission: EM | Admit: 2024-10-06 | Discharge: 2024-10-06 | Disposition: A | Discharge status: Home / Self Care | Source: Home / Self Care | Attending: Emergency Medicine | Admitting: Emergency Medicine

## 2024-10-06 DIAGNOSIS — Z7982 Long term (current) use of aspirin: Secondary | ICD-10-CM | POA: Insufficient documentation

## 2024-10-06 DIAGNOSIS — Z79899 Other long term (current) drug therapy: Secondary | ICD-10-CM | POA: Insufficient documentation

## 2024-10-06 DIAGNOSIS — Z23 Encounter for immunization: Secondary | ICD-10-CM | POA: Insufficient documentation

## 2024-10-06 DIAGNOSIS — I10 Essential (primary) hypertension: Secondary | ICD-10-CM | POA: Insufficient documentation

## 2024-10-06 DIAGNOSIS — S62667A Nondisplaced fracture of distal phalanx of left little finger, initial encounter for closed fracture: Secondary | ICD-10-CM | POA: Insufficient documentation

## 2024-10-06 DIAGNOSIS — E119 Type 2 diabetes mellitus without complications: Secondary | ICD-10-CM | POA: Insufficient documentation

## 2024-10-06 DIAGNOSIS — S62667B Nondisplaced fracture of distal phalanx of left little finger, initial encounter for open fracture: Secondary | ICD-10-CM

## 2024-10-06 DIAGNOSIS — Z7984 Long term (current) use of oral hypoglycemic drugs: Secondary | ICD-10-CM | POA: Insufficient documentation

## 2024-10-06 DIAGNOSIS — W231XXA Caught, crushed, jammed, or pinched between stationary objects, initial encounter: Secondary | ICD-10-CM | POA: Insufficient documentation

## 2024-10-06 LAB — CBC WITH DIFFERENTIAL/PLATELET
Abs Immature Granulocytes: 0.02 K/uL (ref 0.00–0.07)
Basophils Absolute: 0 K/uL (ref 0.0–0.1)
Basophils Relative: 1 %
Eosinophils Absolute: 0.1 K/uL (ref 0.0–0.5)
Eosinophils Relative: 2 %
HCT: 38.7 % — ABNORMAL LOW (ref 39.0–52.0)
Hemoglobin: 13.2 g/dL (ref 13.0–17.0)
Immature Granulocytes: 0 %
Lymphocytes Relative: 19 %
Lymphs Abs: 1.2 K/uL (ref 0.7–4.0)
MCH: 31.7 pg (ref 26.0–34.0)
MCHC: 34.1 g/dL (ref 30.0–36.0)
MCV: 93 fL (ref 80.0–100.0)
Monocytes Absolute: 0.6 K/uL (ref 0.1–1.0)
Monocytes Relative: 10 %
Neutro Abs: 4.1 K/uL (ref 1.7–7.7)
Neutrophils Relative %: 68 %
Platelets: 168 K/uL (ref 150–400)
RBC: 4.16 MIL/uL — ABNORMAL LOW (ref 4.22–5.81)
RDW: 13.5 % (ref 11.5–15.5)
WBC: 6 K/uL (ref 4.0–10.5)
nRBC: 0 % (ref 0.0–0.2)

## 2024-10-06 LAB — BASIC METABOLIC PANEL WITH GFR
Anion gap: 12 (ref 5–15)
BUN: 19 mg/dL (ref 8–23)
CO2: 24 mmol/L (ref 22–32)
Calcium: 8.8 mg/dL — ABNORMAL LOW (ref 8.9–10.3)
Chloride: 104 mmol/L (ref 98–111)
Creatinine, Ser: 0.85 mg/dL (ref 0.61–1.24)
GFR, Estimated: >60 mL/min
Glucose, Bld: 101 mg/dL — ABNORMAL HIGH (ref 70–99)
Potassium: 4.1 mmol/L (ref 3.5–5.1)
Sodium: 140 mmol/L (ref 135–145)

## 2024-10-06 MED ORDER — TETANUS-DIPHTH-ACELL PERTUSSIS 5-2-15.5 LF-MCG/0.5 IM SUSP
0.5000 mL | Freq: Once | INTRAMUSCULAR | Status: AC
  Administered 2024-10-06: 0.5 mL via INTRAMUSCULAR
  Filled 2024-10-06: qty 0.5

## 2024-10-06 MED ORDER — CEFAZOLIN SODIUM-DEXTROSE 2-4 GM/100ML-% IV SOLN
2.0000 g | Freq: Once | INTRAVENOUS | Status: AC
  Administered 2024-10-06: 2 g via INTRAVENOUS
  Filled 2024-10-06: qty 100

## 2024-10-06 MED ORDER — CEPHALEXIN 500 MG PO CAPS: 500.0000 mg | ORAL_CAPSULE | Freq: Four times a day (QID) | ORAL | 0 refills | Status: AC

## 2024-10-06 MED ORDER — LIDOCAINE HCL (PF) 1 % IJ SOLN
5.0000 mL | Freq: Once | INTRAMUSCULAR | Status: AC
  Administered 2024-10-06: 5 mL via INTRADERMAL
  Filled 2024-10-06: qty 5

## 2024-10-06 NOTE — ED Triage Notes (Signed)
 Patient bib [facility] from home after a finger injury. He was helping to move a wood stove and his left pink finger was crushed between the wood stove and the door. EMS reports a partial amputation/avulsion injury to the pinky. Bleeding is controlled on arrival and patient is A&Ox4.

## 2024-10-06 NOTE — ED Provider Notes (Signed)
 " [place] [facility] Provider Note   CSN: [id number] Arrival date & time: 10/06/24  1535     Patient presents with: Finger Injury   Todd Watson is a 84 y.o. male.   HPI Patient presents for finger injury.  Medical history includes arthritis, GERD, DM, HTN.  He is prescribed aspirin  but no other blood thinners.  Earlier today, he was in his normal state of health.  He was helping move a wood stove from outside into a basement.  As he was doing so, he got his left fifth digit caught between the wood stove and the door.  This does cause a traumatic injury to his finger.  He denies any other areas of pain or suspected injury.    Prior to Admission medications  Medication Sig Start Date End Date Taking? Authorizing Provider  cephALEXin  (KEFLEX ) 500 MG capsule Take 1 capsule (500 mg total) by mouth 4 (four) times daily. 10/06/24  Yes Melvenia Motto, MD  aspirin  EC 81 MG tablet Take 1 tablet (81 mg total) by mouth daily. Swallow whole. 07/28/24   Patsy Lenis, MD  atorvastatin  (LIPITOR) 10 MG tablet Take 10 mg by mouth daily.    [provider]  blood glucose meter kit and supplies KIT Dispense based on patient and insurance preference. Use up to four times daily as directed. 09/02/22   Nafziger, Cory, NP  Boswellia-Glucosamine-Vit D (OSTEO BI-FLEX ONE PER DAY PO) Take 1 tablet by mouth daily.    [provider]  levothyroxine  (SYNTHROID ) 88 MCG tablet Take 88 mcg by mouth daily before breakfast.    [provider]  lisinopril  (PRINIVIL ,ZESTRIL ) 10 MG tablet Take 10 mg by mouth in the morning and at bedtime.    [provider]  metFORMIN  (GLUCOPHAGE ) 500 MG tablet Take 1 tablet (500 mg total) by mouth daily with breakfast. 08/29/24 11/27/24  Nafziger, Cory, NP  Multiple Vitamin (MULTIVITAMIN) tablet Take 1 tablet by mouth daily.    [provider]  omeprazole  (PRILOSEC) 20 MG capsule Take 1 capsule (20 mg total) by  mouth daily. May take 1 additional capsule in the evening as needed for heartburn 08/29/24   Nafziger, Darleene, NP  Probiotic Product (PROBIOTIC DAILY PO) Take 1 capsule by mouth daily.    [provider]  tamsulosin  (FLOMAX ) 0.4 MG CAPS capsule Take 1 capsule (0.4 mg total) by mouth at bedtime. 08/29/24   Nafziger, Darleene, NP  vitamin C (ASCORBIC ACID ) 500 MG tablet Take 500 mg by mouth daily.    [provider]    Allergies: Patient has no known allergies.    Review of Systems  Skin:  Positive for wound.  All other systems reviewed and are negative.   Updated Vital Signs BP (!) 173/81   Pulse (!) 56   Temp 97.8 F (36.6 C)   Resp 18   Ht 5' 7 (1.702 m)   Wt 70.3 kg   SpO2 98%   BMI 24.28 kg/m   Physical Exam Vitals and nursing note reviewed.  Constitutional:      General: He is not in acute distress.    Appearance: Normal appearance. He is well-developed. He is not ill-appearing, toxic-appearing or diaphoretic.  HENT:     Head: Normocephalic and atraumatic.     Right Ear: External ear normal.     Left Ear: External ear normal.     Nose: Nose normal.     Mouth/Throat:     Mouth:  Mucous membranes are moist.  Eyes:     Extraocular Movements: Extraocular movements intact.     Conjunctiva/sclera: Conjunctivae normal.  Cardiovascular:     Rate and Rhythm: Normal rate and regular rhythm.  Pulmonary:     Effort: Pulmonary effort is normal. No respiratory distress.  Abdominal:     General: There is no distension.     Palpations: Abdomen is soft.     Tenderness: There is no abdominal tenderness.  Musculoskeletal:        General: Signs of injury present.     Cervical back: Normal range of motion and neck supple.  Skin:    General: Skin is warm and dry.     Coloration: Skin is not jaundiced or pale.  Neurological:     General: No focal deficit present.     Mental Status: He is alert and oriented to person, place, and time.     Cranial Nerves: No cranial  nerve deficit.     Sensory: No sensory deficit.     Motor: No weakness.     Coordination: Coordination normal.  Psychiatric:        Mood and Affect: Mood normal.        Behavior: Behavior normal.        (all labs ordered are listed, but only abnormal results are displayed) Labs Reviewed  CBC WITH DIFFERENTIAL/PLATELET - Abnormal; Notable for the following components:      Result Value   RBC 4.16 (*)    HCT 38.7 (*)    All other components within normal limits  BASIC METABOLIC PANEL WITH GFR - Abnormal; Notable for the following components:   Glucose, Bld 101 (*)    Calcium  8.8 (*)    All other components within normal limits    EKG: None  Radiology: DG Hand 2 View Left Result Date: 10/06/2024 CLINICAL DATA:  Finger injury. Fifth digit crush between would and stove. EXAM: LEFT HAND - 2 VIEW COMPARISON:  02/02/2024. FINDINGS: There is a nondisplaced fracture of the tuft of the distal phalanx of the fifth digit. No dislocation is seen. There is bony deformity of the proximal phalanx of the first digit, compatible with old healed fracture. There is redemonstration multiple radiopaque densities about the first digit and second metacarpal phalangeal joint. Calcification of the TFCC is noted. Vascular calcifications are seen in the soft tissues. IMPRESSION: 1. Nondisplaced fracture of the tuft of the distal phalanx of the fifth digit. 2. Old healed fracture of the proximal phalanx of the first digit. 3. Stable scattered radiopaque foreign bodies in the soft tissues. Electronically Signed   By: Leita Birmingham M.D.   On: 10/06/2024 16:56     Procedures   Medications Ordered in the ED  Tdap (ADACEL ) injection 0.5 mL (0.5 mLs Intramuscular Given 10/06/24 1605)  ceFAZolin  (ANCEF ) IVPB 2g/100 mL premix (0 g Intravenous Stopped 10/06/24 1634)  lidocaine  (PF) (XYLOCAINE ) 1 % injection 5 mL (5 mLs Intradermal Given by Other 10/06/24 1743)                                    Medical Decision  Making Amount and/or Complexity of Data Reviewed Labs: ordered. Radiology: ordered.  Risk Prescription drug management.   Patient presenting for injury to distal aspect of fifth digit on left hand.  On arrival in the ED, he is overall well-appearing.  Area of injury is wrapped with gauze.  He denies any significant pain at this time.  He did take Tylenol  prior to arrival.  He declines any pain medicine.  Given deep tissue wound with likely open fracture to distal aspect of his fifth digit, Ancef  and Tdap were ordered.  X-ray imaging shows fracture of Test of distal phalanx of his injured finger.  Patient underwent digital block of his fifth digit.  Wound was irrigated with 1 L of saline.  Wound remains hemostatic.  Xeroform and Kerlix dressing was applied.  Patient was prescribed ongoing Keflex  and advised to follow-up with hand surgeon.  He is stable for discharge at this time.     Final diagnoses:  Nondisplaced fracture of distal phalanx of left little finger, initial encounter for open fracture    ED Discharge Orders          Ordered    cephALEXin  (KEFLEX ) 500 MG capsule  4 times daily        10/06/24 1810               Melvenia Motto, MD 10/06/24 1812  "

## 2024-10-06 NOTE — Discharge Instructions (Addendum)
 You received your initial dose of antibiotics in the emergency department.  A prescription for ongoing antibiotics was sent to your pharmacy.  This is to avoid infection.  Take as prescribed.  You will need to see the hand surgeon.  He will likely need to do revision on your finger wound.  Call the telephone number below to set up that appointment.  Take Tylenol  as needed for pain.  Elevate your finger when possible.  Return to the emergency department for any new or worsening symptoms of concern.

## 2025-06-28 ENCOUNTER — Ambulatory Visit
